# Patient Record
Sex: Male | Born: 1960 | Race: White | Hispanic: No | Marital: Single | Smoking: Never smoker
Health system: Southern US, Community
[De-identification: ages and names within clinical notes are randomized; demographics above are authoritative.]

## PROBLEM LIST (undated history)

## (undated) DIAGNOSIS — R945 Abnormal results of liver function studies: Secondary | ICD-10-CM

## (undated) DIAGNOSIS — E669 Obesity, unspecified: Secondary | ICD-10-CM

## (undated) DIAGNOSIS — R7989 Other specified abnormal findings of blood chemistry: Secondary | ICD-10-CM

## (undated) DIAGNOSIS — T7840XA Allergy, unspecified, initial encounter: Secondary | ICD-10-CM

## (undated) DIAGNOSIS — E785 Hyperlipidemia, unspecified: Secondary | ICD-10-CM

## (undated) DIAGNOSIS — I10 Essential (primary) hypertension: Secondary | ICD-10-CM

## (undated) HISTORY — DX: Abnormal results of liver function studies: R94.5

## (undated) HISTORY — DX: Hyperlipidemia, unspecified: E78.5

## (undated) HISTORY — DX: Obesity, unspecified: E66.9

## (undated) HISTORY — DX: Essential (primary) hypertension: I10

## (undated) HISTORY — DX: Allergy, unspecified, initial encounter: T78.40XA

## (undated) HISTORY — DX: Other specified abnormal findings of blood chemistry: R79.89

---

## 2005-09-14 ENCOUNTER — Encounter (INDEPENDENT_AMBULATORY_CARE_PROVIDER_SITE_OTHER): Payer: Self-pay | Admitting: Family Medicine

## 2006-01-30 ENCOUNTER — Encounter (INDEPENDENT_AMBULATORY_CARE_PROVIDER_SITE_OTHER): Payer: Self-pay | Admitting: Family Medicine

## 2006-02-01 ENCOUNTER — Ambulatory Visit: Payer: Self-pay | Admitting: Family Medicine

## 2006-02-02 ENCOUNTER — Encounter (INDEPENDENT_AMBULATORY_CARE_PROVIDER_SITE_OTHER): Payer: Self-pay | Admitting: Family Medicine

## 2006-03-02 ENCOUNTER — Ambulatory Visit: Payer: Self-pay | Admitting: Family Medicine

## 2006-03-12 ENCOUNTER — Encounter (INDEPENDENT_AMBULATORY_CARE_PROVIDER_SITE_OTHER): Payer: Self-pay | Admitting: Family Medicine

## 2006-03-12 LAB — CONVERTED CEMR LAB: RBC count: 4.97 10*6/uL

## 2006-03-26 ENCOUNTER — Encounter: Payer: Self-pay | Admitting: Family Medicine

## 2006-03-26 DIAGNOSIS — I10 Essential (primary) hypertension: Secondary | ICD-10-CM | POA: Insufficient documentation

## 2006-03-26 DIAGNOSIS — E669 Obesity, unspecified: Secondary | ICD-10-CM | POA: Insufficient documentation

## 2006-03-26 DIAGNOSIS — J309 Allergic rhinitis, unspecified: Secondary | ICD-10-CM | POA: Insufficient documentation

## 2006-03-26 DIAGNOSIS — E785 Hyperlipidemia, unspecified: Secondary | ICD-10-CM | POA: Insufficient documentation

## 2006-03-30 ENCOUNTER — Ambulatory Visit: Payer: Self-pay | Admitting: Family Medicine

## 2006-04-29 ENCOUNTER — Ambulatory Visit: Payer: Self-pay | Admitting: Family Medicine

## 2006-06-28 ENCOUNTER — Ambulatory Visit: Payer: Self-pay | Admitting: Family Medicine

## 2006-06-29 ENCOUNTER — Telehealth (INDEPENDENT_AMBULATORY_CARE_PROVIDER_SITE_OTHER): Payer: Self-pay | Admitting: *Deleted

## 2006-06-29 ENCOUNTER — Encounter (INDEPENDENT_AMBULATORY_CARE_PROVIDER_SITE_OTHER): Payer: Self-pay | Admitting: Family Medicine

## 2006-06-29 LAB — CONVERTED CEMR LAB
Creatinine, Urine: 187 mg/dL
Microalb Creat Ratio: 7.1 mg/g (ref 0.0–30.0)
Microalb, Ur: 1.32 mg/dL (ref 0.00–1.89)

## 2006-07-05 ENCOUNTER — Telehealth (INDEPENDENT_AMBULATORY_CARE_PROVIDER_SITE_OTHER): Payer: Self-pay | Admitting: Family Medicine

## 2006-07-27 ENCOUNTER — Telehealth (INDEPENDENT_AMBULATORY_CARE_PROVIDER_SITE_OTHER): Payer: Self-pay | Admitting: Family Medicine

## 2006-09-03 ENCOUNTER — Ambulatory Visit: Payer: Self-pay | Admitting: Family Medicine

## 2006-09-03 DIAGNOSIS — F17200 Nicotine dependence, unspecified, uncomplicated: Secondary | ICD-10-CM

## 2006-09-03 LAB — CONVERTED CEMR LAB
Cholesterol, target level: 200 mg/dL
LDL Goal: 100 mg/dL

## 2006-12-06 ENCOUNTER — Ambulatory Visit: Payer: Self-pay | Admitting: Family Medicine

## 2006-12-17 ENCOUNTER — Encounter (INDEPENDENT_AMBULATORY_CARE_PROVIDER_SITE_OTHER): Payer: Self-pay | Admitting: Family Medicine

## 2006-12-19 LAB — CONVERTED CEMR LAB
BUN: 18 mg/dL (ref 6–23)
CO2: 24 meq/L (ref 19–32)
Chloride: 104 meq/L (ref 96–112)
Glucose, Bld: 96 mg/dL (ref 70–99)
Potassium: 3.9 meq/L (ref 3.5–5.3)
Sodium: 141 meq/L (ref 135–145)

## 2007-01-17 ENCOUNTER — Ambulatory Visit: Payer: Self-pay | Admitting: Family Medicine

## 2007-01-19 ENCOUNTER — Telehealth (INDEPENDENT_AMBULATORY_CARE_PROVIDER_SITE_OTHER): Payer: Self-pay | Admitting: *Deleted

## 2007-03-08 ENCOUNTER — Encounter (INDEPENDENT_AMBULATORY_CARE_PROVIDER_SITE_OTHER): Payer: Self-pay | Admitting: Family Medicine

## 2007-03-10 LAB — CONVERTED CEMR LAB
AST: 19 units/L (ref 0–37)
Alkaline Phosphatase: 70 units/L (ref 39–117)
Basophils Relative: 1 % (ref 0–1)
Eosinophils Absolute: 0.1 10*3/uL (ref 0.0–0.7)
Eosinophils Relative: 2 % (ref 0–5)
Glucose, Bld: 129 mg/dL — ABNORMAL HIGH (ref 70–99)
HCT: 47.2 % (ref 39.0–52.0)
HDL: 39 mg/dL — ABNORMAL LOW (ref >39)
LDL Cholesterol: 84 mg/dL (ref 0–99)
Lymphs Abs: 2.1 10*3/uL (ref 0.7–3.3)
MCHC: 32.6 g/dL (ref 30.0–36.0)
MCV: 92 fL (ref 78.0–100.0)
Platelets: 210 10*3/uL (ref 150–400)
RDW: 13.4 % (ref 11.5–14.0)
Sodium: 142 meq/L (ref 135–145)
Total Bilirubin: 0.6 mg/dL (ref 0.3–1.2)
Total Protein: 7.5 g/dL (ref 6.0–8.3)
Triglycerides: 160 mg/dL — ABNORMAL HIGH (ref <150)
VLDL: 32 mg/dL (ref 0–40)
WBC: 5.3 10*3/uL (ref 4.0–10.5)

## 2007-03-21 ENCOUNTER — Telehealth (INDEPENDENT_AMBULATORY_CARE_PROVIDER_SITE_OTHER): Payer: Self-pay | Admitting: *Deleted

## 2007-03-24 ENCOUNTER — Ambulatory Visit: Payer: Self-pay | Admitting: Family Medicine

## 2007-06-23 ENCOUNTER — Ambulatory Visit: Payer: Self-pay | Admitting: Family Medicine

## 2007-06-24 ENCOUNTER — Telehealth (INDEPENDENT_AMBULATORY_CARE_PROVIDER_SITE_OTHER): Payer: Self-pay | Admitting: *Deleted

## 2007-06-24 ENCOUNTER — Encounter (INDEPENDENT_AMBULATORY_CARE_PROVIDER_SITE_OTHER): Payer: Self-pay | Admitting: Family Medicine

## 2007-06-24 LAB — CONVERTED CEMR LAB
BUN: 13 mg/dL (ref 6–23)
Basophils Relative: 0 % (ref 0–1)
Creatinine, Ser: 0.99 mg/dL (ref 0.40–1.50)
Eosinophils Absolute: 0.1 10*3/uL (ref 0.0–0.7)
Eosinophils Relative: 2 % (ref 0–5)
Hemoglobin: 14.5 g/dL (ref 13.0–17.0)
MCHC: 32.6 g/dL (ref 30.0–36.0)
MCV: 93.1 fL (ref 78.0–100.0)
Monocytes Absolute: 0.5 10*3/uL (ref 0.1–1.0)
Monocytes Relative: 7 % (ref 3–12)
RBC: 4.78 M/uL (ref 4.22–5.81)
TSH: 1.229 microintl units/mL (ref 0.350–5.50)

## 2007-06-27 ENCOUNTER — Encounter (INDEPENDENT_AMBULATORY_CARE_PROVIDER_SITE_OTHER): Payer: Self-pay | Admitting: Family Medicine

## 2007-07-28 ENCOUNTER — Telehealth (INDEPENDENT_AMBULATORY_CARE_PROVIDER_SITE_OTHER): Payer: Self-pay | Admitting: Family Medicine

## 2007-09-22 ENCOUNTER — Ambulatory Visit: Payer: Self-pay | Admitting: Family Medicine

## 2007-09-22 LAB — CONVERTED CEMR LAB: Hgb A1c MFr Bld: 6.6 %

## 2007-12-22 ENCOUNTER — Ambulatory Visit: Payer: Self-pay | Admitting: Family Medicine

## 2007-12-23 LAB — CONVERTED CEMR LAB
BUN: 19 mg/dL (ref 6–23)
Calcium: 9.3 mg/dL (ref 8.4–10.5)
Glucose, Bld: 218 mg/dL — ABNORMAL HIGH (ref 70–99)
Potassium: 4.2 meq/L (ref 3.5–5.3)

## 2007-12-29 ENCOUNTER — Telehealth (INDEPENDENT_AMBULATORY_CARE_PROVIDER_SITE_OTHER): Payer: Self-pay | Admitting: Family Medicine

## 2008-02-29 ENCOUNTER — Ambulatory Visit: Payer: Self-pay | Admitting: Family Medicine

## 2008-03-15 ENCOUNTER — Ambulatory Visit: Payer: Self-pay | Admitting: Family Medicine

## 2008-03-15 LAB — CONVERTED CEMR LAB: Glucose, Bld: 149 mg/dL

## 2008-03-20 ENCOUNTER — Encounter (INDEPENDENT_AMBULATORY_CARE_PROVIDER_SITE_OTHER): Payer: Self-pay | Admitting: Family Medicine

## 2008-03-22 ENCOUNTER — Telehealth (INDEPENDENT_AMBULATORY_CARE_PROVIDER_SITE_OTHER): Payer: Self-pay | Admitting: Family Medicine

## 2008-03-27 LAB — CONVERTED CEMR LAB
ALT: 28 units/L (ref 0–53)
BUN: 22 mg/dL (ref 6–23)
Basophils Absolute: 0 10*3/uL (ref 0.0–0.1)
CO2: 22 meq/L (ref 19–32)
Cholesterol: 185 mg/dL (ref 0–200)
Creatinine, Ser: 0.98 mg/dL (ref 0.40–1.50)
Eosinophils Relative: 2 % (ref 0–5)
Glucose, Bld: 118 mg/dL — ABNORMAL HIGH (ref 70–99)
HCT: 44.7 % (ref 39.0–52.0)
HDL: 45 mg/dL (ref >39)
Hemoglobin: 14.4 g/dL (ref 13.0–17.0)
Lymphocytes Relative: 38 % (ref 12–46)
Microalb Creat Ratio: 6.5 mg/g (ref 0.0–30.0)
Monocytes Absolute: 0.7 10*3/uL (ref 0.1–1.0)
Monocytes Relative: 10 % (ref 3–12)
RDW: 12.8 % (ref 11.5–15.5)
Total Bilirubin: 0.6 mg/dL (ref 0.3–1.2)
Total CHOL/HDL Ratio: 4.1
Triglycerides: 138 mg/dL (ref <150)
VLDL: 28 mg/dL (ref 0–40)

## 2008-05-03 ENCOUNTER — Ambulatory Visit: Payer: Self-pay | Admitting: Family Medicine

## 2008-06-14 ENCOUNTER — Ambulatory Visit: Payer: Self-pay | Admitting: Family Medicine

## 2008-06-21 ENCOUNTER — Encounter (INDEPENDENT_AMBULATORY_CARE_PROVIDER_SITE_OTHER): Payer: Self-pay | Admitting: Family Medicine

## 2008-06-21 ENCOUNTER — Ambulatory Visit (HOSPITAL_COMMUNITY): Admission: RE | Admit: 2008-06-21 | Discharge: 2008-06-21 | Payer: Self-pay | Admitting: Family Medicine

## 2008-06-21 ENCOUNTER — Ambulatory Visit: Payer: Self-pay | Admitting: Cardiology

## 2008-06-22 LAB — CONVERTED CEMR LAB
Albumin: 4.8 g/dL (ref 3.5–5.2)
BUN: 22 mg/dL (ref 6–23)
CO2: 25 meq/L (ref 19–32)
Calcium: 9.6 mg/dL (ref 8.4–10.5)
Chloride: 101 meq/L (ref 96–112)
Cholesterol: 161 mg/dL (ref 0–200)
Creatinine, Ser: 1.2 mg/dL (ref 0.40–1.50)
HDL: 44 mg/dL (ref >39)
Potassium: 4.4 meq/L (ref 3.5–5.3)
Total CHOL/HDL Ratio: 3.7

## 2008-07-26 ENCOUNTER — Ambulatory Visit: Payer: Self-pay | Admitting: Family Medicine

## 2008-08-23 ENCOUNTER — Ambulatory Visit: Payer: Self-pay | Admitting: Family Medicine

## 2008-08-23 LAB — CONVERTED CEMR LAB: Hgb A1c MFr Bld: 6.8 %

## 2008-11-28 ENCOUNTER — Ambulatory Visit: Payer: Self-pay | Admitting: Family Medicine

## 2008-11-28 DIAGNOSIS — E1165 Type 2 diabetes mellitus with hyperglycemia: Secondary | ICD-10-CM

## 2008-11-28 LAB — CONVERTED CEMR LAB: Glucose, Bld: 191 mg/dL

## 2009-01-10 ENCOUNTER — Ambulatory Visit: Payer: Self-pay | Admitting: Family Medicine

## 2009-01-11 ENCOUNTER — Encounter (INDEPENDENT_AMBULATORY_CARE_PROVIDER_SITE_OTHER): Payer: Self-pay | Admitting: Family Medicine

## 2009-01-11 LAB — CONVERTED CEMR LAB
Albumin: 4.9 g/dL (ref 3.5–5.2)
Alkaline Phosphatase: 51 units/L (ref 39–117)
BUN: 21 mg/dL (ref 6–23)
Calcium: 9.6 mg/dL (ref 8.4–10.5)
Creatinine, Ser: 0.89 mg/dL (ref 0.40–1.50)
Glucose, Bld: 113 mg/dL — ABNORMAL HIGH (ref 70–99)
HDL: 42 mg/dL (ref >39)
LDL Cholesterol: 71 mg/dL (ref 0–99)
Potassium: 4.2 meq/L (ref 3.5–5.3)
Triglycerides: 98 mg/dL (ref <150)

## 2009-02-05 ENCOUNTER — Encounter (INDEPENDENT_AMBULATORY_CARE_PROVIDER_SITE_OTHER): Payer: Self-pay | Admitting: Family Medicine

## 2009-02-13 ENCOUNTER — Encounter (INDEPENDENT_AMBULATORY_CARE_PROVIDER_SITE_OTHER): Payer: Self-pay | Admitting: Family Medicine

## 2009-12-29 ENCOUNTER — Emergency Department (HOSPITAL_BASED_OUTPATIENT_CLINIC_OR_DEPARTMENT_OTHER): Admission: EM | Admit: 2009-12-29 | Discharge: 2009-12-29 | Payer: Self-pay | Admitting: Emergency Medicine

## 2010-08-08 LAB — DIFFERENTIAL
Basophils Absolute: 0 10*3/uL (ref 0.0–0.1)
Basophils Relative: 0 % (ref 0–1)
Eosinophils Relative: 2 % (ref 0–5)
Monocytes Absolute: 0.5 10*3/uL (ref 0.1–1.0)

## 2010-08-08 LAB — CBC
HCT: 41 % (ref 39.0–52.0)
MCHC: 34.1 g/dL (ref 30.0–36.0)
MCV: 92 fL (ref 78.0–100.0)
RDW: 12.5 % (ref 11.5–15.5)

## 2010-08-08 LAB — BASIC METABOLIC PANEL
BUN: 24 mg/dL — ABNORMAL HIGH (ref 6–23)
Chloride: 101 mEq/L (ref 96–112)
Glucose, Bld: 163 mg/dL — ABNORMAL HIGH (ref 70–99)
Potassium: 4.3 mEq/L (ref 3.5–5.1)

## 2010-08-08 LAB — POCT CARDIAC MARKERS
CKMB, poc: 1.1 ng/mL (ref 1.0–8.0)
CKMB, poc: 1.9 ng/mL (ref 1.0–8.0)
Myoglobin, poc: 52.9 ng/mL (ref 12–200)
Troponin i, poc: <0.05 ng/mL (ref 0.00–0.09)

## 2010-12-25 ENCOUNTER — Encounter: Payer: Self-pay | Admitting: Family Medicine

## 2011-04-06 ENCOUNTER — Observation Stay (HOSPITAL_COMMUNITY)
Admission: EM | Admit: 2011-04-06 | Discharge: 2011-04-07 | Disposition: A | Discharge status: Home / Self Care | Payer: PRIVATE HEALTH INSURANCE | Attending: Internal Medicine | Admitting: Internal Medicine

## 2011-04-06 ENCOUNTER — Other Ambulatory Visit: Payer: Self-pay

## 2011-04-06 ENCOUNTER — Encounter (HOSPITAL_COMMUNITY): Payer: Self-pay | Admitting: Emergency Medicine

## 2011-04-06 ENCOUNTER — Emergency Department (HOSPITAL_COMMUNITY): Payer: PRIVATE HEALTH INSURANCE

## 2011-04-06 DIAGNOSIS — I10 Essential (primary) hypertension: Secondary | ICD-10-CM | POA: Insufficient documentation

## 2011-04-06 DIAGNOSIS — R42 Dizziness and giddiness: Secondary | ICD-10-CM | POA: Insufficient documentation

## 2011-04-06 DIAGNOSIS — R002 Palpitations: Secondary | ICD-10-CM | POA: Insufficient documentation

## 2011-04-06 DIAGNOSIS — E119 Type 2 diabetes mellitus without complications: Secondary | ICD-10-CM | POA: Insufficient documentation

## 2011-04-06 DIAGNOSIS — R0789 Other chest pain: Principal | ICD-10-CM | POA: Insufficient documentation

## 2011-04-06 LAB — COMPREHENSIVE METABOLIC PANEL
Alkaline Phosphatase: 71 U/L (ref 39–117)
BUN: 16 mg/dL (ref 6–23)
Chloride: 99 mEq/L (ref 96–112)
Creatinine, Ser: 0.86 mg/dL (ref 0.50–1.35)
GFR calc Af Amer: >90 mL/min (ref >90)
GFR calc non Af Amer: >90 mL/min (ref >90)
Glucose, Bld: 103 mg/dL — ABNORMAL HIGH (ref 70–99)
Potassium: 3.9 mEq/L (ref 3.5–5.1)
Total Bilirubin: 0.5 mg/dL (ref 0.3–1.2)

## 2011-04-06 LAB — POCT I-STAT TROPONIN I: Troponin i, poc: 0 ng/mL (ref 0.00–0.08)

## 2011-04-06 LAB — CBC
HCT: 39.7 % (ref 39.0–52.0)
Hemoglobin: 13.2 g/dL (ref 13.0–17.0)
MCH: 30.6 pg (ref 26.0–34.0)
RBC: 4.31 MIL/uL (ref 4.22–5.81)

## 2011-04-06 LAB — DIFFERENTIAL
Lymphs Abs: 2 10*3/uL (ref 0.7–4.0)
Monocytes Absolute: 0.9 10*3/uL (ref 0.1–1.0)
Monocytes Relative: 9 % (ref 3–12)
Neutro Abs: 6.1 10*3/uL (ref 1.7–7.7)
Neutrophils Relative %: 68 % (ref 43–77)

## 2011-04-06 NOTE — ED Provider Notes (Signed)
History   This chart was scribed for Kyle Lennert, MD by Clarita Crane. The patient was seen in room APA10/APA10 and the patient's care was started at 7:20PM.   CSN: [id number] Arrival date & time: 04/06/2011  6:36 PM   First MD Initiated Contact with Patient 04/06/11 1916      Chief Complaint  Patient presents with  . Chest Pain  . Weakness   HPI DARLY FAILS is a 50 y.o. male who presents to the Emergency Department complaining of sudden onset of moderate non-radiating chest pain with associated weakness, dizziness described as lightheadedness and palpitations which started approximately 3.5 hours ago and gradually improving since. States palpitations have currently resolved but notes dizziness persists and is worse with standing. Patient states he had his blood pressure measured following onset of chest pain which was 140/80. Denies diaphoresis, fever, nausea, vomiting, diarrhea. Patient with h/o hypertension, diabetes, hyperlipidemia. Notes father died of MI at age of 34.   PCP- Dr. Tanya Nones  Past Medical History  Diagnosis Date  . Hypertension   . Diabetes mellitus   . Hyperlipidemia     History reviewed. No pertinent past surgical history.  History reviewed. No pertinent family history.  History  Substance Use Topics  . Smoking status: Not on file  . Smokeless tobacco: Current User  . Alcohol Use: No      Review of Systems  Constitutional: Negative for fatigue.  HENT: Negative for congestion, sinus pressure and ear discharge.   Eyes: Negative for discharge.  Respiratory: Negative for cough.   Cardiovascular: Negative for chest pain.  Gastrointestinal: Negative for abdominal pain and diarrhea.  Genitourinary: Negative for frequency and hematuria.  Musculoskeletal: Negative for back pain.  Skin: Negative for rash.  Neurological: Negative for seizures and headaches.  Hematological: Negative.   Psychiatric/Behavioral: Negative for hallucinations.     Allergies  Review of patient's allergies indicates no known allergies.  Home Medications   Current Outpatient Rx  Name Route Sig Dispense Refill  . ASPIRIN 81 MG PO TABS Oral Take 81 mg by mouth at bedtime.     Marland Kitchen VITAMIN D 1000 UNITS PO TABS Oral Take 1,000 Units by mouth at bedtime.      . OMEGA-3 FATTY ACIDS 1000 MG PO CAPS Oral Take 1 g by mouth 2 (two) times daily.     Marland Kitchen GLIPIZIDE 5 MG PO TABS Oral Take 10 mg by mouth 2 (two) times daily before a meal.     . LISINOPRIL 20 MG PO TABS Oral Take 20 mg by mouth daily.      Marland Kitchen METFORMIN HCL 1000 MG PO TABS Oral Take 1,000 mg by mouth 2 (two) times daily with a meal.      . OXYMETAZOLINE HCL 0.05 % NA SOLN Nasal Place 2 sprays into the nose once. For nasal congestion     . PRAVASTATIN SODIUM 20 MG PO TABS Oral Take 20 mg by mouth daily.      Marland Kitchen VITAMIN B-6 100 MG PO TABS Oral Take 100 mg by mouth daily.      Marland Kitchen SITAGLIPTIN PHOSPHATE 100 MG PO TABS Oral Take 100 mg by mouth every evening.     Marland Kitchen VITAMIN C 500 MG PO TABS Oral Take 500 mg by mouth at bedtime.        BP 120/76  Pulse 74  Temp(Src) 97.5 F (36.4 C) (Oral)  Resp 20  Ht 5\' 10"  (1.778 m)  Wt 225 lb (102.059 kg)  BMI  32.28 kg/m2  SpO2 100%  Physical Exam  Nursing note and vitals reviewed. Constitutional: He is oriented to person, place, and time. He appears well-developed and well-nourished. No distress.  HENT:  Head: Normocephalic and atraumatic.  Eyes: EOM are normal. Pupils are equal, round, and reactive to light.  Neck: Neck supple.  Cardiovascular: Normal rate, regular rhythm and normal heart sounds.  Exam reveals no gallop and no friction rub.   No murmur heard. Pulmonary/Chest: Effort normal and breath sounds normal. No respiratory distress. He has no wheezes.  Abdominal: Soft. Bowel sounds are normal. He exhibits no distension. There is no tenderness.  Musculoskeletal: Normal range of motion. He exhibits no edema.  Neurological: He is alert and oriented to  person, place, and time. No sensory deficit.  Skin: Skin is warm and dry.  Psychiatric: He has a normal mood and affect. His behavior is normal.    ED Course  Procedures  DIAGNOSTIC STUDIES: Oxygen Saturation is 100% on room air, normal by my interpretation.     Date: 04/06/2011  Rate: 60  Rhythm: normal sinus rhythm  QRS Axis: normal  Intervals: normal  ST/T Wave abnormalities: normal  Conduction Disutrbances:none  Narrative Interpretation:   Old EKG Reviewed: none available   COORDINATION OF CARE: 10:17PM- Patient informed of current lab and imaging results. Orthostatic vital signs reviewed and explained to patient. Patient advised of intent to admit. Patient agrees with plan set forth at this time.  10:25PM- Consult complete with hospitalist. Patient case explained and discussed. Hospitalist agrees to admit patient for further evaluation and treatment.   Labs Reviewed  COMPREHENSIVE METABOLIC PANEL - Abnormal; Notable for the following:    Glucose, Bld 103 (*)    AST 43 (*)    ALT 60 (*)    All other components within normal limits  CBC  DIFFERENTIAL  POCT I-STAT TROPONIN I  I-STAT TROPONIN I   Dg Chest 2 View  04/06/2011  *RADIOLOGY REPORT*  Clinical Data: Chest pain  CHEST - 2 VIEW  Comparison: 06/21/2008  Findings: Heart size within normal limits.  Negative for heart failure.  Lungs are clear without infiltrate or effusion.  IMPRESSION: No active cardiopulmonary disease.  Original Report Authenticated By: Camelia Phenes, M.D.     1. Chest pain       MDM     The chart was scribed for me under my direct supervision.  I personally performed the history, physical, and medical decision making and all procedures in the evaluation of this patient.Kyle Lennert, MD 04/06/11 2236

## 2011-04-06 NOTE — ED Notes (Signed)
Pt c/o chest pain, dizziness and weakness since 4pm today.

## 2011-04-07 ENCOUNTER — Encounter (HOSPITAL_COMMUNITY): Payer: Self-pay | Admitting: *Deleted

## 2011-04-07 LAB — LIPID PANEL
Cholesterol: 132 mg/dL (ref 0–200)
VLDL: 22 mg/dL (ref 0–40)

## 2011-04-07 LAB — CARDIAC PANEL(CRET KIN+CKTOT+MB+TROPI)
CK, MB: 5.1 ng/mL — ABNORMAL HIGH (ref 0.3–4.0)
CK, MB: 4.7 ng/mL — ABNORMAL HIGH (ref 0.3–4.0)
Troponin I: <0.3 ng/mL (ref <0.30)
Troponin I: <0.3 ng/mL (ref <0.30)
Troponin I: <0.3 ng/mL (ref <0.30)

## 2011-04-07 LAB — GLUCOSE, CAPILLARY
Glucose-Capillary: 198 mg/dL — ABNORMAL HIGH (ref 70–99)
Glucose-Capillary: 139 mg/dL — ABNORMAL HIGH (ref 70–99)

## 2011-04-07 LAB — TSH: TSH: 0.879 u[IU]/mL (ref 0.350–4.500)

## 2011-04-07 MED ORDER — SODIUM CHLORIDE 0.9 % IJ SOLN
3.0000 mL | Freq: Two times a day (BID) | INTRAMUSCULAR | Status: DC
  Administered 2011-04-07: 3 mL via INTRAVENOUS
  Filled 2011-04-07: qty 3

## 2011-04-07 MED ORDER — VITAMIN D3 25 MCG (1000 UNIT) PO TABS
1000.0000 [IU] | ORAL_TABLET | Freq: Every day | ORAL | Status: DC
  Filled 2011-04-07: qty 1

## 2011-04-07 MED ORDER — GLIPIZIDE 5 MG PO TABS
10.0000 mg | ORAL_TABLET | Freq: Two times a day (BID) | ORAL | Status: DC
  Administered 2011-04-07: 10 mg via ORAL
  Filled 2011-04-07: qty 2

## 2011-04-07 MED ORDER — ASPIRIN 300 MG RE SUPP
300.0000 mg | RECTAL | Status: AC
  Filled 2011-04-07: qty 1

## 2011-04-07 MED ORDER — NITROGLYCERIN 0.4 MG SL SUBL: 0.4000 mg | SUBLINGUAL_TABLET | SUBLINGUAL | Status: DC | PRN

## 2011-04-07 MED ORDER — ONDANSETRON HCL 4 MG/2ML IJ SOLN: 4.0000 mg | Freq: Four times a day (QID) | INTRAMUSCULAR | Status: DC | PRN

## 2011-04-07 MED ORDER — ASPIRIN EC 81 MG PO TBEC: 81.0000 mg | DELAYED_RELEASE_TABLET | Freq: Every day | ORAL | Status: DC

## 2011-04-07 MED ORDER — LISINOPRIL 10 MG PO TABS
20.0000 mg | ORAL_TABLET | Freq: Every day | ORAL | Status: DC
  Administered 2011-04-07: 20 mg via ORAL
  Filled 2011-04-07: qty 2

## 2011-04-07 MED ORDER — SODIUM CHLORIDE 0.9 % IJ SOLN
3.0000 mL | INTRAMUSCULAR | Status: DC | PRN
  Administered 2011-04-07: 3 mL via INTRAVENOUS
  Filled 2011-04-07: qty 3

## 2011-04-07 MED ORDER — CYCLOBENZAPRINE HCL 5 MG PO TABS: 5.0000 mg | ORAL_TABLET | Freq: Every day | ORAL | Status: AC

## 2011-04-07 MED ORDER — ASPIRIN 81 MG PO CHEW
324.0000 mg | CHEWABLE_TABLET | ORAL | Status: AC
  Administered 2011-04-07: 324 mg via ORAL
  Filled 2011-04-07: qty 3

## 2011-04-07 MED ORDER — SIMVASTATIN 10 MG PO TABS: 5.0000 mg | ORAL_TABLET | Freq: Every day | ORAL | Status: DC

## 2011-04-07 MED ORDER — ACETAMINOPHEN 325 MG PO TABS: 650.0000 mg | ORAL_TABLET | ORAL | Status: DC | PRN

## 2011-04-07 NOTE — Discharge Summary (Signed)
  Physician Discharge Summary  Patient ID: Kyle Ray MRN: [id number] DOB/AGE: 50-Jun-1962 50 y.o.  Admit date: 04/06/2011 Discharge date: 04/07/2011  Primary Care Physician:  Franchot Heidelberg, MD   Discharge Diagnoses:    1. Chest pain, likely musculoskeletal 2. HTN 3. DM type 2   Current Discharge Medication List    START taking these medications   Details  cyclobenzaprine (FLEXERIL) 5 MG tablet Take 1 tablet (5 mg total) by mouth at bedtime. Qty: 3 tablet, Refills: 0      CONTINUE these medications which have NOT CHANGED   Details  aspirin 81 MG tablet Take 81 mg by mouth at bedtime.     cholecalciferol (VITAMIN D) 1000 UNITS tablet Take 1,000 Units by mouth at bedtime.      fish oil-omega-3 fatty acids 1000 MG capsule Take 1 g by mouth 2 (two) times daily.     glipiZIDE (GLUCOTROL) 5 MG tablet Take 10 mg by mouth 2 (two) times daily before a meal.     lisinopril (PRINIVIL,ZESTRIL) 20 MG tablet Take 20 mg by mouth daily.      metFORMIN (GLUCOPHAGE) 1000 MG tablet Take 1,000 mg by mouth 2 (two) times daily with a meal.      oxymetazoline (NASAL SPRAY 12 HOUR) 0.05 % nasal spray Place 2 sprays into the nose once. For nasal congestion     pravastatin (PRAVACHOL) 20 MG tablet Take 20 mg by mouth daily.      pyridOXINE (VITAMIN B-6) 100 MG tablet Take 100 mg by mouth daily.      sitaGLIPtin (JANUVIA) 100 MG tablet Take 100 mg by mouth every evening.     vitamin C (ASCORBIC ACID) 500 MG tablet Take 500 mg by mouth at bedtime.           Disposition and Follow-up:  Patient will be discharged home today in stable and improved condition. He should schedule a followup appointment with his primary care physician in 4 weeks following discharge.  Consults:  none    Significant Diagnostic Studies:  Dg Chest 2 View  04/06/2011  *RADIOLOGY REPORT*  Clinical Data: Chest pain  CHEST - 2 VIEW  Comparison: 06/21/2008  Findings: Heart size within normal limits.   Negative for heart failure.  Lungs are clear without infiltrate or effusion.  IMPRESSION: No active cardiopulmonary disease.  Original Report Authenticated By: Camelia Phenes, M.D.    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    Hospital Course:  #1 chest pain: He has ruled out for acute coronary syndrome by the way of 3 sets of negative cardiac enzymes and EKGs that demonstrate no acute ischemic changes. Chest pain is in the axillary region and is exquisitely tender to touch. I believe that this is most likely related to a muscle spasm related to the heavy lifting that is required at his job. I will give him a muscle relaxer to take every night for 3 consecutive nights. I do not believe he needs any further cardiac workup at this time.  Time spent on Discharge: Greater than 30 minutes.  SignedChaya Jan 04/07/2011, 2:13 PM

## 2011-04-07 NOTE — Plan of Care (Signed)
Problem: Consults Goal: Diabetes Guidelines if Diabetic/Glucose > 140 If diabetic or lab glucose is > 140 mg/dl - Initiate Diabetes/Hyperglycemia Guidelines & Document Interventions  Outcome: Progressing Recommendations made.  Will continue to monitor.     

## 2011-04-07 NOTE — H&P (Signed)
PCP:   Franchot Heidelberg, MD   Chief Complaint:  cp  HPI: 50 yo male dm/htn bringing his trash to the road and started having some dizziness, left sided cp that he thought was from a pulled muscle without any radiation and having palp.  He says he went inside sat down and the sympoms resolved.  Denies and n/v/sob with it.  deneis any recent fevers/n/v/d.  Has had several episodes similar to this in the past which he thinks may be related to low blood sugar but he is not sure bc he never checks it.  Symptoms resolved at this time.  Review of Systems:  O/w neg  Past Medical History: Past Medical History  Diagnosis Date  . Hypertension   . Diabetes mellitus   . Hyperlipidemia    History reviewed. No pertinent past surgical history.  Medications: Prior to Admission medications   Medication Sig Start Date End Date Taking? Authorizing Provider  aspirin 81 MG tablet Take 81 mg by mouth at bedtime.    Yes Historical Provider, MD  cholecalciferol (VITAMIN D) 1000 UNITS tablet Take 1,000 Units by mouth at bedtime.     Yes Historical Provider, MD  fish oil-omega-3 fatty acids 1000 MG capsule Take 1 g by mouth 2 (two) times daily.    Yes Historical Provider, MD  glipiZIDE (GLUCOTROL) 5 MG tablet Take 10 mg by mouth 2 (two) times daily before a meal.    Yes Historical Provider, MD  lisinopril (PRINIVIL,ZESTRIL) 20 MG tablet Take 20 mg by mouth daily.     Yes Historical Provider, MD  metFORMIN (GLUCOPHAGE) 1000 MG tablet Take 1,000 mg by mouth 2 (two) times daily with a meal.     Yes Historical Provider, MD  oxymetazoline (NASAL SPRAY 12 HOUR) 0.05 % nasal spray Place 2 sprays into the nose once. For nasal congestion    Yes Historical Provider, MD  pravastatin (PRAVACHOL) 20 MG tablet Take 20 mg by mouth daily.     Yes Historical Provider, MD  pyridOXINE (VITAMIN B-6) 100 MG tablet Take 100 mg by mouth daily.     Yes Historical Provider, MD  sitaGLIPtin (JANUVIA) 100 MG tablet Take 100 mg by  mouth every evening.    Yes Historical Provider, MD  vitamin C (ASCORBIC ACID) 500 MG tablet Take 500 mg by mouth at bedtime.     Yes Historical Provider, MD    Allergies:  No Known Allergies  Social History:  does not have a smoking history on file. He uses smokeless tobacco. He reports that he does not drink alcohol or use illicit drugs.  Family History: History reviewed. No pertinent family history.  Physical Exam: Filed Vitals:   04/06/11 1833 04/06/11 1949 04/06/11 1951 04/06/11 1953  BP: 156/84 140/77 141/80 120/76  Pulse: 71 60 66 74  Temp: 97.5 F (36.4 C)     TempSrc: Oral     Resp: 20     Height: 5\' 10"  (1.778 m)     Weight: 102.059 kg (225 lb)     SpO2: 100%      General appearance: alert, cooperative and no distress Resp: clear to auscultation bilaterally Chest wall: no tenderness Cardio: regular rate and rhythm, S1, S2 normal, no murmur, click, rub or gallop GI: soft, non-tender; bowel sounds normal; no masses,  no organomegaly Extremities: extremities normal, atraumatic, no cyanosis or edema Pulses: 2+ and symmetric Skin: Skin color, texture, turgor normal. No rashes or lesions Neurologic: Grossly normal   Labs on Admission:  Basename 04/06/11 1935  NA 137  K 3.9  CL 99  CO2 27  GLUCOSE 103*  BUN 16  CREATININE 0.86  CALCIUM 9.9  MG --  PHOS --    Basename 04/06/11 1935  AST 43*  ALT 60*  ALKPHOS 71  BILITOT 0.5  PROT 8.1  ALBUMIN 4.8    Basename 04/06/11 1935  WBC 9.0  NEUTROABS 6.1  HGB 13.2  HCT 39.7  MCV 92.1  PLT 168   Radiological Exams on Admission: Dg Chest 2 View  04/06/2011  *RADIOLOGY REPORT*  Clinical Data: Chest pain  CHEST - 2 VIEW  Comparison: 06/21/2008  Findings: Heart size within normal limits.  Negative for heart failure.  Lungs are clear without infiltrate or effusion.  IMPRESSION: No active cardiopulmonary disease.  Original Report Authenticated By: Camelia Phenes, M.D.    Assessment/Plan Present on  Admission:  50 yo male with atypical cp, dizzy, palpitations 1.  Cp  ekg neg.  No arrythmias.  Will monitor on tele for any.  Romi, ck 2d echo and will need complete w/u with outpt stress testing due to risk factors.  obs overnight.   2.  htn stable 3.  Dm stable 4.  hld stable  Orthostatics neg in ED.    Ashleynicole Mcclees A 04/07/2011, 12:58 AM

## 2011-04-07 NOTE — Progress Notes (Signed)
UR Chart Review Completed  

## 2011-04-07 NOTE — ED Notes (Signed)
Pt CP free at this time A&O no c/os pt kept informed

## 2011-04-07 NOTE — Progress Notes (Signed)
Inpatient Diabetes Program Recommendations  AACE/ADA: New Consensus Statement on Inpatient Glycemic Control (2009)  Target Ranges:  Prepandial:   less than 140 mg/dL      Peak postprandial:   less than 180 mg/dL (1-2 hours)      Critically ill patients:  140 - 180 mg/dL   Reason for Visit: Elevated glucose:  198 mg/dL  Inpatient Diabetes Program Recommendations Correction (SSI): Add Novolog Correction HgbA1C: Check A1C to assess glycemic control.

## 2011-10-02 LAB — HEPATIC FUNCTION PANEL
ALT: 63 U/L — AB (ref 10–40)
AST: 45 U/L — AB (ref 14–40)
Alkaline Phosphatase: 57 U/L (ref 25–125)

## 2011-10-02 LAB — LIPID PANEL
HDL: 40 mg/dL (ref 35–70)
LDL Cholesterol: 92 mg/dL

## 2011-12-03 ENCOUNTER — Telehealth (HOSPITAL_COMMUNITY): Payer: Self-pay | Admitting: Dietician

## 2011-12-03 ENCOUNTER — Ambulatory Visit (INDEPENDENT_AMBULATORY_CARE_PROVIDER_SITE_OTHER): Payer: PRIVATE HEALTH INSURANCE | Admitting: Family Medicine

## 2011-12-03 ENCOUNTER — Encounter: Payer: Self-pay | Admitting: Family Medicine

## 2011-12-03 VITALS — BP 120/80 | HR 72 | Resp 16 | Ht 69.0 in | Wt 233.4 lb

## 2011-12-03 DIAGNOSIS — IMO0001 Reserved for inherently not codable concepts without codable children: Secondary | ICD-10-CM

## 2011-12-03 DIAGNOSIS — I10 Essential (primary) hypertension: Secondary | ICD-10-CM

## 2011-12-03 DIAGNOSIS — E669 Obesity, unspecified: Secondary | ICD-10-CM

## 2011-12-03 DIAGNOSIS — E785 Hyperlipidemia, unspecified: Secondary | ICD-10-CM

## 2011-12-03 NOTE — Patient Instructions (Signed)
Stop the pravastatin  Continue your other medications I will set you up with the nutritionist at [facility] Try to watch the sweets, avoid white foods " bread,pasta,potatoes, french fries, rice,cookies"  Multivitamin once a day  F/U 6 weeks - bring your meter

## 2011-12-03 NOTE — Assessment & Plan Note (Addendum)
On 4 oral meds, obtain records, concern his A1C is not well controlled. Continue for now One ACEI and aspirin

## 2011-12-03 NOTE — Assessment & Plan Note (Signed)
Nutrition referral, he works long shifts, encouraged walking

## 2011-12-03 NOTE — Telephone Encounter (Signed)
Received referral via fax from [facility] (Dr. Jeanice Lim) for dx: diabetes, obesity.

## 2011-12-03 NOTE — Assessment & Plan Note (Signed)
Well controlled continue Lisinopril

## 2011-12-03 NOTE — Assessment & Plan Note (Signed)
Continue to hold statin until records and labs reviewed, on fish oil

## 2011-12-03 NOTE — Telephone Encounter (Signed)
Appointment scheduled for Friday 12/11/11 at 10:00 AM.

## 2011-12-03 NOTE — Progress Notes (Signed)
  Subjective:    Patient ID: Kyle Ray, male    DOB: May 20, 1961, 51 y.o.   MRN: [id number]  HPI Pt here to establish care , previous PCP [facility] Medications and history reviewed Ortho- Dr. Amanda Pea , [facility] DM- diagnosed 10 years ago after routine work physical, on 4 different oral meds, takes CBG twice a week, Actos added 2 months ago Fatty liver- told he has elevated LFT, was on pravastatin has been off for about 2 months now, had labs done 4 weeks ago, trying to improve his diet, asking for nutritionist referral today Hand pain- s/p surgery for trigger finger in March of 2013 continues to have stiffness and pain in right hand, also has ? Carpal tunnel  Due for colonoscopy   Review of Systems   GEN- denies fatigue, fever, weight loss,weakness, recent illness HEENT- denies eye drainage, change in vision, nasal discharge, CVS- denies chest pain, palpitations RESP- denies SOB, cough, wheeze ABD- denies N/V, change in stools, abd pain GU- denies dysuria, hematuria, dribbling, incontinence MSK- denies joint pain, muscle aches, injury, + right hand pain  Neuro- denies headache, +dizzy episodes, syncope, seizure activity      Objective:   Physical Exam GEN- NAD, alert and oriented x3, obese HEENT- PERRL, EOMI, non injected sclera, pink conjunctiva, MMM, oropharynx clear Neck- Supple,  CVS- RRR, no murmur RESP-CTAB ABD-NABS,soft,ND,NT EXT- No edema Pulses- Radial, DP- 2+ Psych-normal affect and Mood        Assessment & Plan:

## 2011-12-04 NOTE — Telephone Encounter (Signed)
Mailed appointment confirmation letter and instructions for appointment scheduled 12/11/11 at 10 AM via [place] Mail.

## 2011-12-11 ENCOUNTER — Encounter (HOSPITAL_COMMUNITY): Payer: Self-pay | Admitting: Dietician

## 2011-12-11 NOTE — Progress Notes (Signed)
Outpatient Initial Nutrition Assessment  Date:12/11/2011   Time: 9:27 AM  Referring Physician: Dr. Jeanice Lim Reason for Visit: diabetes  Nutrition Assessment:  Height: 5\' 9"  (175.3 cm)   Weight: 230 lb (104.327 kg)   IBW: 160# %IBW: 144% UBW: 230# %UBW: 100% Body mass index is 33.96 kg/(m^2).  Goal Weight: 207# (10% weight loss) Weight hx: Pt reports his highest weight was 280# in the 1990's. He reports he was successfully able to lose weight and achieved a weight of 190#. His lowest weights were 145# in 1980, prior to joining [employer], and 175# in 1983 after leaving the [employer].   Estimated nutritional needs: 2239-2442 kcals daily, 84-105 grams protein daily, 2.2-2.4 L fluid daily  PMH:  Past Medical History  Diagnosis Date  . Hypertension   . Diabetes mellitus   . Hyperlipidemia     Medications:  Current Outpatient Rx  Name Route Sig Dispense Refill  . ASPIRIN 81 MG PO TABS Oral Take 81 mg by mouth at bedtime.     Marland Kitchen VITAMIN D 1000 UNITS PO TABS Oral Take 1,000 Units by mouth at bedtime.      . OMEGA-3 FATTY ACIDS 1000 MG PO CAPS Oral Take 1 g by mouth 2 (two) times daily.     Marland Kitchen GLIPIZIDE 5 MG PO TABS Oral Take 10 mg by mouth 2 (two) times daily before a meal.     . LISINOPRIL 20 MG PO TABS Oral Take 20 mg by mouth daily.      Marland Kitchen METFORMIN HCL 1000 MG PO TABS Oral Take 1,000 mg by mouth 2 (two) times daily with a meal.      . OXYMETAZOLINE HCL 0.05 % NA SOLN Nasal Place 2 sprays into the nose once. For nasal congestion     . PRAVASTATIN SODIUM 20 MG PO TABS Oral Take 20 mg by mouth daily.      Marland Kitchen VITAMIN B-6 100 MG PO TABS Oral Take 100 mg by mouth daily.      Marland Kitchen SITAGLIPTIN PHOSPHATE 100 MG PO TABS Oral Take 100 mg by mouth every evening.     Marland Kitchen VITAMIN C 500 MG PO TABS Oral Take 500 mg by mouth at bedtime.        Labs: CMP     Component Value Date/Time   NA 137 04/06/2011 1935   K 3.9 04/06/2011 1935   CL 99 04/06/2011 1935   CO2 27 04/06/2011 1935   GLUCOSE 103*  04/06/2011 1935   BUN 16 04/06/2011 1935   CREATININE 0.86 04/06/2011 1935   CALCIUM 9.9 04/06/2011 1935   PROT 8.1 04/06/2011 1935   ALBUMIN 4.8 04/06/2011 1935   AST 43* 04/06/2011 1935   ALT 60* 04/06/2011 1935   ALKPHOS 71 04/06/2011 1935   BILITOT 0.5 04/06/2011 1935   GFRNONAA >90 04/06/2011 1935   GFRAA >90 04/06/2011 1935    Lipid Panel     Component Value Date/Time   CHOL 132 04/07/2011 0217   TRIG 112 04/07/2011 0217   HDL 41 04/07/2011 0217   CHOLHDL 3.2 04/07/2011 0217   VLDL 22 04/07/2011 0217   LDLCALC 69 04/07/2011 0217     Lab Results  Component Value Date   HGBA1C 8.3 11/28/2008   HGBA1C 6.8 08/23/2008   HGBA1C 6.9 06/14/2008   Lab Results  Component Value Date   MICROALBUR 1.82 03/20/2008   LDLCALC 69 04/07/2011   CREATININE 0.86 04/06/2011     Lifestyle/ social habits: Mr. Mutch lives in [place]  with a roommate. 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 He has been divorced twice. He reports his stress level at a 7, citing depression and health concerns as his main sources of stress. He does not currently participate in physical activity outside of work. 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   Nutrition hx/habits: Mr. Carrick reports that he has not changed his eating habits recently. He reports he gave up pizza 5 months ago and he started ordering salads instead of fries. He drinks mostly water or diet soda. He has a pretty consistent meal schedule. He bring his meals from home to work. He reports he will often eat out on his days off. He is interested in knowing how many sugars are in different types of food. He is adamant that his doctors told him not to eat potato or corn products and that he needs to eat foods that contain less than 6 grams of sugar. He checks his blood sugars twice a week and readings averages under 120. He reports he only checks his blood sugars when he is  off, because he does not have time at work to check them. He reports he has tried counting carbs in the past and that does not work for him.   Diet recall: breakfast (4:15 AM): banana, low sugar quaker granola bar; 7:oo AM: 1 sandwich, sugar free pudding, potato chips; 10:30 Am: sandwich, bag of chips, grape tomatoes; Dinner (6:00 PM): frozen fish and steamed vegetables  Nutrition Diagnosis: Nutrition-related knowledge deficit r/t diabetes AEB pt with multiple diet related questions.   Nutrition Intervention: Nutrition rx:1800 kcal NAS, diabetic diet; 3 meals per day (60-75 grams carbohydrates per meal); 1-2 snack daily (15-30 grams carbohydrate per snack); low calorie beverages only; physical activity as tolerated  Education/Counseling Provided: Educated pt on diabetic diet. Emphasized plate method, portion control, and sources of carbohydrate. Discussed nutritional content of commonly eaten foods and suggested healthier alternatives, particularly at restaurants. Discussed snack ideas. Reviewed food label reading. Provided plate method, whole grains, and carb counting and meal planning handouts.  Understanding, Motivation, Ability to Follow Recommendations: Expect fair compliance.  Monitoring and Evaluation: Goals: 1) 1-2# weight loss per week; 2) Hgb A1c <7.0; 3) Physical activity as tolerated  Recommendations: 1) For weight loss: 1739-1942 kcals daily; 2) Try to exercise on days off; 3) Bring pre-portioned snacks (ex. Yogurt, fruit cups) to ensure proper portion sizes  F/U: PRN. Provided RD contact information.   Melody Haver, RD, LDN 12/11/2011  Appt End Time: 11:00 AM

## 2012-01-14 ENCOUNTER — Encounter: Payer: Self-pay | Admitting: Family Medicine

## 2012-01-14 ENCOUNTER — Ambulatory Visit (INDEPENDENT_AMBULATORY_CARE_PROVIDER_SITE_OTHER): Payer: PRIVATE HEALTH INSURANCE | Admitting: Family Medicine

## 2012-01-14 VITALS — BP 110/78 | HR 65 | Resp 16 | Ht 69.0 in | Wt 233.0 lb

## 2012-01-14 DIAGNOSIS — I10 Essential (primary) hypertension: Secondary | ICD-10-CM

## 2012-01-14 DIAGNOSIS — R7989 Other specified abnormal findings of blood chemistry: Secondary | ICD-10-CM

## 2012-01-14 DIAGNOSIS — IMO0001 Reserved for inherently not codable concepts without codable children: Secondary | ICD-10-CM

## 2012-01-14 DIAGNOSIS — E785 Hyperlipidemia, unspecified: Secondary | ICD-10-CM

## 2012-01-14 DIAGNOSIS — E669 Obesity, unspecified: Secondary | ICD-10-CM

## 2012-01-14 DIAGNOSIS — E119 Type 2 diabetes mellitus without complications: Secondary | ICD-10-CM

## 2012-01-14 LAB — COMPREHENSIVE METABOLIC PANEL
AST: 33 U/L (ref 0–37)
Albumin: 4.7 g/dL (ref 3.5–5.2)
Alkaline Phosphatase: 58 U/L (ref 39–117)
BUN: 17 mg/dL (ref 6–23)
Creat: 1.03 mg/dL (ref 0.50–1.35)
Potassium: 4.3 mEq/L (ref 3.5–5.3)
Total Bilirubin: 0.8 mg/dL (ref 0.3–1.2)

## 2012-01-14 LAB — HEMOGLOBIN A1C: Hgb A1c MFr Bld: 6 % — ABNORMAL HIGH (ref <5.7)

## 2012-01-14 NOTE — Assessment & Plan Note (Signed)
Continue to f/u with nutritionist as needed, given new diet/food choices, increase activity

## 2012-01-14 NOTE — Patient Instructions (Signed)
[facility] for diabetic shoes  You can start B12 Get labs done today we will call with results Continue to work on your diet  Handout for colonoscopy given  F/U 3 months

## 2012-01-14 NOTE — Progress Notes (Signed)
  Subjective:    Patient ID: Kyle Ray, male    DOB: 1960-12-02, 51 y.o.   MRN: [id number]  HPI Patient here for interim visit for his diabetes mellitus. He was seen 6 weeks ago for new patient appointment. His labs were reviewed from his previous PCP however they were from may of 2013. His A1c was 7.7% LDL 92 total cholesterol 155. His metabolic panel showed elevated liver function tests with AST 45 ALT 63 this is thought to be secondary to fatty liver disease. He has been off of his pravastatin for the past few months. He's had negative hepatitis studies. He needs a new meter today, last CBG checked was 117 fasting He has met with nutrionist   Review of Systems   GEN- denies fatigue, fever, weight loss,weakness, recent illness CVS- denies chest pain, palpitations RESP- denies SOB, cough, wheeze ABD- denies N/V, change in stools, abd pain MSK- + joint pain, muscle aches, injury       Objective:   Physical Exam GEN- NAD, alert and oriented x3, obese CVS- RRR, no murmur RESP-CTAB EXT- No edema Pulses- Radial, DP- 2+        Assessment & Plan:

## 2012-01-14 NOTE — Assessment & Plan Note (Signed)
A1C uncontrolled at 7.7, currently on oral meds, repeat A1C today, will decide if addition of insulin is needed If he seriously commits to diet and nutritionist recommendations he will improve his diabetes control

## 2012-01-14 NOTE — Assessment & Plan Note (Signed)
Well controlled 

## 2012-01-14 NOTE — Assessment & Plan Note (Signed)
Controlled off medications As his LFT are elevated will have him use fish oil

## 2012-04-15 ENCOUNTER — Ambulatory Visit: Payer: PRIVATE HEALTH INSURANCE | Admitting: Family Medicine

## 2012-09-15 ENCOUNTER — Other Ambulatory Visit (INDEPENDENT_AMBULATORY_CARE_PROVIDER_SITE_OTHER): Payer: PRIVATE HEALTH INSURANCE

## 2012-09-15 ENCOUNTER — Encounter: Payer: Self-pay | Admitting: Family Medicine

## 2012-09-15 DIAGNOSIS — E119 Type 2 diabetes mellitus without complications: Secondary | ICD-10-CM

## 2012-09-15 DIAGNOSIS — Z0000 Encounter for general adult medical examination without abnormal findings: Secondary | ICD-10-CM

## 2012-09-16 ENCOUNTER — Ambulatory Visit (INDEPENDENT_AMBULATORY_CARE_PROVIDER_SITE_OTHER): Payer: PRIVATE HEALTH INSURANCE | Admitting: Physician Assistant

## 2012-09-16 ENCOUNTER — Other Ambulatory Visit: Payer: Self-pay

## 2012-09-16 ENCOUNTER — Encounter: Payer: Self-pay | Admitting: Physician Assistant

## 2012-09-16 VITALS — BP 120/70 | HR 88 | Temp 97.2°F | Resp 18 | Ht 66.0 in | Wt 250.0 lb

## 2012-09-16 DIAGNOSIS — R7989 Other specified abnormal findings of blood chemistry: Secondary | ICD-10-CM

## 2012-09-16 DIAGNOSIS — I10 Essential (primary) hypertension: Secondary | ICD-10-CM

## 2012-09-16 DIAGNOSIS — E785 Hyperlipidemia, unspecified: Secondary | ICD-10-CM

## 2012-09-16 DIAGNOSIS — E669 Obesity, unspecified: Secondary | ICD-10-CM

## 2012-09-16 MED ORDER — PIOGLITAZONE HCL 45 MG PO TABS: 45.0000 mg | ORAL_TABLET | Freq: Every day | ORAL | Status: DC

## 2012-09-17 LAB — LIPID PANEL
LDL Cholesterol: 116 mg/dL — ABNORMAL HIGH (ref 0–99)
Total CHOL/HDL Ratio: 4.1 Ratio
VLDL: 20 mg/dL (ref 0–40)

## 2012-09-17 LAB — COMPLETE METABOLIC PANEL WITH GFR
AST: 29 U/L (ref 0–37)
Albumin: 4.2 g/dL (ref 3.5–5.2)
Alkaline Phosphatase: 53 U/L (ref 39–117)
Potassium: 4.7 mEq/L (ref 3.5–5.3)
Sodium: 139 mEq/L (ref 135–145)
Total Bilirubin: 0.7 mg/dL (ref 0.3–1.2)
Total Protein: 7 g/dL (ref 6.0–8.3)

## 2012-09-17 NOTE — Progress Notes (Signed)
Patient ID: QUINTAVIOUS RINCK MRN: [id number], DOB: 07-Jan-1961, 52 y.o. Date of Encounter: 09/17/2012, 10:01 AM   Chief Complaint: Diabetes, Hypertension, Hyperlipidemia Follow Up  HPI: 52 y.o. year old male with history below presents for follow up. His last ROV here was 09/2011. Since then, he says he had to go to different places secondary to insurance issues. He saw Dr. Jeanice Lim twice and went to [facility] once.  Diabetes:Taking medications as directed without adverse effects. No polyuria or   polydipsia. Blood sugars at home: He is not checking routinely Diet consists of: He is not compliant with diet.  Exercise: Does not exercise. 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 No lower extremity swelling.  Hyperlipidemia: currently on no cholesterol med.   Past Medical History  Diagnosis Date  . Hypertension   . Diabetes mellitus   . Hyperlipidemia   . Allergy     allergic rhinitis  . Obesity   . Elevated LFTs      Home Meds: Current Outpatient Prescriptions on File Prior to Visit  Medication Sig Dispense Refill  . aspirin 81 MG tablet Take 81 mg by mouth at bedtime.       . cholecalciferol (VITAMIN D) 1000 UNITS tablet Take 1,000 Units by mouth at bedtime.        . fish oil-omega-3 fatty acids 1000 MG capsule Take 1 g by mouth 2 (two) times daily. 1 in am, 2 in pm      . glipiZIDE (GLUCOTROL) 5 MG tablet Take 10 mg by mouth 2 (two) times daily before a meal.       . lisinopril (PRINIVIL,ZESTRIL) 20 MG tablet Take 20 mg by mouth daily.        . metFORMIN (GLUCOPHAGE) 1000 MG tablet Take 1,000 mg by mouth 2 (two) times daily with a meal.        . Multiple Vitamins-Minerals (MULTIVITAMIN PO) Take 1 tablet by mouth daily.      Marland Kitchen pyridOXINE (VITAMIN B-6) 100 MG tablet Take 100 mg by mouth daily.        . sitaGLIPtin (JANUVIA) 100 MG tablet Take 100 mg by mouth every evening.       .  vitamin C (ASCORBIC ACID) 500 MG tablet Take 500 mg by mouth at bedtime.        Marland Kitchen oxymetazoline (NASAL SPRAY 12 HOUR) 0.05 % nasal spray Place 2 sprays into the nose once. For nasal congestion        No current facility-administered medications on file prior to visit.    Allergies: No Known Allergies  History   Social History  . Marital Status: Married    Spouse Name: N/A    Number of Children: N/A  . Years of Education: N/A   Occupational History  . Not on file.   Social History Main Topics  . Smoking status: Never Smoker   . Smokeless tobacco: Former Neurosurgeon    Types: Chew  . Alcohol Use: No  . Drug Use: No     Comment: Used "cocaine" as young adult  . Sexually Active: Not on file   Other Topics Concern  . Not on file   Social History Narrative  . No narrative on file     Review of Systems: Constitutional: negative for chills, fever, night sweats, weight changes, or fatigue  HEENT: negative for vision changes, hearing loss, congestion, rhinorrhea, or epistaxis Cardiovascular:  negative for chest pain, palpitations, diaphoresis, DOE, orthopnea, or edema Respiratory: negative for hemoptysis, wheezing, shortness of breath, dyspnea, or cough Abdominal: negative for abdominal pain, nausea, vomiting, diarrhea, or constipation Dermatological: negative for rash, erythema, or wounds Neurologic: negative for headache, dizziness, or syncope All other systems reviewed and are otherwise negative with the exception to those above and in the HPI.   Physical Exam: Blood pressure 120/70, pulse 88, temperature 97.2 F (36.2 C), temperature source Oral, resp. rate 18, height 5\' 6"  (1.676 m), weight 250 lb (113.399 kg)., Body mass index is 40.37 kg/(m^2). General:  Obese wm. in no acute distress.  Neck: Supple. No thyromegaly. Full ROM. No lymphadenopathy.No carotid bruits. Lungs: Clear bilaterally to auscultation without wheezes, rales, or rhonchi. Breathing is unlabored. Heart: RRR  with normal S1 S2. No murmurs, rubs, or gallops. Abdomen: Soft, non-tender, non-distended with normoactive bowel sounds. No hepatosplenomegaly. No rebound/guarding. No obvious abdominal masses. Msk:  Strength and tone normal for age. Extremities/Skin: Warm and dry. No clubbing or cyanosis. No edema. No rashes or suspicious lesions.  Neuro: Alert and oriented X 3. Moves all extremities spontaneously. Gait is normal. CNII-XII grossly in tact. Psych:  Responds to questions appropriately with a normal affect. Diabetic Foot Exam: Normal inspection. No skin breakdown. No callous. Normal sensation to light touch and monofilament. Nails are normal. 2+ bilateral dorsalis pedis pulses. 2+ bilateral posterior tibialis pulses.    Assessment/Plan:  1. DIABETES MELLITUS, TYPE II, UNCONTROLLED His A1c now is 7.4. Explained to him that the Actos is the only mediction taht is not at max dose. Explained that he is on every class of medication. Explained that increaseing Actos from 30 to 45 is not going to decrease sugar hta much and will not get A1C to goal. He really needs to add Insulin. He says he will make diet, exercise cahnges. Will increase Actos and cont other current meds.  - pioglitazone (ACTOS) 45 MG tablet; Take 1 tablet (45 mg total) by mouth daily.  Dispense: 30 tablet; Refill: 5  2. Elevated LFTs - COMPLETE METABOLIC PANEL WITH GFR  3. HYPERLIPIDEMIA - Lipid panel - COMPLETE METABOLIC PANEL WITH GFR Will check labs then treat accordingly.  * At lab here 10/02/11 LFTs wre slightly elevated. He was told to hold Pravastatin for i month and recheck. Per pt has been off statin ever since then.  * 10/29/11 he had Hepatitis panel which was negative.  Elevated LFTs probably sec to fatty liver.  Probably can start stain and monitor but will await lab results then address.  4. HYPERTENSION At goal. On ACE Inh. Cont current med . Check lab - COMPLETE METABOLIC PANEL WITH GFR  5. OBESITY NOS He must  make diet exercise changes as above.  F/U OV 3 months -  SignedShon Hale [place], [place], [facility]  09/17/2012 10:01 AM

## 2012-09-19 ENCOUNTER — Telehealth: Payer: Self-pay | Admitting: Family Medicine

## 2012-09-19 DIAGNOSIS — Z79899 Other long term (current) drug therapy: Secondary | ICD-10-CM

## 2012-09-19 DIAGNOSIS — E785 Hyperlipidemia, unspecified: Secondary | ICD-10-CM

## 2012-09-19 MED ORDER — PRAVASTATIN SODIUM 20 MG PO TABS: 20.0000 mg | ORAL_TABLET | Freq: Every day | ORAL | Status: DC

## 2012-09-19 NOTE — Telephone Encounter (Signed)
Message copied by Donne Anon on Mon Sep 19, 2012  4:45 PM ------      Message from: Allayne Butcher      Created: Sat Sep 17, 2012  3:24 PM       Liver numbers are normal. Would try pravastatin agin and will monitor liver closely.       Order RX: Pravastain 20mg  one po QHS # 30/0ne.      Order FLP/LFT -tell pt to check these in 4 weeks-will check them sooner than 6 weeks to make sure LFTs do not go up. ------

## 2012-09-19 NOTE — Telephone Encounter (Signed)
Pt called and made aware of lab results.  To restart Pravastatin as order by provider.  Told to repeat lab work in 4 weeks.

## 2012-10-18 ENCOUNTER — Encounter: Payer: Self-pay | Admitting: Physician Assistant

## 2012-10-26 ENCOUNTER — Other Ambulatory Visit: Payer: Self-pay | Admitting: Family Medicine

## 2012-10-27 ENCOUNTER — Telehealth: Payer: Self-pay | Admitting: Physician Assistant

## 2012-10-27 MED ORDER — GLIPIZIDE 5 MG PO TABS: 10.0000 mg | ORAL_TABLET | Freq: Two times a day (BID) | ORAL | Status: DC

## 2012-10-27 MED ORDER — SITAGLIPTIN PHOSPHATE 100 MG PO TABS: 100.0000 mg | ORAL_TABLET | Freq: Every evening | ORAL | Status: DC

## 2012-10-27 MED ORDER — METFORMIN HCL 1000 MG PO TABS: 1000.0000 mg | ORAL_TABLET | Freq: Two times a day (BID) | ORAL | Status: DC

## 2012-10-27 NOTE — Telephone Encounter (Signed)
Medication refilled per protocol. 

## 2012-10-27 NOTE — Telephone Encounter (Signed)
Med rf °

## 2012-11-21 ENCOUNTER — Telehealth: Payer: Self-pay | Admitting: Family Medicine

## 2012-11-21 MED ORDER — SITAGLIPTIN PHOSPHATE 100 MG PO TABS: 100.0000 mg | ORAL_TABLET | Freq: Every evening | ORAL | Status: DC

## 2012-11-21 NOTE — Telephone Encounter (Signed)
Rx Refilled  

## 2012-12-15 ENCOUNTER — Ambulatory Visit (INDEPENDENT_AMBULATORY_CARE_PROVIDER_SITE_OTHER): Payer: PRIVATE HEALTH INSURANCE | Admitting: Physician Assistant

## 2012-12-15 ENCOUNTER — Encounter: Payer: Self-pay | Admitting: Physician Assistant

## 2012-12-15 VITALS — BP 126/78 | HR 64 | Temp 98.0°F | Resp 18 | Ht 67.0 in | Wt 250.0 lb

## 2012-12-15 DIAGNOSIS — E669 Obesity, unspecified: Secondary | ICD-10-CM

## 2012-12-15 DIAGNOSIS — R7989 Other specified abnormal findings of blood chemistry: Secondary | ICD-10-CM

## 2012-12-15 DIAGNOSIS — E785 Hyperlipidemia, unspecified: Secondary | ICD-10-CM

## 2012-12-15 DIAGNOSIS — I10 Essential (primary) hypertension: Secondary | ICD-10-CM

## 2012-12-15 NOTE — Progress Notes (Signed)
Patient ID: Kyle Ray MRN: [id number], DOB: 1961-03-02, 52 y.o. Date of Encounter: 12/15/12,      Chief Complaint: Diabetes, Hypertension, Hyperlipidemia Follow Up   HPI: 52 y.o. year old male with history below presents for follow up.  Blood sugars at home: He is not checking routinely Diet consists of: He is not compliant with diet.   Exercise: Does not exercise. 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 He refused this.  Increased Actos to 45. All other meds were already at max doses and on every class of med.  He was to improve diet, exercise.  Does no exercise b/c works long hours and exhausted. Only diet changes: increased vegetables and decreased junk food. And decreased diet sodas, increased water.   Hypertension: Taking medications as directed with no adverse effects. No lower extremity swelling.   Hyperlipidemia: At LOV was on no chol med. Lab done then-4/14. Did start Prav as directed. IS taking this. No myalgias or adv effects.     Past Medical History   Diagnosis  Date   .  Hypertension     .  Diabetes mellitus     .  Hyperlipidemia     .  Allergy         allergic rhinitis   .  Obesity     .  Elevated LFTs          Home Meds: Current Outpatient Prescriptions on File Prior to Visit   Medication  Sig  Dispense  Refill   .  aspirin 81 MG tablet  Take 81 mg by mouth at bedtime.          .  cholecalciferol (VITAMIN D) 1000 UNITS tablet  Take 1,000 Units by mouth at bedtime.           .  fish oil-omega-3 fatty acids 1000 MG capsule  Take 1 g by mouth 2 (two) times daily. 1 in am, 2 in pm         .  glipiZIDE (GLUCOTROL) 5 MG tablet  Take 10 mg by mouth 2 (two) times daily before a meal.          .  lisinopril (PRINIVIL,ZESTRIL) 20 MG tablet  Take 20 mg by mouth daily.           .  metFORMIN (GLUCOPHAGE) 1000 MG tablet  Take 1,000 mg  by mouth 2 (two) times daily with a meal.           .  Multiple Vitamins-Minerals (MULTIVITAMIN PO)  Take 1 tablet by mouth daily.         Marland Kitchen  pyridOXINE (VITAMIN B-6) 100 MG tablet  Take 100 mg by mouth daily.           .  sitaGLIPtin (JANUVIA) 100 MG tablet  Take 100 mg by mouth every evening.          .  vitamin C (ASCORBIC ACID) 500 MG tablet  Take 500 mg by mouth at bedtime.           Marland Kitchen  oxymetazoline (NASAL SPRAY 12 HOUR) 0.05 % nasal spray  Place 2 sprays into the nose once. For nasal congestion          Actos 45mg  QD    No current facility-administered medications on file prior to visit.  Allergies: No Known Allergies    History       Social History   .  Marital Status:  Married       Spouse Name:  N/A       Number of Children:  N/A   .  Years of Education:  N/A       Occupational History   .  Not on file.       Social History Main Topics   .  Smoking status:  Never Smoker    .  Smokeless tobacco:  Former Neurosurgeon       Types:  Chew   .  Alcohol Use:  No   .  Drug Use:  No         Comment: Used "cocaine" as young adult   .  Sexually Active:  Not on file       Other Topics  Concern   .  Not on file       Social History Narrative   .  No narrative on file        Review of Systems: Constitutional: negative for chills, fever, night sweats, weight changes, or fatigue   HEENT: negative for vision changes, hearing loss, congestion, rhinorrhea, or epistaxis Cardiovascular: negative for chest pain, palpitations, diaphoresis, DOE, orthopnea, or edema Respiratory: negative for hemoptysis, wheezing, shortness of breath, dyspnea, or cough Abdominal: negative for abdominal pain, nausea, vomiting, diarrhea, or constipation Dermatological: negative for rash, erythema, or wounds Neurologic: negative for headache, dizziness, or syncope All other systems reviewed and are otherwise negative with the exception to those above and in the HPI.      Physical Exam: Blood  pressure 120/70, pulse 88, temperature 97.2 F (36.2 C), temperature source Oral, resp. rate 18, height 5\' 6"  (1.676 m), weight 250 lb (113.399 kg)., Body mass index is 40.37 kg/(m^2). General:  Obese wm. in no acute distress.   Neck: Supple. No thyromegaly. Full ROM. No lymphadenopathy.No carotid bruits. Lungs: Clear bilaterally to auscultation without wheezes, rales, or rhonchi. Breathing is unlabored. Heart: RRR with normal S1 S2. No murmurs, rubs, or gallops. Abdomen: Soft, non-tender, non-distended with normoactive bowel sounds. No hepatosplenomegaly. No rebound/guarding. No obvious abdominal masses. Msk:  Strength and tone normal for age. Extremities/Skin: Warm and dry. No clubbing or cyanosis. No edema. No rashes or suspicious lesions.   Neuro: Alert and oriented X 3. Moves all extremities spontaneously. Gait is normal. CNII-XII grossly in tact. Psych:  Responds to questions appropriately with a normal affect. Diabetic Foot Exam: Normal inspection. No skin breakdown. No callous. Normal sensation to light touch and monofilament. Nails are normal. 2+ bilateral dorsalis pedis pulses. 2+ bilateral posterior tibialis pulses.        Assessment/Plan:   1. DIABETES MELLITUS, TYPE II, UNCONTROLLED Check A1C.  Cont to improve diet.  Cont all current meds.   2. H/O Elevated LFTs--were nml 4/14 so started statin. Monitor LFTs closely.  - COMPLETE METABOLIC PANEL WITH GFR   3. HYPERLIPIDEMIA Startd Prav 20 4/14. Recheck now.  - Lipid panel/LFT  4. HTN: AT goal. Cont current meds.   ROV 3 months  Frazier [place], [place]

## 2012-12-16 ENCOUNTER — Ambulatory Visit: Payer: PRIVATE HEALTH INSURANCE | Admitting: Physician Assistant

## 2012-12-16 LAB — COMPLETE METABOLIC PANEL WITH GFR
ALT: 41 U/L (ref 0–53)
AST: 31 U/L (ref 0–37)
Calcium: 9.7 mg/dL (ref 8.4–10.5)
Chloride: 105 mEq/L (ref 96–112)
Creat: 0.95 mg/dL (ref 0.50–1.35)
Sodium: 139 mEq/L (ref 135–145)
Total Bilirubin: 0.6 mg/dL (ref 0.3–1.2)

## 2012-12-16 LAB — LIPID PANEL
HDL: 45 mg/dL (ref >39)
LDL Cholesterol: 92 mg/dL (ref 0–99)
Triglycerides: 99 mg/dL (ref <150)
VLDL: 20 mg/dL (ref 0–40)

## 2013-01-19 ENCOUNTER — Telehealth: Payer: Self-pay | Admitting: Physician Assistant

## 2013-01-19 MED ORDER — GLIPIZIDE 10 MG PO TABS: 10.0000 mg | ORAL_TABLET | Freq: Two times a day (BID) | ORAL | Status: DC

## 2013-01-19 MED ORDER — METFORMIN HCL 1000 MG PO TABS: 1000.0000 mg | ORAL_TABLET | Freq: Two times a day (BID) | ORAL | Status: DC

## 2013-01-19 NOTE — Telephone Encounter (Signed)
Medication refilled per protocol. 

## 2013-01-19 NOTE — Telephone Encounter (Signed)
Glipizide 10 mg 1 BID #180 Metformin 1000 mg 1 BID #180

## 2013-01-30 ENCOUNTER — Other Ambulatory Visit: Payer: Self-pay | Admitting: Family Medicine

## 2013-01-30 DIAGNOSIS — E119 Type 2 diabetes mellitus without complications: Secondary | ICD-10-CM

## 2013-01-30 MED ORDER — SITAGLIPTIN PHOSPHATE 100 MG PO TABS: 100.0000 mg | ORAL_TABLET | Freq: Every evening | ORAL | Status: DC

## 2013-03-03 ENCOUNTER — Telehealth: Payer: Self-pay | Admitting: Family Medicine

## 2013-03-03 ENCOUNTER — Other Ambulatory Visit: Payer: Self-pay | Admitting: Physician Assistant

## 2013-03-03 NOTE — Telephone Encounter (Signed)
Medication refilled per protocol. 

## 2013-03-20 ENCOUNTER — Ambulatory Visit (INDEPENDENT_AMBULATORY_CARE_PROVIDER_SITE_OTHER): Payer: PRIVATE HEALTH INSURANCE | Admitting: Family Medicine

## 2013-03-20 ENCOUNTER — Encounter: Payer: Self-pay | Admitting: Family Medicine

## 2013-03-20 VITALS — BP 122/76 | HR 68 | Temp 98.0°F | Resp 18 | Wt 249.0 lb

## 2013-03-20 DIAGNOSIS — E785 Hyperlipidemia, unspecified: Secondary | ICD-10-CM

## 2013-03-20 DIAGNOSIS — E119 Type 2 diabetes mellitus without complications: Secondary | ICD-10-CM

## 2013-03-20 DIAGNOSIS — K219 Gastro-esophageal reflux disease without esophagitis: Secondary | ICD-10-CM

## 2013-03-20 DIAGNOSIS — I10 Essential (primary) hypertension: Secondary | ICD-10-CM

## 2013-03-20 LAB — COMPLETE METABOLIC PANEL WITH GFR
Alkaline Phosphatase: 64 U/L (ref 39–117)
BUN: 12 mg/dL (ref 6–23)
GFR, Est Non African American: >89 mL/min
Glucose, Bld: 140 mg/dL — ABNORMAL HIGH (ref 70–99)
Total Bilirubin: 0.7 mg/dL (ref 0.3–1.2)

## 2013-03-20 LAB — LIPID PANEL
Cholesterol: 163 mg/dL (ref 0–200)
HDL: 42 mg/dL (ref >39)
Total CHOL/HDL Ratio: 3.9 Ratio

## 2013-03-20 LAB — MICROALBUMIN, URINE: Microalb, Ur: 0.99 mg/dL (ref 0.00–1.89)

## 2013-03-20 MED ORDER — OMEPRAZOLE 40 MG PO CPDR: 40.0000 mg | DELAYED_RELEASE_CAPSULE | Freq: Every day | ORAL | Status: DC

## 2013-03-20 NOTE — Progress Notes (Signed)
Subjective:    Patient ID: Kyle Ray, male    DOB: September 21, 1960, 52 y.o.   MRN: [id number]  HPI Patient is here for followup of his diabetes mellitus type 2, hypertension, and hyperlipidemia. With regards to his diabetes, he is currently on glipizide, metformin, Actos, and Januvia. He reports fasting blood sugars around 120. He denies hypoglycemic episodes. He denies polyuria, polydipsia, blurred vision, or neuropathy in the feet. He gets an annual eye exam. He is currently taking aspirin 81 mg by mouth daily. He is also taking lisinopril hypertension. He denies any chest pain, shortness of breath, dyspnea on exertion. He is also taking pravastatin for hyperlipidemia. He denies any myalgias or right quadrant pain. He is due for fasting lab work. He does report increased indigestion recently. It is worse with he bends over or after meals. Past Medical History  Diagnosis Date  . Hypertension   . Diabetes mellitus   . Hyperlipidemia   . Allergy     allergic rhinitis  . Obesity   . Elevated LFTs    No past surgical history on file. Current Outpatient Prescriptions on File Prior to Visit  Medication Sig Dispense Refill  . aspirin 81 MG tablet Take 81 mg by mouth at bedtime.       . cholecalciferol (VITAMIN D) 1000 UNITS tablet Take 1,000 Units by mouth at bedtime.        . fish oil-omega-3 fatty acids 1000 MG capsule Take 1 g by mouth 2 (two) times daily. 1 in am, 2 in pm      . glipiZIDE (GLUCOTROL) 10 MG tablet Take 1 tablet (10 mg total) by mouth 2 (two) times daily before a meal.  180 tablet  1  . lisinopril (PRINIVIL,ZESTRIL) 20 MG tablet Take 20 mg by mouth daily.        . metFORMIN (GLUCOPHAGE) 1000 MG tablet Take 1 tablet (1,000 mg total) by mouth 2 (two) times daily with a meal.  180 tablet  1  . Multiple Vitamins-Minerals (MULTIVITAMIN PO) Take 1 tablet by mouth daily.      . pioglitazone (ACTOS) 45 MG tablet Take 1 tablet (45 mg total) by mouth daily.  30 tablet  5  . pravastatin  (PRAVACHOL) 20 MG tablet TAKE ONE TABLET BY MOUTH DAILY.  30 tablet  4  . pyridOXINE (VITAMIN B-6) 100 MG tablet Take 100 mg by mouth daily.        . sitaGLIPtin (JANUVIA) 100 MG tablet Take 1 tablet (100 mg total) by mouth every evening.  90 tablet  3  . vitamin C (ASCORBIC ACID) 500 MG tablet Take 500 mg by mouth at bedtime.         No current facility-administered medications on file prior to visit.   No Known Allergies History   Social History  . Marital Status: Married    Spouse Name: N/A    Number of Children: N/A  . Years of Education: N/A   Occupational History  . Not on file.   Social History Main Topics  . Smoking status: Never Smoker   . Smokeless tobacco: Former Neurosurgeon    Types: Chew  . Alcohol Use: No  . Drug Use: No     Comment: Used "cocaine" as young adult  . Sexual Activity: Not on file   Other Topics Concern  . Not on file   Social History Narrative  . No narrative on file   Family History  Problem Relation Age of Onset  .  Hyperlipidemia Father   . Hypertension Father   . Heart disease Father   . Alcohol abuse Father      Review of Systems  All other systems reviewed and are negative.       Objective:   Physical Exam  Vitals reviewed. Constitutional: He is oriented to person, place, and time.  Neck: Neck supple. No JVD present. No thyromegaly present.  Cardiovascular: Normal rate, regular rhythm, normal heart sounds and intact distal pulses.  Exam reveals no gallop and no friction rub.   No murmur heard. Pulmonary/Chest: Effort normal and breath sounds normal. No respiratory distress. He has no wheezes. He has no rales. He exhibits no tenderness.  Abdominal: Soft. Bowel sounds are normal. He exhibits no distension and no mass. There is no tenderness. There is no rebound and no guarding.  Musculoskeletal: He exhibits no edema.  Lymphadenopathy:    He has no cervical adenopathy.  Neurological: He is alert and oriented to person, place, and  time. He has normal reflexes. He displays normal reflexes. No cranial nerve deficit. He exhibits normal muscle tone. Coordination normal.  Skin: Skin is warm. No rash noted. No erythema. No pallor.          Assessment & Plan:  1. GERD (gastroesophageal reflux disease) Try Prilosec 40 mg by mouth daily recheck in 2 weeks if no better - omeprazole (PRILOSEC) 40 MG capsule; Take 1 capsule (40 mg total) by mouth daily.  Dispense: 30 capsule; Refill: 3  2. Type II or unspecified type diabetes mellitus without mention of complication, not stated as uncontrolled Hemoglobin A1c. Goal hemoglobin A1c is less than 6.5. Patient is taking an aspirin. His diabetic exam is normal. He gets regular eye exams. I will check a urine microalbumin.  I also recommended a flu shot which the patient deferred for now. - COMPLETE METABOLIC PANEL WITH GFR - Lipid panel - Hemoglobin A1c - Microalbumin, urine  3. HTN (hypertension) Blood pressure is well controlled. I will increase his ACE inhibitor if microalbumin is elevated.  4. HLD (hyperlipidemia) Goal LDL is less than 100. Check fasting lipid panel

## 2013-04-24 ENCOUNTER — Telehealth: Payer: Self-pay | Admitting: Physician Assistant

## 2013-04-24 MED ORDER — METFORMIN HCL 1000 MG PO TABS: 1000.0000 mg | ORAL_TABLET | Freq: Two times a day (BID) | ORAL | Status: DC

## 2013-04-24 MED ORDER — GLIPIZIDE 10 MG PO TABS: 10.0000 mg | ORAL_TABLET | Freq: Two times a day (BID) | ORAL | Status: DC

## 2013-04-24 NOTE — Telephone Encounter (Signed)
Pt is calling today because he is needing refills on his glipizide and Metformin Pharmacy is [facility] back number is [phone] can leave message on voicemail

## 2013-04-24 NOTE — Telephone Encounter (Signed)
Medication refilled per protocol. 

## 2013-04-28 ENCOUNTER — Telehealth: Payer: Self-pay | Admitting: Physician Assistant

## 2013-04-28 ENCOUNTER — Encounter: Payer: Self-pay | Admitting: Family Medicine

## 2013-04-28 DIAGNOSIS — E785 Hyperlipidemia, unspecified: Secondary | ICD-10-CM

## 2013-04-28 DIAGNOSIS — I10 Essential (primary) hypertension: Secondary | ICD-10-CM

## 2013-04-28 MED ORDER — PRAVASTATIN SODIUM 20 MG PO TABS: 20.0000 mg | ORAL_TABLET | Freq: Every day | ORAL | Status: DC

## 2013-04-28 MED ORDER — LISINOPRIL 20 MG PO TABS: 20.0000 mg | ORAL_TABLET | Freq: Every day | ORAL | Status: DC

## 2013-04-28 MED ORDER — PIOGLITAZONE HCL 45 MG PO TABS: 45.0000 mg | ORAL_TABLET | Freq: Every day | ORAL | Status: DC

## 2013-04-28 NOTE — Telephone Encounter (Signed)
Medication refill for one time only.  Patient needs to be seen.  Letter sent for patient to call and schedule 

## 2013-04-28 NOTE — Telephone Encounter (Signed)
Have to do 90 days instead of 30 days Medications that needed to be refilled actose  Lisinopril  cholestoral medication  Pharmacy [facility] in [place] (near the old [address]) Call back number is [phone]

## 2013-06-22 ENCOUNTER — Encounter: Payer: Self-pay | Admitting: Family Medicine

## 2013-06-22 ENCOUNTER — Ambulatory Visit (INDEPENDENT_AMBULATORY_CARE_PROVIDER_SITE_OTHER): Payer: PRIVATE HEALTH INSURANCE | Admitting: Family Medicine

## 2013-06-22 VITALS — BP 120/60 | HR 68 | Temp 97.2°F | Resp 16 | Ht 67.0 in | Wt 226.0 lb

## 2013-06-22 DIAGNOSIS — E119 Type 2 diabetes mellitus without complications: Secondary | ICD-10-CM

## 2013-06-22 LAB — HEMOGLOBIN A1C
Hgb A1c MFr Bld: 6.2 % — ABNORMAL HIGH (ref <5.7)
Mean Plasma Glucose: 131 mg/dL — ABNORMAL HIGH (ref <117)

## 2013-06-22 LAB — COMPLETE METABOLIC PANEL WITH GFR
ALBUMIN: 4.6 g/dL (ref 3.5–5.2)
ALT: 27 U/L (ref 0–53)
AST: 24 U/L (ref 0–37)
Alkaline Phosphatase: 59 U/L (ref 39–117)
BILIRUBIN TOTAL: 0.6 mg/dL (ref 0.2–1.2)
BUN: 14 mg/dL (ref 6–23)
CALCIUM: 9.7 mg/dL (ref 8.4–10.5)
CO2: 29 meq/L (ref 19–32)
Chloride: 102 mEq/L (ref 96–112)
Creat: 0.91 mg/dL (ref 0.50–1.35)
GFR, Est African American: >89 mL/min
Glucose, Bld: 77 mg/dL (ref 70–99)
POTASSIUM: 4.5 meq/L (ref 3.5–5.3)
Sodium: 139 mEq/L (ref 135–145)
TOTAL PROTEIN: 7.4 g/dL (ref 6.0–8.3)

## 2013-06-22 LAB — LIPID PANEL
Cholesterol: 139 mg/dL (ref 0–200)
HDL: 42 mg/dL (ref >39)
LDL Cholesterol: 79 mg/dL (ref 0–99)
Total CHOL/HDL Ratio: 3.3 Ratio
Triglycerides: 90 mg/dL (ref <150)
VLDL: 18 mg/dL (ref 0–40)

## 2013-06-22 NOTE — Progress Notes (Signed)
Subjective:    Patient ID: Kyle Ray, male    DOB: Jun 29, 1960, 53 y.o.   MRN: [id number]  HPI Patient is here today for followup of his type 2 diabetes mellitus. Was last here in October the patient weighed 249 pounds. His hemoglobin A1c had crept up to 7.0. At that time we had an extensive discussion about diet exercise and weight loss as a means to help control his blood sugars. Since that time the patient has lost 23 pounds. He'll stop snacking altogether. He is trying to exercise more by walking to work. He is watching what he is eating. I am extremely proud of patient's weight loss is achieved a smaller. His blood pressure today is excellent 120/60. He denies any neuropathy in the feet. He denies any chest pain shortness of breath or dyspnea on exertion. He is here today to recheck his fasting lab work. Past Medical History  Diagnosis Date  . Hypertension   . Diabetes mellitus   . Hyperlipidemia   . Allergy     allergic rhinitis  . Obesity   . Elevated LFTs    Current Outpatient Prescriptions on File Prior to Visit  Medication Sig Dispense Refill  . aspirin 81 MG tablet Take 81 mg by mouth at bedtime.       . cholecalciferol (VITAMIN D) 1000 UNITS tablet Take 1,000 Units by mouth at bedtime.        . fish oil-omega-3 fatty acids 1000 MG capsule Take 1 g by mouth 2 (two) times daily. 1 in am, 2 in pm      . glipiZIDE (GLUCOTROL) 10 MG tablet Take 1 tablet (10 mg total) by mouth 2 (two) times daily before a meal.  180 tablet  0  . lisinopril (PRINIVIL,ZESTRIL) 20 MG tablet Take 1 tablet (20 mg total) by mouth daily.  90 tablet  0  . metFORMIN (GLUCOPHAGE) 1000 MG tablet Take 1 tablet (1,000 mg total) by mouth 2 (two) times daily with a meal.  180 tablet  0  . Multiple Vitamins-Minerals (MULTIVITAMIN PO) Take 1 tablet by mouth daily.      Marland Kitchen omeprazole (PRILOSEC) 40 MG capsule Take 1 capsule (40 mg total) by mouth daily.  30 capsule  3  . pioglitazone (ACTOS) 45 MG tablet Take 1  tablet (45 mg total) by mouth daily.  90 tablet  0  . pravastatin (PRAVACHOL) 20 MG tablet Take 1 tablet (20 mg total) by mouth at bedtime.  90 tablet  0  . pyridOXINE (VITAMIN B-6) 100 MG tablet Take 100 mg by mouth daily.        . sitaGLIPtin (JANUVIA) 100 MG tablet Take 1 tablet (100 mg total) by mouth every evening.  90 tablet  3  . vitamin C (ASCORBIC ACID) 500 MG tablet Take 500 mg by mouth at bedtime.         No current facility-administered medications on file prior to visit.   No Known Allergies History   Social History  . Marital Status: Married    Spouse Name: N/A    Number of Children: N/A  . Years of Education: N/A   Occupational History  . Not on file.   Social History Main Topics  . Smoking status: Never Smoker   . Smokeless tobacco: Former Neurosurgeon    Types: Chew  . Alcohol Use: No  . Drug Use: No     Comment: Used "cocaine" as young adult  . Sexual Activity: Not on file  Other Topics Concern  . Not on file   Social History Narrative  . No narrative on file      Review of Systems  All other systems reviewed and are negative.       Objective:   Physical Exam  Vitals reviewed. Constitutional: He is oriented to person, place, and time. He appears well-developed and well-nourished.  Eyes: Conjunctivae are normal. No scleral icterus.  Neck: Neck supple. No thyromegaly present.  Cardiovascular: Normal rate, regular rhythm and normal heart sounds.  Exam reveals no gallop and no friction rub.   No murmur heard. Pulmonary/Chest: Effort normal and breath sounds normal. No respiratory distress. He has no wheezes. He has no rales. He exhibits no tenderness.  Abdominal: Soft. Bowel sounds are normal. He exhibits no distension. There is no tenderness. There is no rebound and no guarding.  Musculoskeletal: He exhibits no edema.  Neurological: He is alert and oriented to person, place, and time. He has normal reflexes. No cranial nerve deficit. He exhibits normal  muscle tone. Coordination normal.  Skin: No rash noted.          Assessment & Plan:  1. Type II or unspecified type diabetes mellitus without mention of complication, not stated as uncontrolled Recheck hemoglobin A1c today. Patient's goal hemoglobin A1c less than 6.5. If his A1c is much less than 6.5 I will try to start discontinuing medications. Glipizide or Actos would be my first selection considering that they cause weight gain. Await results of the fasting lab work.  His blood pressures well controlled. The patient is taking an aspirin 81 mg by mouth daily. He also gets regular eye exams. - COMPLETE METABOLIC PANEL WITH GFR - Lipid panel - Hemoglobin A1c

## 2013-06-23 ENCOUNTER — Other Ambulatory Visit: Payer: Self-pay | Admitting: Family Medicine

## 2013-06-23 DIAGNOSIS — E1165 Type 2 diabetes mellitus with hyperglycemia: Secondary | ICD-10-CM

## 2013-06-23 DIAGNOSIS — IMO0001 Reserved for inherently not codable concepts without codable children: Secondary | ICD-10-CM

## 2013-06-23 DIAGNOSIS — Z79899 Other long term (current) drug therapy: Secondary | ICD-10-CM

## 2013-06-23 DIAGNOSIS — E785 Hyperlipidemia, unspecified: Secondary | ICD-10-CM

## 2013-06-23 DIAGNOSIS — I10 Essential (primary) hypertension: Secondary | ICD-10-CM

## 2013-06-23 DIAGNOSIS — E119 Type 2 diabetes mellitus without complications: Secondary | ICD-10-CM

## 2013-07-25 ENCOUNTER — Encounter: Payer: Self-pay | Admitting: Family Medicine

## 2013-07-25 ENCOUNTER — Other Ambulatory Visit: Payer: Self-pay | Admitting: Family Medicine

## 2013-07-25 NOTE — Telephone Encounter (Signed)
Medication refilled per protocol. 

## 2013-08-19 ENCOUNTER — Other Ambulatory Visit: Payer: Self-pay | Admitting: Family Medicine

## 2013-09-21 ENCOUNTER — Encounter: Payer: Self-pay | Admitting: Family Medicine

## 2013-09-21 ENCOUNTER — Ambulatory Visit (INDEPENDENT_AMBULATORY_CARE_PROVIDER_SITE_OTHER): Payer: PRIVATE HEALTH INSURANCE | Admitting: Family Medicine

## 2013-09-21 VITALS — BP 100/60 | HR 72 | Temp 97.1°F | Resp 16 | Ht 67.0 in | Wt 209.0 lb

## 2013-09-21 DIAGNOSIS — IMO0001 Reserved for inherently not codable concepts without codable children: Secondary | ICD-10-CM

## 2013-09-21 DIAGNOSIS — Z23 Encounter for immunization: Secondary | ICD-10-CM | POA: Diagnosis not present

## 2013-09-21 DIAGNOSIS — E1165 Type 2 diabetes mellitus with hyperglycemia: Principal | ICD-10-CM

## 2013-09-21 DIAGNOSIS — I10 Essential (primary) hypertension: Secondary | ICD-10-CM | POA: Diagnosis not present

## 2013-09-21 DIAGNOSIS — E785 Hyperlipidemia, unspecified: Secondary | ICD-10-CM | POA: Diagnosis not present

## 2013-09-21 LAB — COMPLETE METABOLIC PANEL WITH GFR
ALBUMIN: 4.8 g/dL (ref 3.5–5.2)
ALT: 22 U/L (ref 0–53)
AST: 22 U/L (ref 0–37)
Alkaline Phosphatase: 60 U/L (ref 39–117)
BUN: 13 mg/dL (ref 6–23)
CO2: 27 mEq/L (ref 19–32)
Calcium: 9.6 mg/dL (ref 8.4–10.5)
Chloride: 103 mEq/L (ref 96–112)
Creat: 0.92 mg/dL (ref 0.50–1.35)
GFR, Est African American: >89 mL/min
GLUCOSE: 81 mg/dL (ref 70–99)
POTASSIUM: 4.6 meq/L (ref 3.5–5.3)
Sodium: 140 mEq/L (ref 135–145)
Total Bilirubin: 0.7 mg/dL (ref 0.2–1.2)
Total Protein: 7.2 g/dL (ref 6.0–8.3)

## 2013-09-21 LAB — LIPID PANEL
Cholesterol: 127 mg/dL (ref 0–200)
HDL: 46 mg/dL (ref >39)
LDL Cholesterol: 69 mg/dL (ref 0–99)
Total CHOL/HDL Ratio: 2.8 Ratio
Triglycerides: 61 mg/dL (ref <150)
VLDL: 12 mg/dL (ref 0–40)

## 2013-09-21 LAB — HEMOGLOBIN A1C
Hgb A1c MFr Bld: 5.6 % (ref <5.7)
MEAN PLASMA GLUCOSE: 114 mg/dL (ref <117)

## 2013-09-21 LAB — MICROALBUMIN, URINE: MICROALB UR: 0.5 mg/dL (ref 0.00–1.89)

## 2013-09-21 NOTE — Progress Notes (Signed)
Subjective:    Patient ID: Kyle Ray, male    DOB: [birth year], 53 y.o.   MRN: [id number]  HPI  Patient is here today for followup of his diabetes. He continues to lose weight. He is down to 209 pounds. This is a 45 pound weight loss total. He is not checking his sugars but he feels like he may be hypoglycemic at times. He denies any chest pain shortness of breath or dyspnea on exertion. He denies any hypoglycemia. He denies any polyuria, polydipsia, or blurred vision. He is due for his eye exam schedule this on his mind based on his days off. He denies any myalgias or right upper quadrant pain. His diabetic foot exam is performed today and is up-to-date and normal. He is taking an aspirin 81 mg by mouth daily. Past Medical History  Diagnosis Date  . Hypertension   . Diabetes mellitus   . Hyperlipidemia   . Allergy     allergic rhinitis  . Obesity   . Elevated LFTs    No past surgical history on file. Current Outpatient Prescriptions on File Prior to Visit  Medication Sig Dispense Refill  . aspirin 81 MG tablet Take 81 mg by mouth at bedtime.       . cholecalciferol (VITAMIN D) 1000 UNITS tablet Take 1,000 Units by mouth at bedtime.        . fish oil-omega-3 fatty acids 1000 MG capsule Take 1 g by mouth 2 (two) times daily. 1 in am, 2 in pm      . glipiZIDE (GLUCOTROL) 10 MG tablet Take 1 tablet (10 mg total) by mouth 2 (two) times daily before a meal.  180 tablet  0  . lisinopril (PRINIVIL,ZESTRIL) 20 MG tablet TAKE 1 TABLET (20 MG TOTAL) BY MOUTH DAILY.  90 tablet  1  . metFORMIN (GLUCOPHAGE) 1000 MG tablet Take 1 tablet (1,000 mg total) by mouth 2 (two) times daily with a meal.  180 tablet  0  . Multiple Vitamins-Minerals (MULTIVITAMIN PO) Take 1 tablet by mouth daily.      Marland Kitchen. omeprazole (PRILOSEC) 40 MG capsule Take 1 capsule (40 mg total) by mouth daily.  30 capsule  3  . pravastatin (PRAVACHOL) 20 MG tablet TAKE 1 TABLET (20 MG TOTAL) BY MOUTH AT BEDTIME.  90 tablet  1  .  pyridOXINE (VITAMIN B-6) 100 MG tablet Take 100 mg by mouth daily.        . vitamin C (ASCORBIC ACID) 500 MG tablet Take 500 mg by mouth at bedtime.         No current facility-administered medications on file prior to visit.   No Known Allergies History   Social History  . Marital Status: Married    Spouse Name: N/A    Number of Children: N/A  . Years of Education: N/A   Occupational History  . Not on file.   Social History Main Topics  . Smoking status: Never Smoker   . Smokeless tobacco: Former NeurosurgeonUser    Types: Chew  . Alcohol Use: No  . Drug Use: No     Comment: Used "cocaine" as young adult  . Sexual Activity: Not on file   Other Topics Concern  . Not on file   Social History Narrative  . No narrative on file     Review of Systems  All other systems reviewed and are negative.      Objective:   Physical Exam  Vitals reviewed. Constitutional: He appears  well-developed and well-nourished. No distress.  HENT:  Mouth/Throat: Oropharynx is clear and moist.  Eyes: Conjunctivae are normal.  Neck: Neck supple. No JVD present. No thyromegaly present.  Cardiovascular: Normal rate, regular rhythm, normal heart sounds and intact distal pulses.  Exam reveals no gallop and no friction rub.   No murmur heard. Pulmonary/Chest: Effort normal and breath sounds normal. No respiratory distress. He has no wheezes. He has no rales. He exhibits no tenderness.  Abdominal: Soft. Bowel sounds are normal. He exhibits no distension. There is no tenderness. There is no rebound and no guarding.  Musculoskeletal: He exhibits no edema and no tenderness.  Lymphadenopathy:    He has no cervical adenopathy.  Skin: He is not diaphoretic.          Assessment & Plan:  1. Type II or unspecified type diabetes mellitus without mention of complication, uncontrolled Check hemoglobin A1c today. Hemoglobin A1c is less than 6.5 I would discontinue the patient's Actos. Graduated him on his weight  loss and encouraged continued efforts he is making controlling his diet. I also encouraged him to schedule his appointment with his ophthalmologist - COMPLETE METABOLIC PANEL WITH GFR - Lipid panel - Hemoglobin A1c - Microalbumin, urine  2. HTN (hypertension) Blood pressures well controlled continue lisinopril  3. HLD (hyperlipidemia) Check fasting lipid panel. Goal LDL is less than 100.

## 2013-09-21 NOTE — Addendum Note (Signed)
Addended by: Legrand RamsWILLIS, SANDY B on: 09/21/2013 09:58 AM   Modules accepted: Orders

## 2013-10-17 ENCOUNTER — Other Ambulatory Visit: Payer: Self-pay | Admitting: Physician Assistant

## 2013-10-18 NOTE — Telephone Encounter (Signed)
Refill appropriate and filled per protocol. 

## 2013-11-15 ENCOUNTER — Ambulatory Visit (INDEPENDENT_AMBULATORY_CARE_PROVIDER_SITE_OTHER): Payer: PRIVATE HEALTH INSURANCE | Admitting: Family Medicine

## 2013-11-15 ENCOUNTER — Encounter: Payer: Self-pay | Admitting: Family Medicine

## 2013-11-15 VITALS — BP 122/76 | HR 84 | Temp 98.0°F | Resp 16 | Ht 67.0 in | Wt 205.0 lb

## 2013-11-15 DIAGNOSIS — M25511 Pain in right shoulder: Secondary | ICD-10-CM

## 2013-11-15 DIAGNOSIS — M25519 Pain in unspecified shoulder: Secondary | ICD-10-CM

## 2013-11-15 MED ORDER — NAPROXEN 500 MG PO TABS: 500.0000 mg | ORAL_TABLET | Freq: Two times a day (BID) | ORAL | Status: DC

## 2013-11-15 NOTE — Progress Notes (Signed)
Patient ID: Kyle DoryMichael H Schooley, male   DOB: 1961-05-22, 53 y.o.   MRN: [id number]   Subjective:    Patient ID: Kyle DoryMichael H Kaigler, male    DOB: 1961-05-22, 53 y.o.   MRN: [id number]  Patient presents for Knot on shoulder blade  patient here with a knot on his right shoulder blade. He did not notice it until today. He's not had any pain or tenderness around it. 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 Denies any neck pain.    Review Of Systems:  GEN- denies fatigue, fever, weight loss,weakness, recent illness HEENT- denies eye drainage, change in vision, nasal discharge, CVS- denies chest pain, palpitations RESP- denies SOB, cough, wheeze MSK- denies joint pain, muscle aches, injury Neuro- denies headache, dizziness, syncope, seizure activity       Objective:    BP 122/76  Pulse 84  Temp(Src) 98 F (36.7 C) (Oral)  Resp 16  Ht 5\' 7"  (1.702 m)  Wt 205 lb (92.987 kg)  BMI 32.10 kg/m2 GEN- NAD, alert and oriented x3 Neck- supple, FROM MSK- Right shoulder blade- swelling noted over spine of scapula, TTP, no fluctuance, slight winging with ROM, rotator cuff in tact, biceps/triceps in tact Pulses- Radial 2+        Assessment & Plan:      Problem List Items Addressed This Visit   None      Note: This dictation was prepared with Dragon dictation along with smaller phrase technology. Any transcriptional errors that result from this process are unintentional.

## 2013-11-15 NOTE — Patient Instructions (Signed)
Start the anti-inflammatory  No heavy lifting GO get xray of shoulder if not better on Friday  ICE the shoulder

## 2013-11-15 NOTE — Assessment & Plan Note (Signed)
The scapular abnormality noted is concerning for either a strain on the muscle tear causing some inflammation and swelling versus possible winking in process. Even with isolation I do not see the complete winged scapula but she would get with a Serratus anterior injury Will start NSAIDS, Use ICE, plan for xray if swelling does not improve

## 2013-11-16 ENCOUNTER — Telehealth: Payer: Self-pay | Admitting: *Deleted

## 2013-11-16 ENCOUNTER — Other Ambulatory Visit: Payer: Self-pay | Admitting: *Deleted

## 2013-11-16 DIAGNOSIS — M25511 Pain in right shoulder: Secondary | ICD-10-CM

## 2013-11-16 NOTE — Telephone Encounter (Signed)
Received call from patient.   Reports that his shoulder started to cause him pain last night after MD evaluation.   Requested to have orders for x-ray placed.   Orders placed and patient will have scans completed on 11/17/2013.

## 2013-11-17 ENCOUNTER — Ambulatory Visit (HOSPITAL_COMMUNITY)
Admission: RE | Admit: 2013-11-17 | Discharge: 2013-11-17 | Disposition: A | Discharge status: Home / Self Care | Payer: PRIVATE HEALTH INSURANCE | Source: Ambulatory Visit | Attending: Family Medicine | Admitting: Family Medicine

## 2013-11-17 DIAGNOSIS — M25511 Pain in right shoulder: Secondary | ICD-10-CM

## 2013-11-17 DIAGNOSIS — M25519 Pain in unspecified shoulder: Secondary | ICD-10-CM | POA: Insufficient documentation

## 2013-12-29 ENCOUNTER — Encounter: Payer: Self-pay | Admitting: Family Medicine

## 2013-12-29 ENCOUNTER — Ambulatory Visit (INDEPENDENT_AMBULATORY_CARE_PROVIDER_SITE_OTHER): Payer: Managed Care, Other (non HMO) | Admitting: Family Medicine

## 2013-12-29 VITALS — BP 100/70 | HR 60 | Temp 97.6°F | Resp 18 | Wt 199.0 lb

## 2013-12-29 DIAGNOSIS — E119 Type 2 diabetes mellitus without complications: Secondary | ICD-10-CM

## 2013-12-29 LAB — HEMOGLOBIN A1C
HEMOGLOBIN A1C: 5.5 % (ref <5.7)
Mean Plasma Glucose: 111 mg/dL (ref <117)

## 2013-12-29 MED ORDER — LISINOPRIL 10 MG PO TABS: 10.0000 mg | ORAL_TABLET | Freq: Every day | ORAL | Status: DC

## 2013-12-29 NOTE — Progress Notes (Signed)
Subjective:    Patient ID: Kyle Ray, male    DOB: May 05, 1961, 53 y.o.   MRN: [id number]  HPI Patient continues to impress me.  He is also an additional 10 pounds since his last office visit even after we discontinued Actos because his hemoglobin A1c was low at 5.6. Additional weight loss he is experiencing hypoglycemic episodes in the morning. He frequently has to stop and eat food at work because he is starting to feel lightheaded dizzy and weak. His blood pressures also borderline low today at 100/70. Patient has been very committed to therapeutic lifestyle changes to help address his diabetes and it shows. He is exercising. He is watching his diet.  He denies any medical problems. Past Medical History  Diagnosis Date  . Hypertension   . Diabetes mellitus   . Hyperlipidemia   . Allergy     allergic rhinitis  . Obesity   . Elevated LFTs    No past surgical history on file. Current Outpatient Prescriptions on File Prior to Visit  Medication Sig Dispense Refill  . aspirin 81 MG tablet Take 81 mg by mouth at bedtime.       . cholecalciferol (VITAMIN D) 1000 UNITS tablet Take 1,000 Units by mouth at bedtime.        Marland Kitchen Cod Liver Oil 1000 MG CAPS Take 1 capsule by mouth daily.      . fish oil-omega-3 fatty acids 1000 MG capsule Take 1 g by mouth 2 (two) times daily. 1 in am, 2 in pm      . glipiZIDE (GLUCOTROL) 10 MG tablet TAKE ONE TABLET BY MOUTH TWICE DAILY.  180 tablet  3  . lisinopril (PRINIVIL,ZESTRIL) 20 MG tablet TAKE 1 TABLET (20 MG TOTAL) BY MOUTH DAILY.  90 tablet  1  . metFORMIN (GLUCOPHAGE) 1000 MG tablet TAKE (1) TABLET BY MOUTH TWICE DAILY.  180 tablet  3  . Multiple Vitamins-Minerals (MULTIVITAMIN PO) Take 1 tablet by mouth daily.      . naproxen (NAPROSYN) 500 MG tablet Take 1 tablet (500 mg total) by mouth 2 (two) times daily with a meal.  60 tablet  2  . pravastatin (PRAVACHOL) 20 MG tablet TAKE 1 TABLET (20 MG TOTAL) BY MOUTH AT BEDTIME.  90 tablet  1  . pyridOXINE  (VITAMIN B-6) 100 MG tablet Take 100 mg by mouth daily.        . sitaGLIPtin (JANUVIA) 100 MG tablet Take 100 mg by mouth daily.      . vitamin C (ASCORBIC ACID) 500 MG tablet Take 500 mg by mouth at bedtime.         No current facility-administered medications on file prior to visit.   No Known Allergies History   Social History  . Marital Status: Married    Spouse Name: N/A    Number of Children: N/A  . Years of Education: N/A   Occupational History  . Not on file.   Social History Main Topics  . Smoking status: Never Smoker   . Smokeless tobacco: Former Neurosurgeon    Types: Chew  . Alcohol Use: No  . Drug Use: No     Comment: Used "cocaine" as young adult  . Sexual Activity: Not on file   Other Topics Concern  . Not on file   Social History Narrative  . No narrative on file      Review of Systems  All other systems reviewed and are negative.  Objective:   Physical Exam  Vitals reviewed. Constitutional: He appears well-developed and well-nourished. No distress.  HENT:  Head: Normocephalic and atraumatic.  Right Ear: External ear normal.  Left Ear: External ear normal.  Nose: Nose normal.  Mouth/Throat: Oropharynx is clear and moist. No oropharyngeal exudate.  Cardiovascular: Normal rate, regular rhythm, normal heart sounds and intact distal pulses.  Exam reveals no gallop and no friction rub.   No murmur heard. Pulmonary/Chest: Effort normal and breath sounds normal. No respiratory distress. He has no wheezes. He has no rales. He exhibits no tenderness.  Abdominal: Soft. Bowel sounds are normal. He exhibits no distension. There is no tenderness. There is no rebound and no guarding.  Musculoskeletal: He exhibits no edema.  Skin: He is not diaphoretic.          Assessment & Plan:  1. Type II or unspecified type diabetes mellitus without mention of complication, not stated as uncontrolled Decrease lisinopril to 10 mg by mouth daily due to hypotension.  Discontinue glipizide due to hypoglycemic episodes. I congratulated the patient on his continued weight loss. I am very proud of the changes he has made in his life to lose weight and address his diet.   - COMPLETE METABOLIC PANEL WITH GFR - Lipid panel - Hemoglobin A1c - Microalbumin, urine

## 2013-12-30 LAB — COMPLETE METABOLIC PANEL WITH GFR
ALK PHOS: 67 U/L (ref 39–117)
ALT: 23 U/L (ref 0–53)
AST: 24 U/L (ref 0–37)
Albumin: 4.9 g/dL (ref 3.5–5.2)
BUN: 12 mg/dL (ref 6–23)
CO2: 26 mEq/L (ref 19–32)
Calcium: 9.7 mg/dL (ref 8.4–10.5)
Chloride: 100 mEq/L (ref 96–112)
Creat: 0.93 mg/dL (ref 0.50–1.35)
GFR, Est African American: >89 mL/min
GLUCOSE: 79 mg/dL (ref 70–99)
Potassium: 4.4 mEq/L (ref 3.5–5.3)
SODIUM: 138 meq/L (ref 135–145)
TOTAL PROTEIN: 7.3 g/dL (ref 6.0–8.3)
Total Bilirubin: 1 mg/dL (ref 0.2–1.2)

## 2013-12-30 LAB — MICROALBUMIN, URINE: Microalb, Ur: 0.81 mg/dL (ref 0.00–1.89)

## 2013-12-30 LAB — LIPID PANEL
Cholesterol: 138 mg/dL (ref 0–200)
HDL: 47 mg/dL (ref >39)
LDL Cholesterol: 79 mg/dL (ref 0–99)
Total CHOL/HDL Ratio: 2.9 Ratio
Triglycerides: 62 mg/dL (ref <150)
VLDL: 12 mg/dL (ref 0–40)

## 2014-02-18 ENCOUNTER — Other Ambulatory Visit: Payer: Self-pay | Admitting: Family Medicine

## 2014-02-24 ENCOUNTER — Other Ambulatory Visit: Payer: Self-pay | Admitting: Family Medicine

## 2014-03-22 ENCOUNTER — Ambulatory Visit (INDEPENDENT_AMBULATORY_CARE_PROVIDER_SITE_OTHER): Payer: Managed Care, Other (non HMO) | Admitting: *Deleted

## 2014-03-22 DIAGNOSIS — Z23 Encounter for immunization: Secondary | ICD-10-CM

## 2014-03-22 NOTE — Progress Notes (Signed)
Patient ID: Kyle DoryMichael H Haltiwanger, male   DOB: Dec 29, 1960, 53 y.o.   MRN: [id number] Patient seen in office for Influenza Vaccination.   Tolerated IM administration well.

## 2014-07-04 LAB — HM DIABETES EYE EXAM

## 2014-07-05 ENCOUNTER — Ambulatory Visit: Payer: Managed Care, Other (non HMO) | Admitting: Family Medicine

## 2014-07-10 ENCOUNTER — Encounter: Payer: Self-pay | Admitting: Family Medicine

## 2014-07-10 ENCOUNTER — Ambulatory Visit (INDEPENDENT_AMBULATORY_CARE_PROVIDER_SITE_OTHER): Payer: Managed Care, Other (non HMO) | Admitting: Family Medicine

## 2014-07-10 VITALS — BP 90/60 | HR 62 | Temp 98.0°F | Resp 16 | Ht 67.0 in | Wt 209.0 lb

## 2014-07-10 DIAGNOSIS — E119 Type 2 diabetes mellitus without complications: Secondary | ICD-10-CM

## 2014-07-10 DIAGNOSIS — E785 Hyperlipidemia, unspecified: Secondary | ICD-10-CM

## 2014-07-10 DIAGNOSIS — J019 Acute sinusitis, unspecified: Secondary | ICD-10-CM

## 2014-07-10 DIAGNOSIS — I10 Essential (primary) hypertension: Secondary | ICD-10-CM

## 2014-07-10 LAB — LIPID PANEL
CHOLESTEROL: 182 mg/dL (ref 0–200)
HDL: 45 mg/dL (ref >39)
LDL CALC: 113 mg/dL — AB (ref 0–99)
Total CHOL/HDL Ratio: 4 Ratio
Triglycerides: 118 mg/dL (ref <150)
VLDL: 24 mg/dL (ref 0–40)

## 2014-07-10 LAB — COMPLETE METABOLIC PANEL WITH GFR
ALBUMIN: 4.4 g/dL (ref 3.5–5.2)
ALK PHOS: 62 U/L (ref 39–117)
ALT: 27 U/L (ref 0–53)
AST: 20 U/L (ref 0–37)
BUN: 20 mg/dL (ref 6–23)
CO2: 24 mEq/L (ref 19–32)
Calcium: 9.4 mg/dL (ref 8.4–10.5)
Chloride: 100 mEq/L (ref 96–112)
Creat: 1.02 mg/dL (ref 0.50–1.35)
GFR, Est Non African American: 84 mL/min
Glucose, Bld: 161 mg/dL — ABNORMAL HIGH (ref 70–99)
POTASSIUM: 4.3 meq/L (ref 3.5–5.3)
SODIUM: 135 meq/L (ref 135–145)
Total Bilirubin: 0.6 mg/dL (ref 0.2–1.2)
Total Protein: 7 g/dL (ref 6.0–8.3)

## 2014-07-10 LAB — HEMOGLOBIN A1C
Hgb A1c MFr Bld: 7.4 % — ABNORMAL HIGH (ref <5.7)
Mean Plasma Glucose: 166 mg/dL — ABNORMAL HIGH (ref <117)

## 2014-07-10 LAB — MICROALBUMIN, URINE: Microalb, Ur: 0.5 mg/dL (ref <2.0)

## 2014-07-10 NOTE — Progress Notes (Signed)
Subjective:    Patient ID: Kyle Ray, male    DOB: 07-Feb-1961, 54 y.o.   MRN: [id number]  HPI Patient is here today for follow-up of his diabetes mellitus type 2, his hyperlipidemia, and his hypertension. Since his last office visit he has gained approximately 10 pounds. He is still watching his diet. We discontinued glipizide after his last office visit. He states his fasting blood sugars in the morning are typically around 100. The highest he is seen his blood sugar has been 140. He denies any polyuria, polydipsia, or blurred vision. He denies any hypoglycemic episodes. He does complain of dizziness at times. His blood pressure is low today at 90/60. He is currently on lisinopril mainly for renal protection due to his diabetes. This is something that may need to be discontinued as the patient does have episodes of dizziness. He also complains of 2 days of sinus pressure in his frontal and maxillary sinuses, headache, dizziness, and facial pain. He denies any myalgias or right upper quadrant pain. He denies any chest pain shortness of breath or dyspnea on exertion. Diabetic eye exam was performed yesterday and is normal. Past Medical History  Diagnosis Date  . Hypertension   . Diabetes mellitus   . Hyperlipidemia   . Allergy     allergic rhinitis  . Obesity   . Elevated LFTs    No past surgical history on file. Current Outpatient Prescriptions on File Prior to Visit  Medication Sig Dispense Refill  . aspirin 81 MG tablet Take 81 mg by mouth at bedtime.     . cholecalciferol (VITAMIN D) 1000 UNITS tablet Take 1,000 Units by mouth at bedtime.      Marland Kitchen. Cod Liver Oil 1000 MG CAPS Take 1 capsule by mouth daily.    . fish oil-omega-3 fatty acids 1000 MG capsule Take 1 g by mouth 2 (two) times daily. 1 in am, 2 in pm    . JANUVIA 100 MG tablet TAKE 1 TABLET (100 MG TOTAL) BY MOUTH EVERY EVENING. 90 tablet 2  . lisinopril (PRINIVIL,ZESTRIL) 10 MG tablet Take 1 tablet (10 mg total) by mouth  daily. 90 tablet 3  . metFORMIN (GLUCOPHAGE) 1000 MG tablet TAKE (1) TABLET BY MOUTH TWICE DAILY. 180 tablet 3  . Multiple Vitamins-Minerals (MULTIVITAMIN PO) Take 1 tablet by mouth daily.    . pravastatin (PRAVACHOL) 20 MG tablet TAKE 1 TABLET (20 MG TOTAL) BY MOUTH AT BEDTIME. 90 tablet 1  . pyridOXINE (VITAMIN B-6) 100 MG tablet Take 100 mg by mouth daily.      . sitaGLIPtin (JANUVIA) 100 MG tablet Take 100 mg by mouth daily.    . vitamin C (ASCORBIC ACID) 500 MG tablet Take 500 mg by mouth at bedtime.       No current facility-administered medications on file prior to visit.   No Known Allergies History   Social History  . Marital Status: Married    Spouse Name: N/A  . Number of Children: N/A  . Years of Education: N/A   Occupational History  . Not on file.   Social History Main Topics  . Smoking status: Never Smoker   . Smokeless tobacco: Former NeurosurgeonUser    Types: Chew  . Alcohol Use: No  . Drug Use: No     Comment: Used "cocaine" as young adult  . Sexual Activity: Not on file   Other Topics Concern  . Not on file   Social History Narrative   Family History  Problem Relation Age of Onset  . Hyperlipidemia Father   . Hypertension Father   . Heart disease Father   . Alcohol abuse Father       Review of Systems  All other systems reviewed and are negative.      Objective:   Physical Exam  Constitutional: He appears well-developed and well-nourished. No distress.  HENT:  Right Ear: External ear normal.  Left Ear: External ear normal.  Nose: Nose normal.  Mouth/Throat: Oropharynx is clear and moist.  Eyes: Conjunctivae are normal. No scleral icterus.  Neck: Neck supple. No JVD present. No thyromegaly present.  Cardiovascular: Normal rate, regular rhythm, normal heart sounds and intact distal pulses.   No murmur heard. Pulmonary/Chest: Effort normal and breath sounds normal. No respiratory distress. He has no wheezes. He has no rales.  Abdominal: Soft. Bowel  sounds are normal. He exhibits no distension and no mass. There is no tenderness. There is no rebound and no guarding.  Musculoskeletal: He exhibits no edema.  Lymphadenopathy:    He has no cervical adenopathy.  Skin: He is not diaphoretic.  Vitals reviewed.         Assessment & Plan:  Diabetes mellitus type II, controlled - Plan: COMPLETE METABOLIC PANEL WITH GFR, Lipid panel, Hemoglobin A1c, Microalbumin, urine  Essential hypertension  HLD (hyperlipidemia)  Acute rhinosinusitis  We will check a hemoglobin A1c regarding the patient's blood sugar. Goal hemoglobin A1c is less than 6.5. If it is less than 6, I will next discontinue Januvia.  Patient's blood pressures actually low today. I will check a urine microalbumin and if normal I will discontinue the patient's lisinopril. I recommended Sudafed for his sinusitis. Symptoms are no better by Friday I would call out amoxicillin. I will check a fasting lipid panel. Goal LDL cholesterol is less than 100. The remainder of his diabetic foot exam is normal. His diabetic eye exam is up-to-date and normal. He is compliant with his aspirin.

## 2014-07-12 ENCOUNTER — Telehealth: Payer: Self-pay | Admitting: Physician Assistant

## 2014-07-12 NOTE — Telephone Encounter (Signed)
Patient is calling about lab results and when he should come back in  [phone]

## 2014-07-13 MED ORDER — EMPAGLIFLOZIN 25 MG PO TABS: 25.0000 mg | ORAL_TABLET | Freq: Every day | ORAL | Status: DC

## 2014-07-13 NOTE — Telephone Encounter (Signed)
Pt aware of lab results and new script sent in to his pharmacy

## 2014-07-25 ENCOUNTER — Encounter: Payer: Self-pay | Admitting: Family Medicine

## 2014-07-27 ENCOUNTER — Telehealth: Payer: Self-pay | Admitting: Physician Assistant

## 2014-07-27 MED ORDER — GLUCOSE BLOOD VI STRP: ORAL_STRIP | Status: AC

## 2014-07-27 NOTE — Telephone Encounter (Signed)
Pt needing rx for test strips - he uses one touch ultra and check bs bid and [facility]. Strips sent to pharm.

## 2014-08-20 ENCOUNTER — Ambulatory Visit: Payer: Managed Care, Other (non HMO) | Admitting: Family Medicine

## 2014-08-21 ENCOUNTER — Ambulatory Visit: Payer: Managed Care, Other (non HMO) | Admitting: Family Medicine

## 2014-09-03 ENCOUNTER — Encounter: Payer: Self-pay | Admitting: Family Medicine

## 2014-09-03 ENCOUNTER — Ambulatory Visit (INDEPENDENT_AMBULATORY_CARE_PROVIDER_SITE_OTHER): Payer: Managed Care, Other (non HMO) | Admitting: Family Medicine

## 2014-09-03 VITALS — BP 100/68 | HR 68 | Temp 97.8°F | Resp 18 | Ht 67.0 in | Wt 203.0 lb

## 2014-09-03 DIAGNOSIS — M6530 Trigger finger, unspecified finger: Secondary | ICD-10-CM

## 2014-09-03 NOTE — Progress Notes (Signed)
Subjective:    Patient ID: Kyle Ray, male    DOB: [birth year], 54 y.o.   MRN: [id number]  HPI Patient has a history of a trigger finger in his right fourth MCP joint. This eventually required surgical correction. He had failed 2 previous cortisone injections at that joint under the care of a hand surgeon. He is now starting to develop similar symptoms in his right third MCP joint. He complains of pain in the volar surface of the joint. He complains of locking of the third MCP joint. He will flex the joint and have a difficult time extending the joint. He complains of pain and weakness in that joint. He denies any specific injury. This is been gradual onset. He is tried arthritis type medication without improvement. He does have some locking on the joint today with passive range of motion. Past Medical History  Diagnosis Date  . Hypertension   . Diabetes mellitus   . Hyperlipidemia   . Allergy     allergic rhinitis  . Obesity   . Elevated LFTs    No past surgical history on file. Current Outpatient Prescriptions on File Prior to Visit  Medication Sig Dispense Refill  . aspirin 81 MG tablet Take 81 mg by mouth at bedtime.     . cholecalciferol (VITAMIN D) 1000 UNITS tablet Take 1,000 Units by mouth at bedtime.      Marland Kitchen. Cod Liver Oil 1000 MG CAPS Take 1 capsule by mouth daily.    . empagliflozin (JARDIANCE) 25 MG TABS tablet Take 25 mg by mouth daily. 30 tablet 3  . fish oil-omega-3 fatty acids 1000 MG capsule Take 1 g by mouth 2 (two) times daily. 1 in am, 2 in pm    . glucose blood test strip Check BS bid and prn  - Pt uses one touch ultra meter 100 each 12  . JANUVIA 100 MG tablet TAKE 1 TABLET (100 MG TOTAL) BY MOUTH EVERY EVENING. 90 tablet 2  . lisinopril (PRINIVIL,ZESTRIL) 10 MG tablet Take 1 tablet (10 mg total) by mouth daily. 90 tablet 3  . metFORMIN (GLUCOPHAGE) 1000 MG tablet TAKE (1) TABLET BY MOUTH TWICE DAILY. 180 tablet 3  . Multiple Vitamins-Minerals (MULTIVITAMIN PO)  Take 1 tablet by mouth daily.    . pravastatin (PRAVACHOL) 20 MG tablet TAKE 1 TABLET (20 MG TOTAL) BY MOUTH AT BEDTIME. 90 tablet 1  . pyridOXINE (VITAMIN B-6) 100 MG tablet Take 100 mg by mouth daily.      . sitaGLIPtin (JANUVIA) 100 MG tablet Take 100 mg by mouth daily.    . vitamin C (ASCORBIC ACID) 500 MG tablet Take 500 mg by mouth at bedtime.       No current facility-administered medications on file prior to visit.   No Known Allergies History   Social History  . Marital Status: Married    Spouse Name: N/A  . Number of Children: N/A  . Years of Education: N/A   Occupational History  . Not on file.   Social History Main Topics  . Smoking status: Never Smoker   . Smokeless tobacco: Former NeurosurgeonUser    Types: Chew  . Alcohol Use: No  . Drug Use: No     Comment: Used "cocaine" as young adult  . Sexual Activity: Not on file   Other Topics Concern  . Not on file   Social History Narrative      Review of Systems  All other systems reviewed and are negative.  Objective:   Physical Exam  Cardiovascular: Normal rate and regular rhythm.   Pulmonary/Chest: Effort normal and breath sounds normal.  Musculoskeletal:       Right hand: He exhibits decreased range of motion and tenderness. He exhibits no swelling. Decreased strength noted.       Hands: Vitals reviewed.         Assessment & Plan:  Trigger finger, acquired  Patient seems to be developing a trigger finger in his right third MCP joint. Using sterile technique, a mixture of 1/2 mL of 0.1% lidocaine and 1/2 mL of 40 mg per mL Kenalog was injected around the tendon just proximal to the flexor crease. Patient tolerated the procedure well without complication. I recommended resting the hand in trying to avoid flexing the MCP joint is much as possible over the next week. I recommended ice 10 minutes twice a day.  Recheck in one week if no better or sooner if worse. If no better I would recommend hand x-rays. I  anticipate that the patient's blood sugar will temporarily increase due to the cortisone injection. However his sugars a been extremely well controlled and I believe we can whether this transient increase without changing his medication

## 2014-11-02 ENCOUNTER — Other Ambulatory Visit: Payer: Self-pay | Admitting: Family Medicine

## 2014-11-02 NOTE — Telephone Encounter (Signed)
Refill appropriate and filled per protocol. 

## 2014-11-05 ENCOUNTER — Other Ambulatory Visit: Payer: Self-pay | Admitting: Family Medicine

## 2014-12-03 ENCOUNTER — Other Ambulatory Visit: Payer: Self-pay | Admitting: Family Medicine

## 2014-12-03 ENCOUNTER — Encounter: Payer: Self-pay | Admitting: Family Medicine

## 2014-12-03 NOTE — Telephone Encounter (Signed)
Medication refill for one time only.  Patient needs to be seen.  Letter sent for patient to call and schedule 

## 2014-12-04 ENCOUNTER — Other Ambulatory Visit: Payer: Self-pay | Admitting: Family Medicine

## 2014-12-04 NOTE — Telephone Encounter (Signed)
Refill appropriate and filled per protocol. 

## 2014-12-25 ENCOUNTER — Ambulatory Visit (INDEPENDENT_AMBULATORY_CARE_PROVIDER_SITE_OTHER): Payer: Managed Care, Other (non HMO) | Admitting: Family Medicine

## 2014-12-25 ENCOUNTER — Encounter: Payer: Self-pay | Admitting: Family Medicine

## 2014-12-25 VITALS — BP 98/60 | HR 68 | Temp 98.0°F | Resp 18 | Ht 67.0 in | Wt 201.0 lb

## 2014-12-25 DIAGNOSIS — E119 Type 2 diabetes mellitus without complications: Secondary | ICD-10-CM

## 2014-12-25 DIAGNOSIS — E785 Hyperlipidemia, unspecified: Secondary | ICD-10-CM

## 2014-12-25 DIAGNOSIS — I10 Essential (primary) hypertension: Secondary | ICD-10-CM

## 2014-12-25 LAB — COMPLETE METABOLIC PANEL WITH GFR
ALT: 24 U/L (ref 9–46)
AST: 20 U/L (ref 10–35)
Albumin: 4.6 g/dL (ref 3.6–5.1)
Alkaline Phosphatase: 63 U/L (ref 40–115)
BUN: 17 mg/dL (ref 7–25)
CALCIUM: 9.6 mg/dL (ref 8.6–10.3)
CO2: 25 mmol/L (ref 20–31)
Chloride: 103 mmol/L (ref 98–110)
Creat: 0.96 mg/dL (ref 0.70–1.33)
GFR, Est African American: >89 mL/min (ref >=60)
GFR, Est Non African American: 89 mL/min (ref >=60)
Glucose, Bld: 115 mg/dL — ABNORMAL HIGH (ref 70–99)
Potassium: 4.5 mmol/L (ref 3.5–5.3)
SODIUM: 138 mmol/L (ref 135–146)
Total Bilirubin: 0.9 mg/dL (ref 0.2–1.2)
Total Protein: 7.1 g/dL (ref 6.1–8.1)

## 2014-12-25 LAB — LIPID PANEL
CHOLESTEROL: 163 mg/dL (ref 125–200)
HDL: 48 mg/dL (ref >=40)
LDL CALC: 101 mg/dL (ref <130)
TRIGLYCERIDES: 68 mg/dL (ref <150)
Total CHOL/HDL Ratio: 3.4 Ratio (ref <=5.0)
VLDL: 14 mg/dL (ref <30)

## 2014-12-25 LAB — HEMOGLOBIN A1C
Hgb A1c MFr Bld: 6.6 % — ABNORMAL HIGH (ref <5.7)
Mean Plasma Glucose: 143 mg/dL — ABNORMAL HIGH (ref <117)

## 2014-12-25 NOTE — Progress Notes (Signed)
Subjective:    Patient ID: Kyle Ray, male    DOB: Jan 04, 1961, 54 y.o.   MRN: [id number]  HPI 2/16 Patient is here today for follow-up of his diabetes mellitus type 2, his hyperlipidemia, and his hypertension. Since his last office visit he has gained approximately 10 pounds. He is still watching his diet. We discontinued glipizide after his last office visit. He states his fasting blood sugars in the morning are typically around 100. The highest he is seen his blood sugar has been 140. He denies any polyuria, polydipsia, or blurred vision. He denies any hypoglycemic episodes. He does complain of dizziness at times. His blood pressure is low today at 90/60. He is currently on lisinopril mainly for renal protection due to his diabetes. This is something that may need to be discontinued as the patient does have episodes of dizziness. He also complains of 2 days of sinus pressure in his frontal and maxillary sinuses, headache, dizziness, and facial pain. He denies any myalgias or right upper quadrant pain. He denies any chest pain shortness of breath or dyspnea on exertion. Diabetic eye exam was performed yesterday and is normal.  At that time, my plan was: We will check a hemoglobin A1c regarding the patient's blood sugar. Goal hemoglobin A1c is less than 6.5. If it is less than 6, I will next discontinue Januvia.  Patient's blood pressures actually low today. I will check a urine microalbumin and if normal I will discontinue the patient's lisinopril. I recommended Sudafed for his sinusitis. Symptoms are no better by Friday I would call out amoxicillin. I will check a fasting lipid panel. Goal LDL cholesterol is less than 100. The remainder of his diabetic foot exam is normal. His diabetic eye exam is up-to-date and normal. He is compliant with his aspirin.  12/25/14 Patient is here today for follow-up. He continues to lose weight. As his weight has declined, says his blood pressure. His blood pressure  today is 98/60. He does report occasional dizziness and orthostatic dizziness. It is particularly worse when he has been working hard and  Has lost a lot of body fluid due to sweating. He was originally on lisinopril 10 mg a day for renal protection due to his diabetes. He denies any chest pain shortness of breath or dyspnea on exertion. He states that his blood  Sugars have been very well controlled. He denies any hypoglycemic episodes. He denies any polyuria, polydipsia, or blurred vision. He denies any myalgias or right upper quadrant pain. Diabetic foot exam today is normal. Diabetic eye exam is up-to-date. Past Medical History  Diagnosis Date  . Hypertension   . Diabetes mellitus   . Hyperlipidemia   . Allergy     allergic rhinitis  . Obesity   . Elevated LFTs    No past surgical history on file. Current Outpatient Prescriptions on File Prior to Visit  Medication Sig Dispense Refill  . aspirin 81 MG tablet Take 81 mg by mouth at bedtime.     . cholecalciferol (VITAMIN D) 1000 UNITS tablet Take 1,000 Units by mouth at bedtime.      Marland Kitchen Cod Liver Oil 1000 MG CAPS Take 1 capsule by mouth daily.    . fish oil-omega-3 fatty acids 1000 MG capsule Take 1 g by mouth 2 (two) times daily. 1 in am, 2 in pm    . glucose blood test strip Check BS bid and prn  - Pt uses one touch ultra meter 100 each 12  .  JANUVIA 100 MG tablet TAKE 1 TABLET (100 MG TOTAL) BY MOUTH EVERY EVENING... INSURANCE ONLY ALLOWS A 30 DAYS SUPPLY 30 tablet 0  . JARDIANCE 25 MG TABS tablet TAKE 1 TABLET BY MOUTH EVERY DAY 30 tablet 3  . lisinopril (PRINIVIL,ZESTRIL) 10 MG tablet Take 1 tablet (10 mg total) by mouth daily. 90 tablet 3  . metFORMIN (GLUCOPHAGE) 1000 MG tablet TAKE 1 TABLET BY MOUTH TWICE DAILY 60 tablet 0  . Multiple Vitamins-Minerals (MULTIVITAMIN PO) Take 1 tablet by mouth daily.    . pravastatin (PRAVACHOL) 20 MG tablet TAKE 1 TABLET (20 MG TOTAL) BY MOUTH AT BEDTIME. 90 tablet 1  . pyridOXINE (VITAMIN B-6) 100  MG tablet Take 100 mg by mouth daily.      . vitamin C (ASCORBIC ACID) 500 MG tablet Take 500 mg by mouth at bedtime.       No current facility-administered medications on file prior to visit.   No Known Allergies History   Social History  . Marital Status: Married    Spouse Name: N/A  . Number of Children: N/A  . Years of Education: N/A   Occupational History  . Not on file.   Social History Main Topics  . Smoking status: Never Smoker   . Smokeless tobacco: Former Neurosurgeon    Types: Chew  . Alcohol Use: No  . Drug Use: No     Comment: Used "cocaine" as young adult  . Sexual Activity: Not on file   Other Topics Concern  . Not on file   Social History Narrative   Family History  Problem Relation Age of Onset  . Hyperlipidemia Father   . Hypertension Father   . Heart disease Father   . Alcohol abuse Father       Review of Systems  All other systems reviewed and are negative.      Objective:   Physical Exam  Constitutional: He appears well-developed and well-nourished. No distress.  HENT:  Right Ear: External ear normal.  Left Ear: External ear normal.  Nose: Nose normal.  Mouth/Throat: Oropharynx is clear and moist.  Eyes: Conjunctivae are normal. No scleral icterus.  Neck: Neck supple. No JVD present. No thyromegaly present.  Cardiovascular: Normal rate, regular rhythm, normal heart sounds and intact distal pulses.   No murmur heard. Pulmonary/Chest: Effort normal and breath sounds normal. No respiratory distress. He has no wheezes. He has no rales.  Abdominal: Soft. Bowel sounds are normal. He exhibits no distension and no mass. There is no tenderness. There is no rebound and no guarding.  Musculoskeletal: He exhibits no edema.  Lymphadenopathy:    He has no cervical adenopathy.  Skin: He is not diaphoretic.  Vitals reviewed.         Assessment & Plan:  Diabetes mellitus type II, controlled - Plan: COMPLETE METABOLIC PANEL WITH GFR, Lipid panel,  Hemoglobin A1c, Microalbumin, urine  Essential hypertension  HLD (hyperlipidemia)  Patient's blood pressure is too low. I will check a microalbumin. If there is no microalbumin in the urine, I will have the patient discontinue lisinopril. If the patient continues to have microalbumin in the urine, I will decrease the Cipro from 10 mg a day to 5 mg a day to try to avoid hypotension. I will also check a fasting lipid panel. Goal LDL cholesterol is less than 100. I will also check a hemoglobin A1c. Goal hemoglobin A1c is less than 6.5. If his A1c is less than 6.5, I will stop the patient's  Januvia due to cost.

## 2014-12-26 LAB — MICROALBUMIN, URINE: MICROALB UR: 0.4 mg/dL (ref <2.0)

## 2015-01-02 ENCOUNTER — Other Ambulatory Visit: Payer: Self-pay | Admitting: Physician Assistant

## 2015-01-02 ENCOUNTER — Telehealth: Payer: Self-pay | Admitting: Physician Assistant

## 2015-01-02 NOTE — Telephone Encounter (Signed)
Pt wanted to know if he could have refills on his metformin - pt was just seen for ov and labs - refilled medication and pt aware

## 2015-01-02 NOTE — Telephone Encounter (Signed)
Patient calling to ask some questions about his refill on metformin  [phone]

## 2015-01-07 ENCOUNTER — Encounter: Payer: Self-pay | Admitting: Physician Assistant

## 2015-02-05 ENCOUNTER — Other Ambulatory Visit: Payer: Self-pay | Admitting: Family Medicine

## 2015-02-05 DIAGNOSIS — J208 Acute bronchitis due to other specified organisms: Secondary | ICD-10-CM

## 2015-02-05 MED ORDER — AZITHROMYCIN 250 MG PO TABS: ORAL_TABLET | ORAL | Status: DC

## 2015-02-09 ENCOUNTER — Other Ambulatory Visit: Payer: Self-pay | Admitting: Family Medicine

## 2015-02-22 ENCOUNTER — Other Ambulatory Visit: Payer: Self-pay | Admitting: Family Medicine

## 2015-03-07 ENCOUNTER — Other Ambulatory Visit: Payer: Self-pay | Admitting: Family Medicine

## 2015-03-07 NOTE — Telephone Encounter (Signed)
Refill appropriate and filled per protocol. 

## 2015-04-11 ENCOUNTER — Telehealth: Payer: Self-pay | Admitting: Physician Assistant

## 2015-04-11 NOTE — Telephone Encounter (Signed)
Pharmacy: Pick up-- Patient called in states that he can't afford the medication below. He states that Dr. Tanya NonesPickard told him that we had coupons to help with the price. He would like to come and pick up.  Medication: JARDIANCE 25 MG TABS tablet   Physician:Dr. Pickard   Patient's phone number: [phone]

## 2015-04-11 NOTE — Telephone Encounter (Signed)
Patient co-pay card placed up front for patient to pick up.

## 2015-06-11 ENCOUNTER — Telehealth: Payer: Self-pay | Admitting: Physician Assistant

## 2015-06-11 NOTE — Telephone Encounter (Addendum)
Patient is calling to speak to you about his jardiance being so expensive please call him at [phone] The meds that might be covered are invokana or sarxiga

## 2015-06-12 NOTE — Telephone Encounter (Signed)
Pt was given samples to last him until his appt in Feb

## 2015-06-28 ENCOUNTER — Other Ambulatory Visit: Payer: Managed Care, Other (non HMO)

## 2015-06-28 DIAGNOSIS — IMO0001 Reserved for inherently not codable concepts without codable children: Secondary | ICD-10-CM

## 2015-06-28 DIAGNOSIS — E1165 Type 2 diabetes mellitus with hyperglycemia: Secondary | ICD-10-CM

## 2015-06-28 DIAGNOSIS — Z79899 Other long term (current) drug therapy: Secondary | ICD-10-CM

## 2015-06-28 DIAGNOSIS — I10 Essential (primary) hypertension: Secondary | ICD-10-CM

## 2015-06-28 DIAGNOSIS — E785 Hyperlipidemia, unspecified: Secondary | ICD-10-CM

## 2015-06-28 LAB — CBC WITH DIFFERENTIAL/PLATELET
BASOS PCT: 0 % (ref 0–1)
Basophils Absolute: 0 10*3/uL (ref 0.0–0.1)
EOS ABS: 0.2 10*3/uL (ref 0.0–0.7)
Eosinophils Relative: 3 % (ref 0–5)
HCT: 47.3 % (ref 39.0–52.0)
Hemoglobin: 15.7 g/dL (ref 13.0–17.0)
LYMPHS ABS: 2.1 10*3/uL (ref 0.7–4.0)
Lymphocytes Relative: 35 % (ref 12–46)
MCH: 30.6 pg (ref 26.0–34.0)
MCHC: 33.2 g/dL (ref 30.0–36.0)
MCV: 92.2 fL (ref 78.0–100.0)
MONO ABS: 0.5 10*3/uL (ref 0.1–1.0)
MPV: 9.7 fL (ref 8.6–12.4)
Monocytes Relative: 9 % (ref 3–12)
NEUTROS ABS: 3.2 10*3/uL (ref 1.7–7.7)
Neutrophils Relative %: 53 % (ref 43–77)
Platelets: 161 10*3/uL (ref 150–400)
RBC: 5.13 MIL/uL (ref 4.22–5.81)
RDW: 13.7 % (ref 11.5–15.5)
WBC: 6 10*3/uL (ref 4.0–10.5)

## 2015-06-28 LAB — COMPLETE METABOLIC PANEL WITH GFR
ALT: 26 U/L (ref 9–46)
AST: 25 U/L (ref 10–35)
Albumin: 4.6 g/dL (ref 3.6–5.1)
Alkaline Phosphatase: 75 U/L (ref 40–115)
BILIRUBIN TOTAL: 1.1 mg/dL (ref 0.2–1.2)
BUN: 14 mg/dL (ref 7–25)
CALCIUM: 9.7 mg/dL (ref 8.6–10.3)
CO2: 27 mmol/L (ref 20–31)
Chloride: 100 mmol/L (ref 98–110)
Creat: 0.94 mg/dL (ref 0.70–1.33)
GFR, Est African American: >89 mL/min (ref >=60)
GLUCOSE: 123 mg/dL — AB (ref 70–99)
POTASSIUM: 4.2 mmol/L (ref 3.5–5.3)
SODIUM: 141 mmol/L (ref 135–146)
Total Protein: 7.2 g/dL (ref 6.1–8.1)

## 2015-06-28 LAB — LIPID PANEL
CHOL/HDL RATIO: 3.4 ratio (ref <=5.0)
CHOLESTEROL: 159 mg/dL (ref 125–200)
HDL: 47 mg/dL (ref >=40)
LDL Cholesterol: 97 mg/dL (ref <130)
TRIGLYCERIDES: 73 mg/dL (ref <150)
VLDL: 15 mg/dL (ref <30)

## 2015-06-28 LAB — HEMOGLOBIN A1C
HEMOGLOBIN A1C: 6.8 % — AB (ref <5.7)
Mean Plasma Glucose: 148 mg/dL — ABNORMAL HIGH (ref <117)

## 2015-07-12 ENCOUNTER — Ambulatory Visit (INDEPENDENT_AMBULATORY_CARE_PROVIDER_SITE_OTHER): Payer: Managed Care, Other (non HMO) | Admitting: Family Medicine

## 2015-07-12 ENCOUNTER — Encounter: Payer: Self-pay | Admitting: Family Medicine

## 2015-07-12 VITALS — BP 140/78 | HR 80 | Temp 98.4°F | Resp 16 | Ht 67.0 in | Wt 204.0 lb

## 2015-07-12 DIAGNOSIS — E119 Type 2 diabetes mellitus without complications: Secondary | ICD-10-CM

## 2015-07-12 DIAGNOSIS — I10 Essential (primary) hypertension: Secondary | ICD-10-CM

## 2015-07-12 DIAGNOSIS — E785 Hyperlipidemia, unspecified: Secondary | ICD-10-CM

## 2015-07-12 DIAGNOSIS — Z0000 Encounter for general adult medical examination without abnormal findings: Secondary | ICD-10-CM

## 2015-07-12 MED ORDER — MELOXICAM 15 MG PO TABS: 15.0000 mg | ORAL_TABLET | Freq: Every day | ORAL | Status: DC

## 2015-07-12 NOTE — Progress Notes (Signed)
Subjective:    Patient ID: Kyle Ray, male    DOB: [birth year], 55 y.o.   MRN: [id number]  HPI 2/16 Patient is here today for follow-up of his diabetes mellitus type 2, his hyperlipidemia, and his hypertension. Since his last office visit he has gained approximately 10 pounds. He is still watching his diet. We discontinued glipizide after his last office visit. He states his fasting blood sugars in the morning are typically around 100. The highest he is seen his blood sugar has been 140. He denies any polyuria, polydipsia, or blurred vision. He denies any hypoglycemic episodes. He does complain of dizziness at times. His blood pressure is low today at 90/60. He is currently on lisinopril mainly for renal protection due to his diabetes. This is something that may need to be discontinued as the patient does have episodes of dizziness. He also complains of 2 days of sinus pressure in his frontal and maxillary sinuses, headache, dizziness, and facial pain. He denies any myalgias or right upper quadrant pain. He denies any chest pain shortness of breath or dyspnea on exertion. Diabetic eye exam was performed yesterday and is normal.  At that time, my plan was: We will check a hemoglobin A1c regarding the patient's blood sugar. Goal hemoglobin A1c is less than 6.5. If it is less than 6, I will next discontinue Januvia.  Patient's blood pressures actually low today. I will check a urine microalbumin and if normal I will discontinue the patient's lisinopril. I recommended Sudafed for his sinusitis. Symptoms are no better by Friday I would call out amoxicillin. I will check a fasting lipid panel. Goal LDL cholesterol is less than 100. The remainder of his diabetic foot exam is normal. His diabetic eye exam is up-to-date and normal. He is compliant with his aspirin.  12/25/14 Patient is here today for follow-up. He continues to lose weight. As his weight has declined, says his blood pressure. His blood pressure  today is 98/60. He does report occasional dizziness and orthostatic dizziness. It is particularly worse when he has been working hard and  Has lost a lot of body fluid due to sweating. He was originally on lisinopril 10 mg a day for renal protection due to his diabetes. He denies any chest pain shortness of breath or dyspnea on exertion. He states that his blood  Sugars have been very well controlled. He denies any hypoglycemic episodes. He denies any polyuria, polydipsia, or blurred vision. He denies any myalgias or right upper quadrant pain. Diabetic foot exam today is normal. Diabetic eye exam is up-to-date.  At that time, my plan was: Patient's blood pressure is too low. I will check a microalbumin. If there is no microalbumin in the urine, I will have the patient discontinue lisinopril. If the patient continues to have microalbumin in the urine, I will decrease the Cipro from 10 mg a day to 5 mg a day to try to avoid hypotension. I will also check a fasting lipid panel. Goal LDL cholesterol is less than 100. I will also check a hemoglobin A1c. Goal hemoglobin A1c is less than 6.5. If his A1c is less than 6.5, I will stop the patient's Januvia due to cost.  07/12/15 He is here today for complete physical exam. His diabetic eye exam is scheduled for next month. Diabetic foot exam is performed today is completely normal. Since I last saw  the patient, his insurance declined Jardiance and he was switched to invokana.  He is maintaining  his weight loss. His hemoglobin A1c is acceptable as shown below. He denies any chest pain shortness of breath or dyspnea on exertion. He denies any myalgias or right upper quadrant pain. He denies any blurry vision polyuria or polydipsia. He denies any hypoglycemia. He is due for his prostate exam today. Lab on 06/28/2015  Component Date Value Ref Range Status  . Sodium 06/28/2015 141  135 - 146 mmol/L Final  . Potassium 06/28/2015 4.2  3.5 - 5.3 mmol/L Final  . Chloride  06/28/2015 100  98 - 110 mmol/L Final  . CO2 06/28/2015 27  20 - 31 mmol/L Final  . Glucose, Bld 06/28/2015 123* 70 - 99 mg/dL Final  . BUN 06/28/2015 14  7 - 25 mg/dL Final  . Creat 06/28/2015 0.94  0.70 - 1.33 mg/dL Final  . Total Bilirubin 06/28/2015 1.1  0.2 - 1.2 mg/dL Final  . Alkaline Phosphatase 06/28/2015 75  40 - 115 U/L Final  . AST 06/28/2015 25  10 - 35 U/L Final  . ALT 06/28/2015 26  9 - 46 U/L Final  . Total Protein 06/28/2015 7.2  6.1 - 8.1 g/dL Final  . Albumin 06/28/2015 4.6  3.6 - 5.1 g/dL Final  . Calcium 06/28/2015 9.7  8.6 - 10.3 mg/dL Final  . GFR, Est African American 06/28/2015 >89  >=60 mL/min Final  . GFR, Est Non African American 06/28/2015 >89  >=60 mL/min Final   Comment:   The estimated GFR is a calculation valid for adults (>=55 years old) that uses the CKD-EPI algorithm to adjust for age and sex. It is   not to be used for children, pregnant women, hospitalized patients,    patients on dialysis, or with rapidly changing kidney function. According to the NKDEP, eGFR >89 is normal, 60-89 shows mild impairment, 30-59 shows moderate impairment, 15-29 shows severe impairment and <15 is ESRD.     Marland Kitchen Cholesterol 06/28/2015 159  125 - 200 mg/dL Final  . Triglycerides 06/28/2015 73  <150 mg/dL Final  . HDL 06/28/2015 47  >=40 mg/dL Final  . Total CHOL/HDL Ratio 06/28/2015 3.4  <=5.0 Ratio Final  . VLDL 06/28/2015 15  <30 mg/dL Final  . LDL Cholesterol 06/28/2015 97  <130 mg/dL Final   Comment:   Total Cholesterol/HDL Ratio:CHD Risk                        Coronary Heart Disease Risk Table                                        Men       Women          1/2 Average Risk              3.4        3.3              Average Risk              5.0        4.4           2X Average Risk              9.6        7.1           3X Average Risk  23.4       11.0 Use the calculated Patient Ratio above and the CHD Risk table  to determine the patient's CHD Risk.     . WBC 06/28/2015 6.0  4.0 - 10.5 K/uL Final  . RBC 06/28/2015 5.13  4.22 - 5.81 MIL/uL Final  . Hemoglobin 06/28/2015 15.7  13.0 - 17.0 g/dL Final  . HCT 06/28/2015 47.3  39.0 - 52.0 % Final  . MCV 06/28/2015 92.2  78.0 - 100.0 fL Final  . MCH 06/28/2015 30.6  26.0 - 34.0 pg Final  . MCHC 06/28/2015 33.2  30.0 - 36.0 g/dL Final  . RDW 06/28/2015 13.7  11.5 - 15.5 % Final  . Platelets 06/28/2015 161  150 - 400 K/uL Final  . MPV 06/28/2015 9.7  8.6 - 12.4 fL Final  . Neutrophils Relative % 06/28/2015 53  43 - 77 % Final  . Neutro Abs 06/28/2015 3.2  1.7 - 7.7 K/uL Final  . Lymphocytes Relative 06/28/2015 35  12 - 46 % Final  . Lymphs Abs 06/28/2015 2.1  0.7 - 4.0 K/uL Final  . Monocytes Relative 06/28/2015 9  3 - 12 % Final  . Monocytes Absolute 06/28/2015 0.5  0.1 - 1.0 K/uL Final  . Eosinophils Relative 06/28/2015 3  0 - 5 % Final  . Eosinophils Absolute 06/28/2015 0.2  0.0 - 0.7 K/uL Final  . Basophils Relative 06/28/2015 0  0 - 1 % Final  . Basophils Absolute 06/28/2015 0.0  0.0 - 0.1 K/uL Final  . Smear Review 06/28/2015 Criteria for review not met   Final  . Hgb A1c MFr Bld 06/28/2015 6.8* <5.7 % Final   Comment:                                                                        According to the ADA Clinical Practice Recommendations for 2011, when HbA1c is used as a screening test:     >=6.5%   Diagnostic of Diabetes Mellitus            (if abnormal result is confirmed)   5.7-6.4%   Increased risk of developing Diabetes Mellitus   References:Diagnosis and Classification of Diabetes Mellitus,Diabetes ELFY,1017,51(WCHEN 1):S62-S69 and Standards of Medical Care in         Diabetes - 2011,Diabetes [id number] (Suppl 1):S11-S61.     . Mean Plasma Glucose 06/28/2015 148* <117 mg/dL Final    Past Medical History  Diagnosis Date  . Hypertension   . Diabetes mellitus   . Hyperlipidemia   . Allergy     allergic rhinitis  . Obesity   . Elevated LFTs    No past  surgical history on file. Current Outpatient Prescriptions on File Prior to Visit  Medication Sig Dispense Refill  . aspirin 81 MG tablet Take 81 mg by mouth at bedtime.     . cholecalciferol (VITAMIN D) 1000 UNITS tablet Take 1,000 Units by mouth at bedtime.      Marland Kitchen Cod Liver Oil 1000 MG CAPS Take 1 capsule by mouth daily.    . fish oil-omega-3 fatty acids 1000 MG capsule Take 1 g by mouth 2 (two) times daily. 1 in am, 2 in pm    . glucose blood test  strip Check BS bid and prn  - Pt uses one touch ultra meter 100 each 12  . JANUVIA 100 MG tablet TAKE 1 TABLET (100 MG TOTAL) BY MOUTH EVERY EVENING... INSURANCE ONLY ALLOWS A 30 DAYS SUPPLY 30 tablet 5  . lisinopril (PRINIVIL,ZESTRIL) 10 MG tablet TAKE ONE TABLET BY MOUTH DAILY 90 tablet 0  . metFORMIN (GLUCOPHAGE) 1000 MG tablet TAKE 1 TABLET BY MOUTH TWICE DAILY 60 tablet 5  . Multiple Vitamins-Minerals (MULTIVITAMIN PO) Take 1 tablet by mouth daily.    . pravastatin (PRAVACHOL) 20 MG tablet TAKE 1 TABLET (20 MG TOTAL) BY MOUTH AT BEDTIME. 90 tablet 1  . pyridOXINE (VITAMIN B-6) 100 MG tablet Take 100 mg by mouth daily.      . vitamin C (ASCORBIC ACID) 500 MG tablet Take 500 mg by mouth at bedtime.       No current facility-administered medications on file prior to visit.   No Known Allergies Social History   Social History  . Marital Status: Married    Spouse Name: N/A  . Number of Children: N/A  . Years of Education: N/A   Occupational History  . Not on file.   Social History Main Topics  . Smoking status: Never Smoker   . Smokeless tobacco: Former Systems developer    Types: Chew  . Alcohol Use: No  . Drug Use: No     Comment: Used "cocaine" as young adult  . Sexual Activity: Not on file   Other Topics Concern  . Not on file   Social History Narrative   Family History  Problem Relation Age of Onset  . Hyperlipidemia Father   . Hypertension Father   . Heart disease Father   . Alcohol abuse Father       Review of Systems   All other systems reviewed and are negative.      Objective:   Physical Exam  Constitutional: He is oriented to person, place, and time. He appears well-developed and well-nourished. No distress.  HENT:  Head: Normocephalic and atraumatic.  Right Ear: External ear normal.  Left Ear: External ear normal.  Nose: Nose normal.  Mouth/Throat: Oropharynx is clear and moist.  Eyes: Conjunctivae and EOM are normal. Pupils are equal, round, and reactive to light. Right eye exhibits no discharge. Left eye exhibits no discharge. No scleral icterus.  Neck: Normal range of motion. Neck supple. No JVD present. No tracheal deviation present. No thyromegaly present.  Cardiovascular: Normal rate, regular rhythm, normal heart sounds and intact distal pulses.  Exam reveals no gallop and no friction rub.   No murmur heard. Pulmonary/Chest: Effort normal and breath sounds normal. No stridor. No respiratory distress. He has no wheezes. He has no rales.  Abdominal: Soft. Bowel sounds are normal. He exhibits no distension and no mass. There is no tenderness. There is no rebound and no guarding.  Genitourinary: Rectum normal and prostate normal. Guaiac negative stool. No penile tenderness.  Musculoskeletal: Normal range of motion. He exhibits no edema or tenderness.  Lymphadenopathy:    He has no cervical adenopathy.  Neurological: He is alert and oriented to person, place, and time. He has normal reflexes. He displays normal reflexes. No cranial nerve deficit. He exhibits normal muscle tone. Coordination normal.  Skin: Skin is warm and dry. No rash noted. He is not diaphoretic. No erythema. No pallor.  Psychiatric: He has a normal mood and affect. His behavior is normal. Judgment and thought content normal.  Vitals reviewed.  Assessment & Plan:  Controlled type 2 diabetes mellitus without complication, without long-term current use of insulin (HCC)  Essential hypertension  HLD  (hyperlipidemia)  Routine general medical examination at a health care facility  Physical exam today is normal. I recommended diet exercise and weight loss. Blood sugar and cholesterol are adequately controlled. Blood pressure today is borderline. Diabetic foot exam is up-to-date. Diabetic eye exam will be performed next month. Prostate exam today is completely normal there is no evidence of prostate cancer. Regular anticipatory guidance is provided.

## 2015-07-23 ENCOUNTER — Other Ambulatory Visit: Payer: Self-pay | Admitting: Family Medicine

## 2015-07-23 NOTE — Telephone Encounter (Signed)
Medication refilled per protocol. 

## 2015-08-07 ENCOUNTER — Other Ambulatory Visit: Payer: Self-pay | Admitting: Family Medicine

## 2015-10-04 ENCOUNTER — Telehealth: Payer: Self-pay | Admitting: Physician Assistant

## 2015-10-04 NOTE — Telephone Encounter (Signed)
Patient is calling to see if there are any samples of invokana that he can get  [phone]

## 2015-10-07 NOTE — Telephone Encounter (Signed)
Samples left up front and pt aware via vm 

## 2015-10-15 ENCOUNTER — Telehealth: Payer: Self-pay | Admitting: Physician Assistant

## 2015-10-15 DIAGNOSIS — M6530 Trigger finger, unspecified finger: Secondary | ICD-10-CM

## 2015-10-15 NOTE — Telephone Encounter (Signed)
Pt [facility] to have injections for his trigger finger.  Please advise [phone]

## 2015-10-23 NOTE — Telephone Encounter (Signed)
Please advise if okay for referral 

## 2015-10-24 NOTE — Telephone Encounter (Signed)
Referral placed.

## 2015-10-24 NOTE — Telephone Encounter (Signed)
Approved. Place order for Referral to [facility]--make sure they schedule with one of their doctors that specializes in hands.

## 2015-10-28 ENCOUNTER — Other Ambulatory Visit: Payer: Self-pay | Admitting: Physician Assistant

## 2015-10-28 MED ORDER — CANAGLIFLOZIN 300 MG PO TABS: 300.0000 mg | ORAL_TABLET | Freq: Every day | ORAL | Status: DC

## 2015-10-28 NOTE — Telephone Encounter (Signed)
Invokana samples given.

## 2015-10-28 NOTE — Telephone Encounter (Signed)
Patient would like samples of invokana if we have them  [phone]

## 2015-12-05 ENCOUNTER — Telehealth: Payer: Self-pay | Admitting: Physician Assistant

## 2015-12-05 NOTE — Telephone Encounter (Signed)
Patient would like to come by in about 2 hours if possible and get samples of invokana if possible  [phone] (H)

## 2015-12-05 NOTE — Telephone Encounter (Signed)
Samples given to pt in office.

## 2016-01-06 ENCOUNTER — Other Ambulatory Visit: Payer: Managed Care, Other (non HMO)

## 2016-01-06 DIAGNOSIS — I10 Essential (primary) hypertension: Secondary | ICD-10-CM

## 2016-01-06 DIAGNOSIS — E785 Hyperlipidemia, unspecified: Secondary | ICD-10-CM

## 2016-01-06 DIAGNOSIS — Z79899 Other long term (current) drug therapy: Secondary | ICD-10-CM

## 2016-01-06 DIAGNOSIS — IMO0001 Reserved for inherently not codable concepts without codable children: Secondary | ICD-10-CM

## 2016-01-06 DIAGNOSIS — E1165 Type 2 diabetes mellitus with hyperglycemia: Principal | ICD-10-CM

## 2016-01-06 LAB — COMPLETE METABOLIC PANEL WITH GFR
ALT: 29 U/L (ref 9–46)
AST: 23 U/L (ref 10–35)
Albumin: 4.6 g/dL (ref 3.6–5.1)
Alkaline Phosphatase: 77 U/L (ref 40–115)
BILIRUBIN TOTAL: 1.2 mg/dL (ref 0.2–1.2)
BUN: 14 mg/dL (ref 7–25)
CALCIUM: 9.5 mg/dL (ref 8.6–10.3)
CO2: 25 mmol/L (ref 20–31)
Chloride: 100 mmol/L (ref 98–110)
Creat: 0.97 mg/dL (ref 0.70–1.33)
GFR, EST NON AFRICAN AMERICAN: 88 mL/min (ref >=60)
GFR, Est African American: >89 mL/min (ref >=60)
Glucose, Bld: 117 mg/dL — ABNORMAL HIGH (ref 70–99)
POTASSIUM: 4 mmol/L (ref 3.5–5.3)
Sodium: 138 mmol/L (ref 135–146)
Total Protein: 7.3 g/dL (ref 6.1–8.1)

## 2016-01-06 LAB — CBC WITH DIFFERENTIAL/PLATELET
BASOS ABS: 0 {cells}/uL (ref 0–200)
BASOS PCT: 0 %
Eosinophils Absolute: 142 cells/uL (ref 15–500)
Eosinophils Relative: 2 %
HCT: 46.3 % (ref 38.5–50.0)
HEMOGLOBIN: 15.5 g/dL (ref 13.0–17.0)
Lymphs Abs: 2201 cells/uL (ref 850–3900)
MCH: 31.3 pg (ref 27.0–33.0)
MCHC: 33.5 g/dL (ref 32.0–36.0)
MCV: 93.3 fL (ref 80.0–100.0)
MONO ABS: 781 {cells}/uL (ref 200–950)
MPV: 9.6 fL (ref 7.5–12.5)
Monocytes Relative: 11 %
NEUTROS PCT: 56 %
Neutro Abs: 3976 cells/uL (ref 1500–7800)
Platelets: 157 10*3/uL (ref 140–400)
RBC: 4.96 MIL/uL (ref 4.20–5.80)
RDW: 13.5 % (ref 11.0–15.0)
WBC: 7.1 10*3/uL (ref 3.8–10.8)

## 2016-01-06 LAB — LIPID PANEL
Cholesterol: 161 mg/dL (ref 125–200)
HDL: 49 mg/dL (ref >=40)
LDL CALC: 99 mg/dL (ref <130)
TRIGLYCERIDES: 66 mg/dL (ref <150)
Total CHOL/HDL Ratio: 3.3 Ratio (ref <=5.0)
VLDL: 13 mg/dL (ref <30)

## 2016-01-06 LAB — TSH: TSH: 1.51 m[IU]/L (ref 0.40–4.50)

## 2016-01-07 LAB — HEMOGLOBIN A1C
Hgb A1c MFr Bld: 6.8 % — ABNORMAL HIGH (ref <5.7)
Mean Plasma Glucose: 148 mg/dL

## 2016-01-10 ENCOUNTER — Ambulatory Visit (INDEPENDENT_AMBULATORY_CARE_PROVIDER_SITE_OTHER): Payer: Managed Care, Other (non HMO) | Admitting: Family Medicine

## 2016-01-10 ENCOUNTER — Encounter: Payer: Self-pay | Admitting: Family Medicine

## 2016-01-10 VITALS — BP 138/80 | HR 74 | Temp 98.2°F | Resp 16 | Ht 67.0 in | Wt 204.0 lb

## 2016-01-10 DIAGNOSIS — E785 Hyperlipidemia, unspecified: Secondary | ICD-10-CM | POA: Diagnosis not present

## 2016-01-10 DIAGNOSIS — E119 Type 2 diabetes mellitus without complications: Secondary | ICD-10-CM

## 2016-01-10 DIAGNOSIS — I10 Essential (primary) hypertension: Secondary | ICD-10-CM

## 2016-01-10 NOTE — Progress Notes (Signed)
Subjective:    Patient ID: Kyle Ray, male    DOB: 1960/11/06, 55 y.o.   MRN: [id number]  HPI2/16 Patient is here today for follow-up of his diabetes mellitus type 2, his hyperlipidemia, and his hypertension. Since his last office visit he has gained approximately 10 pounds. He is still watching his diet. We discontinued glipizide after his last office visit. He states his fasting blood sugars in the morning are typically around 100. The highest he is seen his blood sugar has been 140. He denies any polyuria, polydipsia, or blurred vision. He denies any hypoglycemic episodes. He does complain of dizziness at times. His blood pressure is low today at 90/60. He is currently on lisinopril mainly for renal protection due to his diabetes. This is something that may need to be discontinued as the patient does have episodes of dizziness. He also complains of 2 days of sinus pressure in his frontal and maxillary sinuses, headache, dizziness, and facial pain. He denies any myalgias or right upper quadrant pain. He denies any chest pain shortness of breath or dyspnea on exertion. Diabetic eye exam was performed yesterday and is normal.  At that time, my plan was: We will check a hemoglobin A1c regarding the patient's blood sugar. Goal hemoglobin A1c is less than 6.5. If it is less than 6, I will next discontinue Januvia.  Patient's blood pressures actually low today. I will check a urine microalbumin and if normal I will discontinue the patient's lisinopril. I recommended Sudafed for his sinusitis. Symptoms are no better by Friday I would call out amoxicillin. I will check a fasting lipid panel. Goal LDL cholesterol is less than 100. The remainder of his diabetic foot exam is normal. His diabetic eye exam is up-to-date and normal. He is compliant with his aspirin.  12/25/14 Patient is here today for follow-up. He continues to lose weight. As his weight has declined, says his blood pressure. His blood pressure  today is 98/60. He does report occasional dizziness and orthostatic dizziness. It is particularly worse when he has been working hard and  Has lost a lot of body fluid due to sweating. He was originally on lisinopril 10 mg a day for renal protection due to his diabetes. He denies any chest pain shortness of breath or dyspnea on exertion. He states that his blood  Sugars have been very well controlled. He denies any hypoglycemic episodes. He denies any polyuria, polydipsia, or blurred vision. He denies any myalgias or right upper quadrant pain. Diabetic foot exam today is normal. Diabetic eye exam is up-to-date.  At that time, my plan was: Patient's blood pressure is too low. I will check a microalbumin. If there is no microalbumin in the urine, I will have the patient discontinue lisinopril. If the patient continues to have microalbumin in the urine, I will decrease the Cipro from 10 mg a day to 5 mg a day to try to avoid hypotension. I will also check a fasting lipid panel. Goal LDL cholesterol is less than 100. I will also check a hemoglobin A1c. Goal hemoglobin A1c is less than 6.5. If his A1c is less than 6.5, I will stop the patient's Januvia due to cost.  07/12/15 Patient is here today for follow-up on his diabetes mellitus. Hemoglobin A1c is stable at 6.8. He denies any polyuria, polydipsia, or blurred vision. He denies any chest pain shortness of breath or dyspnea on exertion. He denies any myalgias or right upper quadrant pain.  Diabetic eye  exam was performed earlier this year and was normal. Diabetic foot exam is performed today and is significant only for onychomycosis. He does complain of some pains in the anterior portion of his right lower leg over his shins. 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 Symptoms sound similar to shin splints. He also reports some numbness in the top of his feet. He denies any claudication. Lab on 01/06/2016  Component Date  Value Ref Range Status  . Sodium 01/06/2016 138  135 - 146 mmol/L Final  . Potassium 01/06/2016 4.0  3.5 - 5.3 mmol/L Final  . Chloride 01/06/2016 100  98 - 110 mmol/L Final  . CO2 01/06/2016 25  20 - 31 mmol/L Final  . Glucose, Bld 01/06/2016 117* 70 - 99 mg/dL Final  . BUN 01/06/2016 14  7 - 25 mg/dL Final  . Creat 01/06/2016 0.97  0.70 - 1.33 mg/dL Final   Comment:   For patients > or = 55 years of age: The upper reference limit for Creatinine is approximately 13% higher for people identified as African-American.     . Total Bilirubin 01/06/2016 1.2  0.2 - 1.2 mg/dL Final  . Alkaline Phosphatase 01/06/2016 77  40 - 115 U/L Final  . AST 01/06/2016 23  10 - 35 U/L Final  . ALT 01/06/2016 29  9 - 46 U/L Final  . Total Protein 01/06/2016 7.3  6.1 - 8.1 g/dL Final  . Albumin 01/06/2016 4.6  3.6 - 5.1 g/dL Final  . Calcium 01/06/2016 9.5  8.6 - 10.3 mg/dL Final  . GFR, Est African American 01/06/2016 >89  >=60 mL/min Final  . GFR, Est Non African American 01/06/2016 88  >=60 mL/min Final  . TSH 01/06/2016 1.51  0.40 - 4.50 mIU/L Final  . Cholesterol 01/06/2016 161  125 - 200 mg/dL Final  . Triglycerides 01/06/2016 66  <150 mg/dL Final  . HDL 01/06/2016 49  >=40 mg/dL Final  . Total CHOL/HDL Ratio 01/06/2016 3.3  <=5.0 Ratio Final  . VLDL 01/06/2016 13  <30 mg/dL Final  . LDL Cholesterol 01/06/2016 99  <130 mg/dL Final   Comment:   Total Cholesterol/HDL Ratio:CHD Risk                        Coronary Heart Disease Risk Table                                        Men       Women          1/2 Average Risk              3.4        3.3              Average Risk              5.0        4.4           2X Average Risk              9.6        7.1           3X Average Risk             23.4       11.0 Use the calculated Patient Ratio above  and the CHD Risk table  to determine the patient's CHD Risk.   . WBC 01/06/2016 7.1  3.8 - 10.8 K/uL Final  . RBC 01/06/2016 4.96  4.20 - 5.80 MIL/uL  Final  . Hemoglobin 01/06/2016 15.5  13.0 - 17.0 g/dL Final  . HCT 01/06/2016 46.3  38.5 - 50.0 % Final  . MCV 01/06/2016 93.3  80.0 - 100.0 fL Final  . MCH 01/06/2016 31.3  27.0 - 33.0 pg Final  . MCHC 01/06/2016 33.5  32.0 - 36.0 g/dL Final  . RDW 01/06/2016 13.5  11.0 - 15.0 % Final  . Platelets 01/06/2016 157  140 - 400 K/uL Final  . MPV 01/06/2016 9.6  7.5 - 12.5 fL Final  . Neutro Abs 01/06/2016 3976  1,500 - 7,800 cells/uL Final  . Lymphs Abs 01/06/2016 2201  850 - 3,900 cells/uL Final  . Monocytes Absolute 01/06/2016 781  200 - 950 cells/uL Final  . Eosinophils Absolute 01/06/2016 142  15 - 500 cells/uL Final  . Basophils Absolute 01/06/2016 0  0 - 200 cells/uL Final  . Neutrophils Relative % 01/06/2016 56  % Final  . Monocytes Relative 01/06/2016 11  % Final  . Eosinophils Relative 01/06/2016 2  % Final  . Basophils Relative 01/06/2016 0  % Final  . Smear Review 01/06/2016 Criteria for review not met   Final  . Hgb A1c MFr Bld 01/07/2016 6.8* <5.7 % Final   Comment:   For someone without known diabetes, a hemoglobin A1c value of 6.5% or greater indicates that they may have diabetes and this should be confirmed with a follow-up test.   For someone with known diabetes, a value <7% indicates that their diabetes is well controlled and a value greater than or equal to 7% indicates suboptimal control. A1c targets should be individualized based on duration of diabetes, age, comorbid conditions, and other considerations.   Currently, no consensus exists for use of hemoglobin A1c for diagnosis of diabetes for children.     . Mean Plasma Glucose 01/07/2016 148  mg/dL Final    Past Medical History:  Diagnosis Date  . Allergy    allergic rhinitis  . Diabetes mellitus   . Elevated LFTs   . Hyperlipidemia   . Hypertension   . Obesity    No past surgical history on file. Current Outpatient Prescriptions on File Prior to Visit  Medication Sig Dispense Refill  . aspirin 81  MG tablet Take 81 mg by mouth at bedtime.     . canagliflozin (INVOKANA) 300 MG TABS tablet Take 1 tablet (300 mg total) by mouth daily before breakfast. 30 tablet 0  . cholecalciferol (VITAMIN D) 1000 UNITS tablet Take 1,000 Units by mouth at bedtime.      Marland Kitchen Cod Liver Oil 1000 MG CAPS Take 1 capsule by mouth daily.    . fish oil-omega-3 fatty acids 1000 MG capsule Take 1 g by mouth 2 (two) times daily. 1 in am, 2 in pm    . glucose blood test strip Check BS bid and prn  - Pt uses one touch ultra meter 100 each 12  . JANUVIA 100 MG tablet TAKE 1 TABLET BY MOUTH EVERY EVENING 30 tablet 5  . lisinopril (PRINIVIL,ZESTRIL) 10 MG tablet TAKE ONE TABLET BY MOUTH DAILY 90 tablet 0  . meloxicam (MOBIC) 15 MG tablet Take 1 tablet (15 mg total) by mouth daily. 30 tablet 0  . metFORMIN (GLUCOPHAGE) 1000 MG tablet TAKE ONE TABLET BY MOUTH TWICE DAILY  180 tablet 1  . Multiple Vitamins-Minerals (MULTIVITAMIN PO) Take 1 tablet by mouth daily.    . pravastatin (PRAVACHOL) 20 MG tablet TAKE 1 TABLET (20 MG TOTAL) BY MOUTH AT BEDTIME. 90 tablet 1  . pyridOXINE (VITAMIN B-6) 100 MG tablet Take 100 mg by mouth daily.      . vitamin C (ASCORBIC ACID) 500 MG tablet Take 500 mg by mouth at bedtime.       No current facility-administered medications on file prior to visit.    No Known Allergies Social History   Social History  . Marital status: Married    Spouse name: N/A  . Number of children: N/A  . Years of education: N/A   Occupational History  . Not on file.   Social History Main Topics  . Smoking status: Never Smoker  . Smokeless tobacco: Former Systems developer    Types: Chew  . Alcohol use No  . Drug use: No     Comment: Used "cocaine" as young adult  . Sexual activity: Not on file   Other Topics Concern  . Not on file   Social History Narrative  . No narrative on file   Family History  Problem Relation Age of Onset  . Hyperlipidemia Father   . Hypertension Father   . Heart disease Father   .  Alcohol abuse Father       Review of Systems  All other systems reviewed and are negative.      Objective:   Physical Exam  Constitutional: He is oriented to person, place, and time. He appears well-developed and well-nourished. No distress.  HENT:  Head: Normocephalic and atraumatic.  Right Ear: External ear normal.  Left Ear: External ear normal.  Nose: Nose normal.  Mouth/Throat: Oropharynx is clear and moist.  Eyes: Conjunctivae and EOM are normal. Pupils are equal, round, and reactive to light. Right eye exhibits no discharge. Left eye exhibits no discharge. No scleral icterus.  Neck: Normal range of motion. Neck supple. No JVD present. No tracheal deviation present. No thyromegaly present.  Cardiovascular: Normal rate, regular rhythm, normal heart sounds and intact distal pulses.  Exam reveals no gallop and no friction rub.   No murmur heard. Pulmonary/Chest: Effort normal and breath sounds normal. No stridor. No respiratory distress. He has no wheezes. He has no rales.  Abdominal: Soft. Bowel sounds are normal. He exhibits no distension and no mass. There is no tenderness. There is no rebound and no guarding.  Genitourinary: Rectum normal and prostate normal. Rectal exam shows guaiac negative stool. No penile tenderness.  Musculoskeletal: Normal range of motion. He exhibits no edema or tenderness.  Lymphadenopathy:    He has no cervical adenopathy.  Neurological: He is alert and oriented to person, place, and time. He has normal reflexes. No cranial nerve deficit. He exhibits normal muscle tone. Coordination normal.  Skin: Skin is warm and dry. No rash noted. He is not diaphoretic. No erythema. No pallor.  Psychiatric: He has a normal mood and affect. His behavior is normal. Judgment and thought content normal.  Vitals reviewed.         Assessment & Plan:  Controlled type 2 diabetes mellitus without complication, without long-term current use of insulin  (HCC)  Essential hypertension  HLD (hyperlipidemia) The patient's blood pressures controlled. His cholesterol is controlled. LDL is below his goal of 100. Hemoglobin A1c is acceptable at 6.8. Diabetic eye exam and foot exam are up-to-date. I recommended the patient try orthotics for his  shoes to see if this will help some with his leg pain after being on his feet working for 8 hours. Otherwise recheck in 6 months

## 2016-01-21 ENCOUNTER — Telehealth: Payer: Self-pay | Admitting: Family Medicine

## 2016-01-21 NOTE — Telephone Encounter (Signed)
Mr. Kyle Ray called asking if we have anymore $5 coupons for Januvia. His expired per his pharmacist. Please give him a call regarding this.  Pt's ph# [phone] Thank you.

## 2016-01-22 ENCOUNTER — Other Ambulatory Visit: Payer: Self-pay | Admitting: Family Medicine

## 2016-01-22 NOTE — Telephone Encounter (Signed)
Patient seen in office and given new card.

## 2016-02-11 ENCOUNTER — Telehealth: Payer: Self-pay | Admitting: *Deleted

## 2016-02-11 NOTE — Telephone Encounter (Signed)
Patient called and states he needs samples of the Invokana. Patient states if we have samples he will pick them up on Friday. Please advise. Thank you .

## 2016-02-14 NOTE — Telephone Encounter (Signed)
Samples given to pt 

## 2016-02-17 ENCOUNTER — Other Ambulatory Visit: Payer: Self-pay | Admitting: Family Medicine

## 2016-03-03 ENCOUNTER — Telehealth: Payer: Self-pay | Admitting: Physician Assistant

## 2016-03-03 NOTE — Telephone Encounter (Signed)
Patient is calling to get invocana samples  [phone]

## 2016-03-03 NOTE — Telephone Encounter (Signed)
Samples left up front and pt aware 

## 2016-03-12 ENCOUNTER — Ambulatory Visit (INDEPENDENT_AMBULATORY_CARE_PROVIDER_SITE_OTHER): Payer: Managed Care, Other (non HMO) | Admitting: Family Medicine

## 2016-03-12 ENCOUNTER — Encounter: Payer: Self-pay | Admitting: Family Medicine

## 2016-03-12 ENCOUNTER — Encounter: Payer: Self-pay | Admitting: Physician Assistant

## 2016-03-12 VITALS — BP 142/64 | HR 92 | Temp 98.8°F | Resp 18 | Ht 67.0 in | Wt 208.0 lb

## 2016-03-12 DIAGNOSIS — J020 Streptococcal pharyngitis: Secondary | ICD-10-CM

## 2016-03-12 LAB — STREP GROUP A AG, W/REFLEX TO CULT: STREGTOCOCCUS GROUP A AG SCREEN: DETECTED — AB

## 2016-03-12 MED ORDER — AMOXICILLIN 875 MG PO TABS: 875.0000 mg | ORAL_TABLET | Freq: Two times a day (BID) | ORAL | 0 refills | Status: DC

## 2016-03-12 NOTE — Progress Notes (Signed)
Subjective:    Patient ID: Kyle Ray, male    DOB: 1961/01/31, 55 y.o.   MRN: [id number]  Medication Refill   Patient has had a sever sore throat for 3 days.  On exam, his left tonsil is swollen erythematous and covered with white exudate. He has tender lymphadenopathy on the left anterior cervical chain. He reports subjective fevers. Past Medical History:  Diagnosis Date  . Allergy    allergic rhinitis  . Diabetes mellitus   . Elevated LFTs   . Hyperlipidemia   . Hypertension   . Obesity    No past surgical history on file. Current Outpatient Prescriptions on File Prior to Visit  Medication Sig Dispense Refill  . aspirin 81 MG tablet Take 81 mg by mouth at bedtime.     . canagliflozin (INVOKANA) 300 MG TABS tablet Take 1 tablet (300 mg total) by mouth daily before breakfast. 30 tablet 0  . cholecalciferol (VITAMIN D) 1000 UNITS tablet Take 1,000 Units by mouth at bedtime.      Marland Kitchen. Cod Liver Oil 1000 MG CAPS Take 1 capsule by mouth daily.    . fish oil-omega-3 fatty acids 1000 MG capsule Take 1 g by mouth 2 (two) times daily. 1 in am, 2 in pm    . glucose blood test strip Check BS bid and prn  - Pt uses one touch ultra meter 100 each 12  . JANUVIA 100 MG tablet TAKE 1 TABLET BY MOUTH EVERY EVENING 30 tablet 5  . lisinopril (PRINIVIL,ZESTRIL) 10 MG tablet TAKE ONE TABLET BY MOUTH DAILY 90 tablet 0  . meloxicam (MOBIC) 15 MG tablet Take 1 tablet (15 mg total) by mouth daily. 30 tablet 0  . metFORMIN (GLUCOPHAGE) 1000 MG tablet TAKE ONE TABLET BY MOUTH TWICE DAILY 180 tablet 1  . Multiple Vitamins-Minerals (MULTIVITAMIN PO) Take 1 tablet by mouth daily.    . pravastatin (PRAVACHOL) 20 MG tablet TAKE 1 TABLET (20 MG TOTAL) BY MOUTH AT BEDTIME. 90 tablet 1  . pyridOXINE (VITAMIN B-6) 100 MG tablet Take 100 mg by mouth daily.      . vitamin C (ASCORBIC ACID) 500 MG tablet Take 500 mg by mouth at bedtime.       No current facility-administered medications on file prior to visit.     No Known Allergies Social History   Social History  . Marital status: Married    Spouse name: N/A  . Number of children: N/A  . Years of education: N/A   Occupational History  . Not on file.   Social History Main Topics  . Smoking status: Never Smoker  . Smokeless tobacco: Former NeurosurgeonUser    Types: Chew  . Alcohol use No  . Drug use: No     Comment: Used "cocaine" as young adult  . Sexual activity: Not on file   Other Topics Concern  . Not on file   Social History Narrative  . No narrative on file   Family History  Problem Relation Age of Onset  . Hyperlipidemia Father   . Hypertension Father   . Heart disease Father   . Alcohol abuse Father       Review of Systems  All other systems reviewed and are negative.      Objective:   Physical Exam  Constitutional: He is oriented to person, place, and time. He appears well-developed and well-nourished. No distress.  HENT:  Head: Normocephalic and atraumatic.  Right Ear: External ear normal.  Left  Ear: External ear normal.  Nose: Nose normal.  Mouth/Throat: Oropharyngeal exudate, posterior oropharyngeal edema and posterior oropharyngeal erythema present.  Eyes: Conjunctivae and EOM are normal. Pupils are equal, round, and reactive to light. Right eye exhibits no discharge. Left eye exhibits no discharge. No scleral icterus.  Neck: Normal range of motion. Neck supple. No JVD present. No tracheal deviation present. No thyromegaly present.  Cardiovascular: Normal rate, regular rhythm, normal heart sounds and intact distal pulses.  Exam reveals no gallop and no friction rub.   No murmur heard. Pulmonary/Chest: Effort normal and breath sounds normal. No stridor. No respiratory distress. He has no wheezes. He has no rales.  Abdominal: Soft. Bowel sounds are normal. He exhibits no distension and no mass. There is no tenderness. There is no rebound and no guarding.  Genitourinary: Rectum normal and prostate normal. Rectal  exam shows guaiac negative stool. No penile tenderness.  Musculoskeletal: Normal range of motion. He exhibits no edema or tenderness.  Lymphadenopathy:    He has no cervical adenopathy.  Neurological: He is alert and oriented to person, place, and time. He has normal reflexes. No cranial nerve deficit. He exhibits normal muscle tone. Coordination normal.  Skin: Skin is warm and dry. No rash noted. He is not diaphoretic. No erythema. No pallor.  Psychiatric: He has a normal mood and affect. His behavior is normal. Judgment and thought content normal.  Vitals reviewed.         Assessment & Plan:  Streptococcal sore throat  Amoxicillin 875 mg by mouth twice a day for 10 days. Out of work for 48 hours.

## 2016-04-27 ENCOUNTER — Telehealth: Payer: Self-pay | Admitting: Physician Assistant

## 2016-04-27 NOTE — Telephone Encounter (Signed)
Patient requesting samples of invokana 300 mg  CB# [phone]

## 2016-04-29 NOTE — Telephone Encounter (Signed)
Pt aware to pick - up samples and samples left up front

## 2016-05-21 ENCOUNTER — Telehealth: Payer: Self-pay | Admitting: Physician Assistant

## 2016-05-21 NOTE — Telephone Encounter (Signed)
Patient calling for samples of invocana  [phone]

## 2016-05-26 NOTE — Telephone Encounter (Signed)
Samples left up front and pt aware to p/u 

## 2016-06-16 ENCOUNTER — Telehealth: Payer: Self-pay | Admitting: Family Medicine

## 2016-06-16 NOTE — Telephone Encounter (Signed)
Pt called to get samples for invokana. [phone].

## 2016-06-19 NOTE — Telephone Encounter (Signed)
Samples given to pt 

## 2016-07-08 ENCOUNTER — Other Ambulatory Visit: Payer: Managed Care, Other (non HMO)

## 2016-07-08 ENCOUNTER — Other Ambulatory Visit: Payer: Self-pay | Admitting: Family Medicine

## 2016-07-08 DIAGNOSIS — I10 Essential (primary) hypertension: Secondary | ICD-10-CM

## 2016-07-08 DIAGNOSIS — Z79899 Other long term (current) drug therapy: Secondary | ICD-10-CM

## 2016-07-08 DIAGNOSIS — E1165 Type 2 diabetes mellitus with hyperglycemia: Secondary | ICD-10-CM

## 2016-07-08 DIAGNOSIS — E785 Hyperlipidemia, unspecified: Secondary | ICD-10-CM

## 2016-07-08 LAB — COMPLETE METABOLIC PANEL WITH GFR
ALT: 25 U/L (ref 9–46)
AST: 21 U/L (ref 10–35)
Albumin: 4.5 g/dL (ref 3.6–5.1)
Alkaline Phosphatase: 76 U/L (ref 40–115)
BUN: 15 mg/dL (ref 7–25)
CALCIUM: 9.3 mg/dL (ref 8.6–10.3)
CHLORIDE: 103 mmol/L (ref 98–110)
CO2: 27 mmol/L (ref 20–31)
Creat: 0.9 mg/dL (ref 0.70–1.33)
Glucose, Bld: 113 mg/dL — ABNORMAL HIGH (ref 70–99)
POTASSIUM: 4 mmol/L (ref 3.5–5.3)
Sodium: 141 mmol/L (ref 135–146)
Total Bilirubin: 0.9 mg/dL (ref 0.2–1.2)
Total Protein: 6.9 g/dL (ref 6.1–8.1)

## 2016-07-08 LAB — LIPID PANEL
CHOL/HDL RATIO: 3 ratio (ref <5.0)
CHOLESTEROL: 155 mg/dL (ref <200)
HDL: 51 mg/dL (ref >40)
LDL Cholesterol: 90 mg/dL (ref <100)
TRIGLYCERIDES: 68 mg/dL (ref <150)
VLDL: 14 mg/dL (ref <30)

## 2016-07-09 LAB — MICROALBUMIN / CREATININE URINE RATIO
CREATININE, URINE: 85 mg/dL (ref 20–370)
MICROALB UR: 1.7 mg/dL
MICROALB/CREAT RATIO: 20 ug/mg{creat} (ref <30)

## 2016-07-09 LAB — HEMOGLOBIN A1C
Hgb A1c MFr Bld: 6.3 % — ABNORMAL HIGH (ref <5.7)
Mean Plasma Glucose: 134 mg/dL

## 2016-07-14 ENCOUNTER — Ambulatory Visit (INDEPENDENT_AMBULATORY_CARE_PROVIDER_SITE_OTHER): Payer: Managed Care, Other (non HMO) | Admitting: Family Medicine

## 2016-07-14 VITALS — BP 164/80 | HR 76 | Temp 97.8°F | Resp 16 | Ht 67.0 in | Wt 205.0 lb

## 2016-07-14 DIAGNOSIS — I10 Essential (primary) hypertension: Secondary | ICD-10-CM

## 2016-07-14 DIAGNOSIS — E119 Type 2 diabetes mellitus without complications: Secondary | ICD-10-CM

## 2016-07-14 MED ORDER — LISINOPRIL 10 MG PO TABS: 10.0000 mg | ORAL_TABLET | Freq: Every day | ORAL | 3 refills | Status: DC

## 2016-07-14 NOTE — Progress Notes (Signed)
Subjective:    Patient ID: Kyle Ray, male    DOB: 06/27/1960, 56 y.o.   MRN: [id number]  Medication Refill    Patient is here today for follow-up on his diabetes mellitus. Hemoglobin A1c is excellent at 6.3. He denies any polyuria, polydipsia, or blurred vision. He denies any chest pain shortness of breath or dyspnea on exertion. He denies any myalgias or right upper quadrant pain.   Diabetic foot exam is performed today and is significant only for onychomycosis. He does complain of some pains in the anterior portion of his right lower leg over his shins. 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 Symptoms sound similar to shin splints. He also reports some numbness in the top of his feet. He denies any claudication. Appointment on 07/08/2016  Component Date Value Ref Range Status  . Sodium 07/08/2016 141  135 - 146 mmol/L Final  . Potassium 07/08/2016 4.0  3.5 - 5.3 mmol/L Final  . Chloride 07/08/2016 103  98 - 110 mmol/L Final  . CO2 07/08/2016 27  20 - 31 mmol/L Final  . Glucose, Bld 07/08/2016 113* 70 - 99 mg/dL Final  . BUN 40/98/1191 15  7 - 25 mg/dL Final  . Creat 47/82/9562 0.90  0.70 - 1.33 mg/dL Final   Comment:   For patients > or = 56 years of age: The upper reference limit for Creatinine is approximately 13% higher for people identified as African-American.     . Total Bilirubin 07/08/2016 0.9  0.2 - 1.2 mg/dL Final  . Alkaline Phosphatase 07/08/2016 76  40 - 115 U/L Final  . AST 07/08/2016 21  10 - 35 U/L Final  . ALT 07/08/2016 25  9 - 46 U/L Final  . Total Protein 07/08/2016 6.9  6.1 - 8.1 g/dL Final  . Albumin 13/12/6576 4.5  3.6 - 5.1 g/dL Final  . Calcium 46/96/2952 9.3  8.6 - 10.3 mg/dL Final  . GFR, Est African American 07/08/2016 >89  >=60 mL/min Final  . GFR, Est Non African American 07/08/2016 >89  >=60 mL/min Final  . Cholesterol 07/08/2016 155  <200 mg/dL Final  . Triglycerides 07/08/2016 68  <150 mg/dL Final   . HDL 84/13/2440 51  >40 mg/dL Final  . Total CHOL/HDL Ratio 07/08/2016 3.0  <1.0 Ratio Final  . VLDL 07/08/2016 14  <30 mg/dL Final  . LDL Cholesterol 07/08/2016 90  <100 mg/dL Final  . Hgb U7O MFr Bld 07/08/2016 6.3* <5.7 % Final   Comment:   For someone without known diabetes, a hemoglobin A1c value between 5.7% and 6.4% is consistent with prediabetes and should be confirmed with a follow-up test.   For someone with known diabetes, a value <7% indicates that their diabetes is well controlled. A1c targets should be individualized based on duration of diabetes, age, co-morbid conditions and other considerations.   This assay result is consistent with an increased risk of diabetes.   Currently, no consensus exists regarding use of hemoglobin A1c for diagnosis of diabetes in children.     . Mean Plasma Glucose 07/08/2016 134  mg/dL Final  . Creatinine, Urine 07/08/2016 85  20 - 370 mg/dL Final  . Microalb, Ur 53/66/4403 1.7  Not estab mg/dL Final  . Microalb Creat Ratio 07/08/2016 20  <30 mcg/mg creat Final   Comment: The ADA has defined abnormalities in albumin excretion as follows:           Category  Result                            (mcg/mg creatinine)                 Normal:    <30       Microalbuminuria:    30 - 299   Clinical albuminuria:    > or = 300   The ADA recommends that at least two of three specimens collected within a 3 - 6 month period be abnormal before considering a patient to be within a diagnostic category.       Past Medical History:  Diagnosis Date  . Allergy    allergic rhinitis  . Diabetes mellitus   . Elevated LFTs   . Hyperlipidemia   . Hypertension   . Obesity    No past surgical history on file. Current Outpatient Prescriptions on File Prior to Visit  Medication Sig Dispense Refill  . aspirin 81 MG tablet Take 81 mg by mouth at bedtime.     . canagliflozin (INVOKANA) 300 MG TABS tablet Take 1 tablet (300 mg total) by mouth  daily before breakfast. 30 tablet 0  . cholecalciferol (VITAMIN D) 1000 UNITS tablet Take 1,000 Units by mouth at bedtime.      Marland Kitchen Cod Liver Oil 1000 MG CAPS Take 1 capsule by mouth daily.    . fish oil-omega-3 fatty acids 1000 MG capsule Take 1 g by mouth 2 (two) times daily. 1 in am, 2 in pm    . glucose blood test strip Check BS bid and prn  - Pt uses one touch ultra meter 100 each 12  . JANUVIA 100 MG tablet TAKE 1 TABLET BY MOUTH EVERY EVENING 30 tablet 5  . meloxicam (MOBIC) 15 MG tablet Take 1 tablet (15 mg total) by mouth daily. 30 tablet 0  . metFORMIN (GLUCOPHAGE) 1000 MG tablet TAKE ONE TABLET BY MOUTH TWICE DAILY 180 tablet 1  . Multiple Vitamins-Minerals (MULTIVITAMIN PO) Take 1 tablet by mouth daily.    Marland Kitchen pyridOXINE (VITAMIN B-6) 100 MG tablet Take 100 mg by mouth daily.      . vitamin C (ASCORBIC ACID) 500 MG tablet Take 500 mg by mouth at bedtime.      . pravastatin (PRAVACHOL) 20 MG tablet TAKE 1 TABLET (20 MG TOTAL) BY MOUTH AT BEDTIME. (Patient not taking: Reported on 07/14/2016) 90 tablet 1   No current facility-administered medications on file prior to visit.    No Known Allergies Social History   Social History  . Marital status: Married    Spouse name: N/A  . Number of children: N/A  . Years of education: N/A   Occupational History  . Not on file.   Social History Main Topics  . Smoking status: Never Smoker  . Smokeless tobacco: Former Neurosurgeon    Types: Chew  . Alcohol use No  . Drug use: No     Comment: Used "cocaine" as young adult  . Sexual activity: Not on file   Other Topics Concern  . Not on file   Social History Narrative  . No narrative on file   Family History  Problem Relation Age of Onset  . Hyperlipidemia Father   . Hypertension Father   . Heart disease Father   . Alcohol abuse Father       Review of Systems  All other systems reviewed and are negative.  Objective:   Physical Exam  Constitutional: He is oriented to person,  place, and time. He appears well-developed and well-nourished. No distress.  HENT:  Head: Normocephalic and atraumatic.  Right Ear: External ear normal.  Left Ear: External ear normal.  Nose: Nose normal.  Mouth/Throat: Oropharynx is clear and moist.  Eyes: Conjunctivae and EOM are normal. Pupils are equal, round, and reactive to light. Right eye exhibits no discharge. Left eye exhibits no discharge. No scleral icterus.  Neck: Normal range of motion. Neck supple. No JVD present. No tracheal deviation present. No thyromegaly present.  Cardiovascular: Normal rate, regular rhythm, normal heart sounds and intact distal pulses.  Exam reveals no gallop and no friction rub.   No murmur heard. Pulmonary/Chest: Effort normal and breath sounds normal. No stridor. No respiratory distress. He has no wheezes. He has no rales.  Abdominal: Soft. Bowel sounds are normal. He exhibits no distension and no mass. There is no tenderness. There is no rebound and no guarding.  Genitourinary: Rectum normal and prostate normal. Rectal exam shows guaiac negative stool. No penile tenderness.  Musculoskeletal: Normal range of motion. He exhibits no edema or tenderness.  Lymphadenopathy:    He has no cervical adenopathy.  Neurological: He is alert and oriented to person, place, and time. He has normal reflexes. No cranial nerve deficit. He exhibits normal muscle tone. Coordination normal.  Skin: Skin is warm and dry. No rash noted. He is not diaphoretic. No erythema. No pallor.  Psychiatric: He has a normal mood and affect. His behavior is normal. Judgment and thought content normal.  Vitals reviewed.         Assessment & Plan:  Essential hypertension, diabetes mellitus type 2 controlled,  Diabetes is well controlled. I will make no changes in his medication at this time. I am however concerned about his elevated blood pressure. I will have the patient resume lisinopril 10 mg a day and recheck blood pressure in one  month. Cholesterol is outstanding even of the patient's not taking statin medication. Diabetic foot exam is performed today and is normal. I believe the pains in his lower legs are likely shin splints secondary to his job.

## 2016-07-22 ENCOUNTER — Other Ambulatory Visit: Payer: Self-pay | Admitting: Physician Assistant

## 2016-08-09 ENCOUNTER — Other Ambulatory Visit: Payer: Self-pay | Admitting: Family Medicine

## 2016-08-10 ENCOUNTER — Telehealth: Payer: Self-pay | Admitting: Family Medicine

## 2016-08-10 NOTE — Telephone Encounter (Signed)
Pt wants to know if we have samples of invokana.

## 2016-08-10 NOTE — Telephone Encounter (Signed)
Medication refilled per protocol. 

## 2016-08-12 NOTE — Telephone Encounter (Signed)
Sample left up front and pt aware to pick up

## 2016-09-03 ENCOUNTER — Telehealth: Payer: Self-pay | Admitting: Physician Assistant

## 2016-09-03 NOTE — Telephone Encounter (Signed)
Pt wanting invokana samples. Please call back.

## 2016-09-04 NOTE — Telephone Encounter (Signed)
Pt aware - samples up front  

## 2016-10-05 ENCOUNTER — Other Ambulatory Visit: Payer: Self-pay | Admitting: Family Medicine

## 2016-10-05 MED ORDER — LISINOPRIL 10 MG PO TABS: 10.0000 mg | ORAL_TABLET | Freq: Every day | ORAL | 3 refills | Status: DC

## 2016-12-04 ENCOUNTER — Telehealth: Payer: Self-pay | Admitting: Physician Assistant

## 2016-12-04 NOTE — Telephone Encounter (Signed)
Patient would like samples for his [place]

## 2016-12-04 NOTE — Telephone Encounter (Signed)
Spoke with patient and informed him we did not have samples of the RX he needed.

## 2016-12-09 ENCOUNTER — Other Ambulatory Visit: Payer: Self-pay | Admitting: Physician Assistant

## 2016-12-09 NOTE — Telephone Encounter (Signed)
I spoke with patient insurance company [facility] Ray and was informed that Kyle Ray would require a PA and that if approved patient would pay$50. Patient states that is too much. I offered to get patient assistance with the cost of his medication he stated he made to much.  Kyle Ray with Kyle Ray informed me the other alt Rx would be Jardiance 25mg . Since patient felt he made to much to get assistance and is currently getting samples. I explained to patient it would probably be best to make an appt with PCP  And see what other options would work for him. Patient agreed and made an appt to be seen on 12/14/2016

## 2016-12-09 NOTE — Telephone Encounter (Signed)
Patient was asking for samples of 300 mg invokana, however we do not have any, would like to know if there is something else he can take that is not so expensive other than the incokana  [phone]

## 2016-12-14 ENCOUNTER — Encounter: Payer: Self-pay | Admitting: Family Medicine

## 2016-12-14 ENCOUNTER — Ambulatory Visit (INDEPENDENT_AMBULATORY_CARE_PROVIDER_SITE_OTHER): Payer: Managed Care, Other (non HMO) | Admitting: Family Medicine

## 2016-12-14 VITALS — BP 100/60 | HR 72 | Temp 98.0°F | Resp 18 | Ht 67.0 in | Wt 204.0 lb

## 2016-12-14 DIAGNOSIS — E119 Type 2 diabetes mellitus without complications: Secondary | ICD-10-CM

## 2016-12-14 LAB — BASIC METABOLIC PANEL WITH GFR
BUN: 16 mg/dL (ref 7–25)
CHLORIDE: 102 mmol/L (ref 98–110)
CO2: 23 mmol/L (ref 20–31)
Calcium: 9.6 mg/dL (ref 8.6–10.3)
Creat: 0.92 mg/dL (ref 0.70–1.33)
GFR, Est African American: >89 mL/min (ref >=60)
GFR, Est Non African American: >89 mL/min (ref >=60)
GLUCOSE: 151 mg/dL — AB (ref 70–99)
POTASSIUM: 4.2 mmol/L (ref 3.5–5.3)
Sodium: 137 mmol/L (ref 135–146)

## 2016-12-14 MED ORDER — PIOGLITAZONE HCL 30 MG PO TABS: 30.0000 mg | ORAL_TABLET | Freq: Every day | ORAL | 5 refills | Status: DC

## 2016-12-14 NOTE — Progress Notes (Signed)
Subjective:    Patient ID: Kyle Ray, male    DOB: 08-Jun-1960, 56 y.o.   MRN: [id number]  Medication Refill    Patient is here today for follow-up on his diabetes mellitus.His blood sugars have been under excellent control ever since the addition of invokana.  We have been providing him samples of this medication. However we have no further samples to provide and therefore you will have to buy the medication. Unfortunately with his insurance, he is unable to afford this. He is here today to discuss generic options. Generic options include glipizide which previously gave him hypoglycemic episodes or Actos which he has never taken to his knowledge  Past Medical History:  Diagnosis Date  . Allergy    allergic rhinitis  . Diabetes mellitus   . Elevated LFTs   . Hyperlipidemia   . Hypertension   . Obesity    No past surgical history on file. Current Outpatient Prescriptions on File Prior to Visit  Medication Sig Dispense Refill  . aspirin 81 MG tablet Take 81 mg by mouth at bedtime.     . canagliflozin (INVOKANA) 300 MG TABS tablet Take 1 tablet (300 mg total) by mouth daily before breakfast. 30 tablet 0  . cholecalciferol (VITAMIN D) 1000 UNITS tablet Take 1,000 Units by mouth at bedtime.      Marland Kitchen Cod Liver Oil 1000 MG CAPS Take 1 capsule by mouth daily.    . fish oil-omega-3 fatty acids 1000 MG capsule Take 1 g by mouth 2 (two) times daily. 1 in am, 2 in pm    . glucose blood test strip Check BS bid and prn  - Pt uses one touch ultra meter 100 each 12  . JANUVIA 100 MG tablet TAKE 1 TABLET BY MOUTH EVERY EVENING 30 tablet 5  . lisinopril (PRINIVIL,ZESTRIL) 10 MG tablet Take 1 tablet (10 mg total) by mouth daily. 90 tablet 3  . meloxicam (MOBIC) 15 MG tablet Take 1 tablet (15 mg total) by mouth daily. 30 tablet 0  . metFORMIN (GLUCOPHAGE) 1000 MG tablet TAKE ONE TABLET BY MOUTH TWICE DAILY 180 tablet 1  . Multiple Vitamins-Minerals (MULTIVITAMIN PO) Take 1 tablet by mouth daily.      Marland Kitchen pyridOXINE (VITAMIN B-6) 100 MG tablet Take 100 mg by mouth daily.      . vitamin C (ASCORBIC ACID) 500 MG tablet Take 500 mg by mouth at bedtime.       No current facility-administered medications on file prior to visit.    No Known Allergies Social History   Social History  . Marital status: Married    Spouse name: N/A  . Number of children: N/A  . Years of education: N/A   Occupational History  . Not on file.   Social History Main Topics  . Smoking status: Never Smoker  . Smokeless tobacco: Former Neurosurgeon    Types: Chew  . Alcohol use No  . Drug use: No     Comment: Used "cocaine" as young adult  . Sexual activity: Not on file   Other Topics Concern  . Not on file   Social History Narrative  . No narrative on file   Family History  Problem Relation Age of Onset  . Hyperlipidemia Father   . Hypertension Father   . Heart disease Father   . Alcohol abuse Father       Review of Systems  All other systems reviewed and are negative.      Objective:  Physical Exam  Constitutional: He is oriented to person, place, and time. He appears well-developed and well-nourished. No distress.  HENT:  Head: Normocephalic and atraumatic.  Right Ear: External ear normal.  Left Ear: External ear normal.  Nose: Nose normal.  Mouth/Throat: Oropharynx is clear and moist.  Eyes: Pupils are equal, round, and reactive to light. Conjunctivae and EOM are normal. Right eye exhibits no discharge. Left eye exhibits no discharge. No scleral icterus.  Neck: Normal range of motion. Neck supple. No JVD present. No tracheal deviation present. No thyromegaly present.  Cardiovascular: Normal rate, regular rhythm, normal heart sounds and intact distal pulses.  Exam reveals no gallop and no friction rub.   No murmur heard. Pulmonary/Chest: Effort normal and breath sounds normal. No stridor. No respiratory distress. He has no wheezes. He has no rales.  Abdominal: Soft. Bowel sounds are normal.  He exhibits no distension and no mass. There is no tenderness. There is no rebound and no guarding.  Genitourinary: Rectum normal and prostate normal. Rectal exam shows guaiac negative stool. No penile tenderness.  Musculoskeletal: Normal range of motion. He exhibits no edema or tenderness.  Lymphadenopathy:    He has no cervical adenopathy.  Neurological: He is alert and oriented to person, place, and time. He has normal reflexes. No cranial nerve deficit. He exhibits normal muscle tone. Coordination normal.  Skin: Skin is warm and dry. No rash noted. He is not diaphoretic. No erythema. No pallor.  Psychiatric: He has a normal mood and affect. His behavior is normal. Judgment and thought content normal.  Vitals reviewed.         Assessment & Plan:  Type 2 diabetes mellitus without complication, without long-term current use of insulin (HCC) - Plan: BASIC METABOLIC PANEL WITH GFR, Hemoglobin A1c  I will confirm adequate control of his diabetes prior to medication change but checking a hemoglobin A1c today. If his hemoglobin A1c is under good control (less than 6.5), I will switch invokana to Actos 30 mg a day and make no further changes in his medication and just recheck his labs in 6 months

## 2016-12-15 LAB — HEMOGLOBIN A1C
Hgb A1c MFr Bld: 6.8 % — ABNORMAL HIGH (ref <5.7)
Mean Plasma Glucose: 148 mg/dL

## 2017-01-17 ENCOUNTER — Other Ambulatory Visit: Payer: Self-pay | Admitting: Family Medicine

## 2017-01-29 ENCOUNTER — Other Ambulatory Visit: Payer: Self-pay | Admitting: Family Medicine

## 2017-02-18 ENCOUNTER — Other Ambulatory Visit: Payer: Self-pay | Admitting: Family Medicine

## 2017-02-18 MED ORDER — LOSARTAN POTASSIUM 50 MG PO TABS: 50.0000 mg | ORAL_TABLET | Freq: Every day | ORAL | 3 refills | Status: DC

## 2017-06-02 ENCOUNTER — Other Ambulatory Visit: Payer: Self-pay | Admitting: Family Medicine

## 2017-06-02 MED ORDER — SITAGLIPTIN PHOSPHATE 100 MG PO TABS: 100.0000 mg | ORAL_TABLET | Freq: Every evening | ORAL | 3 refills | Status: DC

## 2017-06-09 ENCOUNTER — Other Ambulatory Visit: Payer: Managed Care, Other (non HMO)

## 2017-06-09 DIAGNOSIS — R7989 Other specified abnormal findings of blood chemistry: Secondary | ICD-10-CM

## 2017-06-09 DIAGNOSIS — Z79899 Other long term (current) drug therapy: Secondary | ICD-10-CM

## 2017-06-09 DIAGNOSIS — E119 Type 2 diabetes mellitus without complications: Secondary | ICD-10-CM

## 2017-06-09 DIAGNOSIS — R945 Abnormal results of liver function studies: Principal | ICD-10-CM

## 2017-06-10 LAB — COMPREHENSIVE METABOLIC PANEL
AG RATIO: 2 (calc) (ref 1.0–2.5)
ALBUMIN MSPROF: 4.5 g/dL (ref 3.6–5.1)
ALKALINE PHOSPHATASE (APISO): 60 U/L (ref 40–115)
ALT: 37 U/L (ref 9–46)
AST: 29 U/L (ref 10–35)
BILIRUBIN TOTAL: 0.7 mg/dL (ref 0.2–1.2)
BUN: 13 mg/dL (ref 7–25)
CALCIUM: 9.6 mg/dL (ref 8.6–10.3)
CO2: 28 mmol/L (ref 20–32)
Chloride: 102 mmol/L (ref 98–110)
Creat: 1.06 mg/dL (ref 0.70–1.33)
Globulin: 2.3 g/dL (calc) (ref 1.9–3.7)
Glucose, Bld: 150 mg/dL — ABNORMAL HIGH (ref 65–99)
POTASSIUM: 4.3 mmol/L (ref 3.5–5.3)
Sodium: 137 mmol/L (ref 135–146)
Total Protein: 6.8 g/dL (ref 6.1–8.1)

## 2017-06-10 LAB — HEMOGLOBIN A1C
HEMOGLOBIN A1C: 7.1 %{Hb} — AB (ref <5.7)
MEAN PLASMA GLUCOSE: 157 (calc)
eAG (mmol/L): 8.7 (calc)

## 2017-06-23 ENCOUNTER — Other Ambulatory Visit: Payer: Self-pay | Admitting: Family Medicine

## 2017-07-16 ENCOUNTER — Ambulatory Visit: Payer: Managed Care, Other (non HMO) | Admitting: Family Medicine

## 2017-07-16 ENCOUNTER — Encounter: Payer: Self-pay | Admitting: Family Medicine

## 2017-07-16 VITALS — BP 120/68 | HR 66 | Temp 97.8°F | Resp 18 | Ht 67.0 in | Wt 228.0 lb

## 2017-07-16 DIAGNOSIS — M65342 Trigger finger, left ring finger: Secondary | ICD-10-CM | POA: Diagnosis not present

## 2017-07-16 NOTE — Progress Notes (Signed)
Subjective:    Patient ID: Kyle Ray, male    DOB: 09-08-1960, 57 y.o.   MRN: [id number]  HPI  Patient presents with pain and stiffness in his left hand on the volar surface of the fourth MCP joint.  Occasionally the finger will lock when he flexes his PIP and DIP joints at that area.  He will have to forcibly extend the finger.  Symptoms and pain are similar to pain he previously experienced with a trigger finger on his right hand.  He is here today requesting a cortisone injection to try to alleviate the trigger finger on his left hand.  On examination, there is no erythema or swelling or deformity in the fourth MCP joint.  There is a palpable nodule in the flexor tendon just proximal to the crease. Past Medical History:  Diagnosis Date  . Allergy    allergic rhinitis  . Diabetes mellitus   . Elevated LFTs   . Hyperlipidemia   . Hypertension   . Obesity    No past surgical history on file. Current Outpatient Medications on File Prior to Visit  Medication Sig Dispense Refill  . aspirin 81 MG tablet Take 81 mg by mouth at bedtime.     . canagliflozin (INVOKANA) 300 MG TABS tablet Take 1 tablet (300 mg total) by mouth daily before breakfast. 30 tablet 0  . cholecalciferol (VITAMIN D) 1000 UNITS tablet Take 1,000 Units by mouth at bedtime.      Marland Kitchen Cod Liver Oil 1000 MG CAPS Take 1 capsule by mouth daily.    . fish oil-omega-3 fatty acids 1000 MG capsule Take 1 g by mouth 2 (two) times daily. 1 in am, 2 in pm    . glucose blood test strip Check BS bid and prn  - Pt uses one touch ultra meter 100 each 12  . losartan (COZAAR) 50 MG tablet Take 1 tablet (50 mg total) by mouth daily. 90 tablet 3  . meloxicam (MOBIC) 15 MG tablet Take 1 tablet (15 mg total) by mouth daily. 30 tablet 0  . metFORMIN (GLUCOPHAGE) 1000 MG tablet TAKE 1 TABLET BY MOUTH TWICE DAILY 180 tablet 1  . Multiple Vitamins-Minerals (MULTIVITAMIN PO) Take 1 tablet by mouth daily.    . pioglitazone (ACTOS) 30 MG tablet  TAKE 1 TABLET EVERY DAY. 90 tablet 1  . pyridOXINE (VITAMIN B-6) 100 MG tablet Take 100 mg by mouth daily.      . sitaGLIPtin (JANUVIA) 100 MG tablet Take 1 tablet (100 mg total) by mouth every evening. 90 tablet 3  . vitamin C (ASCORBIC ACID) 500 MG tablet Take 500 mg by mouth at bedtime.       No current facility-administered medications on file prior to visit.    No Known Allergies Social History   Socioeconomic History  . Marital status: Married    Spouse name: Not on file  . Number of children: Not on file  . Years of education: Not on file  . Highest education level: Not on file  Social Needs  . Financial resource strain: Not on file  . Food insecurity - worry: Not on file  . Food insecurity - inability: Not on file  . Transportation needs - medical: Not on file  . Transportation needs - non-medical: Not on file  Occupational History  . Not on file  Tobacco Use  . Smoking status: Never Smoker  . Smokeless tobacco: Former Neurosurgeon    Types: Chew  Substance and Sexual  Activity  . Alcohol use: No  . Drug use: No    Comment: Used "cocaine" as young adult  . Sexual activity: Not on file  Other Topics Concern  . Not on file  Social History Narrative  . Not on file     Review of Systems  All other systems reviewed and are negative.      Objective:   Physical Exam  Cardiovascular: Normal rate, regular rhythm and normal heart sounds.  Pulmonary/Chest: Effort normal and breath sounds normal.  Musculoskeletal:       Left hand: He exhibits decreased range of motion and tenderness. He exhibits no bony tenderness, no deformity and no swelling. Normal sensation noted. Normal strength noted.       Hands: Vitals reviewed.         Assessment & Plan:  Trigger ring finger of left hand  Area circled in green on the diagram was cleaned thoroughly with Betadine.  Just adjacent to the nodule in the flexor tendon a mixture of 1/2 cc of lidocaine and 1/2 cc of 40 mg/mL Kenalog  was injected through a 23-gauge needle.  Patient tolerated the procedure well without complication.  Recommended rest for the next 2 weeks and trying to minimize use of the hand to allow the inflammation to subside.  Consult hand surgery if persistent

## 2017-11-04 ENCOUNTER — Ambulatory Visit: Payer: Managed Care, Other (non HMO) | Admitting: Family Medicine

## 2017-11-04 ENCOUNTER — Encounter: Payer: Self-pay | Admitting: Family Medicine

## 2017-11-04 VITALS — BP 130/70 | HR 78 | Temp 97.9°F | Resp 18 | Ht 67.0 in | Wt 229.0 lb

## 2017-11-04 DIAGNOSIS — M7631 Iliotibial band syndrome, right leg: Secondary | ICD-10-CM | POA: Diagnosis not present

## 2017-11-04 DIAGNOSIS — M5431 Sciatica, right side: Secondary | ICD-10-CM | POA: Diagnosis not present

## 2017-11-04 MED ORDER — MELOXICAM 15 MG PO TABS: 15.0000 mg | ORAL_TABLET | Freq: Every day | ORAL | 1 refills | Status: DC

## 2017-11-04 MED ORDER — CYCLOBENZAPRINE HCL 10 MG PO TABS: 10.0000 mg | ORAL_TABLET | Freq: Three times a day (TID) | ORAL | 0 refills | Status: DC | PRN

## 2017-11-04 NOTE — Progress Notes (Signed)
Subjective:    Patient ID: Kyle Ray, male    DOB: [birth year], 57 y.o.   MRN: [id number] Patient presents with 1 week history of pain in the outer aspect of his right hip.  Pain is located over the greater trochanteric bursa area and radiates down the lateral aspect of his right thigh to his right knee.  Pain was triggered by rotational lifting at work.  He has pain now in his lateral right hip with walking however he also has some numbness and tingling radiating down the posterior aspect of his right leg all the way into his right foot.  Also at times, his right foot feels numb.  On physical exam, he has an extremely tight IT band which reproduces his pain.  He also has pain with FABER.  He has a negative straight leg raise Past Medical History:  Diagnosis Date  . Allergy    allergic rhinitis  . Diabetes mellitus   . Elevated LFTs   . Hyperlipidemia   . Hypertension   . Obesity    No past surgical history on file. Current Outpatient Medications on File Prior to Visit  Medication Sig Dispense Refill  . aspirin 81 MG tablet Take 81 mg by mouth at bedtime.     . cholecalciferol (VITAMIN D) 1000 UNITS tablet Take 1,000 Units by mouth at bedtime.      Marland Kitchen Cod Liver Oil 1000 MG CAPS Take 1 capsule by mouth daily.    . fish oil-omega-3 fatty acids 1000 MG capsule Take 1 g by mouth 2 (two) times daily. 1 in am, 2 in pm    . glucose blood test strip Check BS bid and prn  - Pt uses one touch ultra meter 100 each 12  . losartan (COZAAR) 50 MG tablet Take 1 tablet (50 mg total) by mouth daily. 90 tablet 3  . metFORMIN (GLUCOPHAGE) 1000 MG tablet TAKE 1 TABLET BY MOUTH TWICE DAILY 180 tablet 1  . Multiple Vitamins-Minerals (MULTIVITAMIN PO) Take 1 tablet by mouth daily.    . pioglitazone (ACTOS) 30 MG tablet TAKE 1 TABLET EVERY DAY. 90 tablet 1  . pyridOXINE (VITAMIN B-6) 100 MG tablet Take 100 mg by mouth daily.      . sitaGLIPtin (JANUVIA) 100 MG tablet Take 1 tablet (100 mg total) by mouth  every evening. 90 tablet 3  . vitamin C (ASCORBIC ACID) 500 MG tablet Take 500 mg by mouth at bedtime.       No current facility-administered medications on file prior to visit.    No Known Allergies Social History   Socioeconomic History  . Marital status: Married    Spouse name: Not on file  . Number of children: Not on file  . Years of education: Not on file  . Highest education level: Not on file  Occupational History  . Not on file  Social Needs  . Financial resource strain: Not on file  . Food insecurity:    Worry: Not on file    Inability: Not on file  . Transportation needs:    Medical: Not on file    Non-medical: Not on file  Tobacco Use  . Smoking status: Never Smoker  . Smokeless tobacco: Former Neurosurgeon    Types: Chew  Substance and Sexual Activity  . Alcohol use: No  . Drug use: No    Comment: Used "cocaine" as young adult  . Sexual activity: Not on file  Lifestyle  . Physical activity:  Days per week: Not on file    Minutes per session: Not on file  . Stress: Not on file  Relationships  . Social connections:    Talks on phone: Not on file    Gets together: Not on file    Attends religious service: Not on file    Active member of club or organization: Not on file    Attends meetings of clubs or organizations: Not on file    Relationship status: Not on file  . Intimate partner violence:    Fear of current or ex partner: Not on file    Emotionally abused: Not on file    Physically abused: Not on file    Forced sexual activity: Not on file  Other Topics Concern  . Not on file  Social History Narrative  . Not on file   Family History  Problem Relation Age of Onset  . Hyperlipidemia Father   . Hypertension Father   . Heart disease Father   . Alcohol abuse Father       Review of Systems  All other systems reviewed and are negative.      Objective:   Physical Exam  Constitutional: He is oriented to person, place, and time. He appears  well-developed and well-nourished. No distress.  Cardiovascular: Normal rate, regular rhythm and intact distal pulses. Exam reveals no gallop and no friction rub.  Murmur heard. Pulmonary/Chest: Effort normal and breath sounds normal. No respiratory distress. He has no wheezes. He has no rales.  Musculoskeletal:       Right hip: He exhibits decreased range of motion, decreased strength, tenderness and bony tenderness.       Lumbar back: He exhibits normal range of motion, no tenderness, no bony tenderness and no pain.  Neurological: He is alert and oriented to person, place, and time. He has normal reflexes. No cranial nerve deficit. He exhibits normal muscle tone. Coordination normal.  Skin: Skin is warm and dry. No rash noted. He is not diaphoretic.  Vitals reviewed.  Positive Ober's test.  Pain with FABER.  Negative straight leg raise.  Nml reflexes, ms 5/5 equal and symmetric in the upper and lower extremities       Assessment & Plan:  It band syndrome, right  Right sided sciatica  We will start the patient on a combination of meloxicam 15 mg a day as an anti-inflammatory coupled with Flexeril 5 to 10 mg every 8 hours as needed for muscle stiffness and muscle tightness in the lateral aspect of his right hip coupled with hip exercises and stretches to stretch the IT band and the hip abductors.  Consider prednisone if sciatica worsens.

## 2017-11-23 ENCOUNTER — Other Ambulatory Visit: Payer: Self-pay | Admitting: Family Medicine

## 2017-12-08 ENCOUNTER — Other Ambulatory Visit: Payer: Managed Care, Other (non HMO)

## 2017-12-08 DIAGNOSIS — E785 Hyperlipidemia, unspecified: Secondary | ICD-10-CM

## 2017-12-08 DIAGNOSIS — E119 Type 2 diabetes mellitus without complications: Secondary | ICD-10-CM

## 2017-12-09 LAB — COMPREHENSIVE METABOLIC PANEL
AG Ratio: 1.6 (calc) (ref 1.0–2.5)
ALBUMIN MSPROF: 4.3 g/dL (ref 3.6–5.1)
ALT: 36 U/L (ref 9–46)
AST: 27 U/L (ref 10–35)
Alkaline phosphatase (APISO): 61 U/L (ref 40–115)
BUN: 11 mg/dL (ref 7–25)
CO2: 25 mmol/L (ref 20–32)
CREATININE: 0.95 mg/dL (ref 0.70–1.33)
Calcium: 9.5 mg/dL (ref 8.6–10.3)
Chloride: 102 mmol/L (ref 98–110)
GLUCOSE: 182 mg/dL — AB (ref 65–99)
Globulin: 2.7 g/dL (calc) (ref 1.9–3.7)
POTASSIUM: 4.1 mmol/L (ref 3.5–5.3)
Sodium: 136 mmol/L (ref 135–146)
Total Bilirubin: 0.8 mg/dL (ref 0.2–1.2)
Total Protein: 7 g/dL (ref 6.1–8.1)

## 2017-12-09 LAB — HEMOGLOBIN A1C
EAG (MMOL/L): 9 (calc)
HEMOGLOBIN A1C: 7.3 %{Hb} — AB (ref <5.7)
Mean Plasma Glucose: 163 (calc)

## 2017-12-09 LAB — LIPID PANEL
CHOL/HDL RATIO: 3.9 (calc) (ref <5.0)
Cholesterol: 165 mg/dL (ref <200)
HDL: 42 mg/dL (ref >40)
LDL CHOLESTEROL (CALC): 100 mg/dL — AB
Non-HDL Cholesterol (Calc): 123 mg/dL (calc) (ref <130)
TRIGLYCERIDES: 130 mg/dL (ref <150)

## 2017-12-13 ENCOUNTER — Other Ambulatory Visit: Payer: Self-pay | Admitting: Family Medicine

## 2017-12-13 MED ORDER — EMPAGLIFLOZIN 25 MG PO TABS: 25.0000 mg | ORAL_TABLET | Freq: Every day | ORAL | 3 refills | Status: DC

## 2017-12-13 MED ORDER — ATORVASTATIN CALCIUM 20 MG PO TABS: 20.0000 mg | ORAL_TABLET | Freq: Every day | ORAL | 3 refills | Status: DC

## 2017-12-14 ENCOUNTER — Telehealth: Payer: Self-pay | Admitting: Physician Assistant

## 2017-12-14 NOTE — Telephone Encounter (Signed)
Patient is calling to ask if he should take his Jardiance at night instead of morning, the bottle says it could cause dizziness  [phone]

## 2017-12-15 NOTE — Telephone Encounter (Signed)
Called pt and informed that he can take either in AM or PM, whichever he chooses is fine.

## 2017-12-20 ENCOUNTER — Ambulatory Visit: Payer: Managed Care, Other (non HMO) | Admitting: Physician Assistant

## 2017-12-23 ENCOUNTER — Other Ambulatory Visit: Payer: Self-pay | Admitting: Physician Assistant

## 2017-12-23 MED ORDER — PIOGLITAZONE HCL 30 MG PO TABS: 30.0000 mg | ORAL_TABLET | Freq: Every day | ORAL | 3 refills | Status: DC

## 2017-12-23 NOTE — Telephone Encounter (Signed)
Patient called states he has 1 actos left and would like a new prescription called into [facility]  CB# [phone]

## 2017-12-23 NOTE — Telephone Encounter (Signed)
Prescription sent to pharmacy.

## 2018-01-11 ENCOUNTER — Other Ambulatory Visit: Payer: Self-pay | Admitting: Family Medicine

## 2018-02-21 ENCOUNTER — Ambulatory Visit: Payer: Managed Care, Other (non HMO) | Admitting: Family Medicine

## 2018-02-21 ENCOUNTER — Other Ambulatory Visit: Payer: Self-pay | Admitting: Family Medicine

## 2018-02-21 VITALS — BP 124/68 | HR 64 | Temp 97.6°F | Wt 223.0 lb

## 2018-02-21 DIAGNOSIS — M65342 Trigger finger, left ring finger: Secondary | ICD-10-CM | POA: Diagnosis not present

## 2018-02-21 NOTE — Progress Notes (Signed)
Subjective:    Patient ID: Kyle Ray, male    DOB: 09-25-1960, 57 y.o.   MRN: [id number]  HPI Patient requests a cortisone shot for a trigger finger.  The fourth MCP joint on his left hand has been locking whenever he makes a fist over the last month.  He has full range of motion in the PIP and DIP joints however if he makes a fist and bend the MCP joint, it locks and he has to physically extend that joint.  Past Medical History:  Diagnosis Date  . Allergy    allergic rhinitis  . Diabetes mellitus   . Elevated LFTs   . Hyperlipidemia   . Hypertension   . Obesity    No past surgical history on file. Current Outpatient Medications on File Prior to Visit  Medication Sig Dispense Refill  . aspirin 81 MG tablet Take 81 mg by mouth at bedtime.     Marland Kitchen atorvastatin (LIPITOR) 20 MG tablet Take 1 tablet (20 mg total) by mouth daily. 90 tablet 3  . cholecalciferol (VITAMIN D) 1000 UNITS tablet Take 1,000 Units by mouth at bedtime.      Marland Kitchen Cod Liver Oil 1000 MG CAPS Take 1 capsule by mouth daily.    . empagliflozin (JARDIANCE) 25 MG TABS tablet Take 25 mg by mouth daily. 30 tablet 3  . fish oil-omega-3 fatty acids 1000 MG capsule Take 1 g by mouth 2 (two) times daily. 1 in am, 2 in pm    . losartan (COZAAR) 50 MG tablet Take 1 tablet (50 mg total) by mouth daily. 90 tablet 3  . metFORMIN (GLUCOPHAGE) 1000 MG tablet TAKE 1 TABLET BY MOUTH TWICE DAILY 180 tablet 1  . Multiple Vitamins-Minerals (MULTIVITAMIN PO) Take 1 tablet by mouth daily.    . pioglitazone (ACTOS) 30 MG tablet Take 1 tablet (30 mg total) by mouth daily. 30 tablet 3  . pyridOXINE (VITAMIN B-6) 100 MG tablet Take 100 mg by mouth daily.      . vitamin C (ASCORBIC ACID) 500 MG tablet Take 500 mg by mouth at bedtime.      . cyclobenzaprine (FLEXERIL) 10 MG tablet Take 1 tablet (10 mg total) by mouth 3 (three) times daily as needed for muscle spasms. (Patient not taking: Reported on 02/21/2018) 30 tablet 0  . glucose blood test  strip Check BS bid and prn  - Pt uses one touch ultra meter 100 each 12  . meloxicam (MOBIC) 15 MG tablet Take 1 tablet (15 mg total) by mouth daily. (Patient not taking: Reported on 02/21/2018) 30 tablet 1   No current facility-administered medications on file prior to visit.    No Known Allergies Social History   Socioeconomic History  . Marital status: Married    Spouse name: Not on file  . Number of children: Not on file  . Years of education: Not on file  . Highest education level: Not on file  Occupational History  . Not on file  Social Needs  . Financial resource strain: Not on file  . Food insecurity:    Worry: Not on file    Inability: Not on file  . Transportation needs:    Medical: Not on file    Non-medical: Not on file  Tobacco Use  . Smoking status: Never Smoker  . Smokeless tobacco: Former Neurosurgeon    Types: Chew  Substance and Sexual Activity  . Alcohol use: No  . Drug use: No  Comment: Used "cocaine" as young adult  . Sexual activity: Not on file  Lifestyle  . Physical activity:    Days per week: Not on file    Minutes per session: Not on file  . Stress: Not on file  Relationships  . Social connections:    Talks on phone: Not on file    Gets together: Not on file    Attends religious service: Not on file    Active member of club or organization: Not on file    Attends meetings of clubs or organizations: Not on file    Relationship status: Not on file  . Intimate partner violence:    Fear of current or ex partner: Not on file    Emotionally abused: Not on file    Physically abused: Not on file    Forced sexual activity: Not on file  Other Topics Concern  . Not on file  Social History Narrative  . Not on file    Review of Systems  All other systems reviewed and are negative.      Objective:   Physical Exam  Constitutional: He appears well-developed and well-nourished.  Cardiovascular: Normal rate and regular rhythm.  Pulmonary/Chest:  Effort normal and breath sounds normal.  Musculoskeletal:       Left hand: He exhibits normal range of motion, no tenderness and no bony tenderness. Normal sensation noted. Normal strength noted.       Hands: Vitals reviewed. I am unable to elicit any locking at the fourth MCP joint today.  However the area diagrammed with red is the area where the patient is experiencing locking.        Assessment & Plan:  Trigger ring finger of left hand  The area was prepped with Betadine.  After obtaining informed consent, a mixture of 1/2 cc of 40 mg/mL Kenalog and 1/2 cc of 0.1% lidocaine was injected adjacent to the flexor tendon sheath at the fourth MCP joint on the palmar surface of the left hand.  The patient tolerated the procedure well without complication.  If trigger finger continues, consult hand surgery

## 2018-03-22 ENCOUNTER — Ambulatory Visit: Payer: Managed Care, Other (non HMO) | Admitting: Family Medicine

## 2018-03-22 ENCOUNTER — Other Ambulatory Visit: Payer: Self-pay

## 2018-03-22 ENCOUNTER — Encounter: Payer: Self-pay | Admitting: Family Medicine

## 2018-03-22 VITALS — BP 140/76 | HR 82 | Temp 98.3°F | Resp 14 | Ht 67.0 in | Wt 220.0 lb

## 2018-03-22 DIAGNOSIS — S91209A Unspecified open wound of unspecified toe(s) with damage to nail, initial encounter: Secondary | ICD-10-CM

## 2018-03-22 DIAGNOSIS — S91205A Unspecified open wound of left lesser toe(s) with damage to nail, initial encounter: Secondary | ICD-10-CM | POA: Diagnosis not present

## 2018-03-22 MED ORDER — TRAMADOL HCL 50 MG PO TABS: 50.0000 mg | ORAL_TABLET | Freq: Two times a day (BID) | ORAL | 0 refills | Status: AC | PRN

## 2018-03-22 NOTE — Patient Instructions (Signed)
Fingernail or Toenail Removal, Adult, Care After This sheet gives you information about how to care for yourself after your procedure. Your health care provider may also give you more specific instructions. If you have problems or questions, contact your health care provider. What can I expect after the procedure? After the procedure, it is common to have:  Pain.  Redness.  Swelling.  Soreness.  Follow these instructions at home:  If you have a splint: ? Do not put pressure on any part of the splint until it is fully hardened. This may take several hours. ? Wear the splint as told by your health care provider. Remove it only as told by your health care provider. ? Loosen the splint if your fingers or toes tingle, become numb, or turn cold and blue. ? Keep the splint clean. ? If the splint is not waterproof:  Do not let it get wet.  Cover it with a watertight covering when you take a bath or a shower. Wound care   Follow instructions from your health care provider about how to take care of your wound. Make sure you: ? Wash your hands with soap and water before you change your bandage (dressing). If soap and water are not available, use hand sanitizer. ? Change your dressing as told by your health care provider. ? Keep your dressing dry until your health care provider says it can be removed. ? Leave stitches (sutures), skin glue, or adhesive strips in place. These skin closures may need to stay in place for 2 weeks or longer. If adhesive strip edges start to loosen and curl up, you may trim the loose edges. Do not remove adhesive strips completely unless your health care provider tells you to do that.  Check your wound every day for signs of infection. Check for: ? More redness, swelling, or pain. ? More fluid or blood. ? Warmth. ? Pus or a bad smell. Managing pain, stiffness, and swelling  Move your fingers or toes often to avoid stiffness and to lessen swelling.  Raise  (elevate) the injured area above the level of your heart while you are sitting or lying down. You may need to keep your finger or toe raised or supported on a pillow for 24 hours or as told by your health care provider.  Soak your hand or foot in warm, soapy water for 10-20 minutes, 3 times a day or as told by your health care provider. Medicine  Take over-the-counter and prescription medicines only as told by your health care provider.  If you were prescribed an antibiotic medicine, use it as told by your health care provider. Do not stop using the antibiotic even if your condition improves. General instructions  If you were given a shoe to wear, wear it as told by your health care provider.  Keep all follow-up visits as told by your health care provider. This is important. Contact a health care provider if:  You have more redness, swelling, or pain around your wound.  You have more fluid or blood coming from your wound.  Your wound feels warm to the touch.  You have pus or a bad smell coming from your wound.  You have a fever.  Your finger or toe looks blue or black. This information is not intended to replace advice given to you by your health care provider. Make sure you discuss any questions you have with your health care provider. Document Released: 06/01/2014 Document Revised: 01/08/2016 Document Reviewed: 11/18/2015 Elsevier  Interactive Patient Education  2018 [employer]    Nail Avulsion Nail avulsion is when a nail tears away from the nail bed due to an accident or an injury. Nail avulsion can be painful. Your finger or toe may bleed a lot, and you may have some pain, redness, throbbing, and swelling while it heals. Your nail will grow back within several months. Once it grows back, it might not look the same. This may happen even after taking good care of it. Follow these instructions at home: Wound care   Clean any dirt and debris from the wound.  If you notice  bleeding, press gently on the nailbed with a gauze pad. Do this for 15 minutes.  If a health care provider closed your wound with stitches (sutures), leave them in place. They may need to stay in place for 2 weeks or longer. You may need to see your health care provider to have them removed.  Keep the wound dry for 48 hours. After 48 hours have passed, lightly wash the finger or toe in warm, soapy water 2-3 times a day. This helps to reduce pain and swelling and prevent infection. Dressing Care  Cover the wound with a clean gauze bandage (dressing). You may be able to stop wearing a dressing after 2?7 days.  Wash your hands with soap and water before you change your dressing. If soap and water are not available, use hand sanitizer.  Change the dressing once or twice a day. Always change the dressing: ? If the dressing gets wet or dirty. ? After washing your finger or toe. Medicine  Take over-the-counter pain medicine as needed. Do not take aspirin or products containing aspirin unless directed by your health care provider. These products can increase bleeding.  If you were prescribed an antibiotic medicine, take it or apply it as told by your health care provider. Do not stop taking or using the antibiotic even if you start to feel better. General instructions  Keep the hand or foot with the nail injury raised above the level of your heart as much as possible. This helps to reduce pain and swelling.  Move the toe or finger often to avoid stiffness.  Do not smoke. Smoking can delay healing. If you need help quitting, talk to your health care provider. Contact a health care provider if:  You have swelling or pain that gets worse instead of better.  You have fluid, blood, or pus coming from your wound.  Your wound smells bad. Get help right away if:  You have bleeding that does not stop, even when you apply pressure to the wound.  You have a temperature that is higher than 104F  (40C).  You cannot move your fingers or toes.  The affected finger or toe looks white or black. This information is not intended to replace advice given to you by your health care provider. Make sure you discuss any questions you have with your health care provider. Document Released: 06/18/2004 Document Revised: 01/09/2016 Document Reviewed: 09/26/2014 Elsevier Interactive Patient Education  [employer].

## 2018-03-22 NOTE — Progress Notes (Signed)
Patient ID: Kyle Ray, male    DOB: 04-11-1961, 57 y.o.   MRN: [id number]  PCP: Dorena Bodo, PA-C  Chief Complaint  Patient presents with  . Toenail Issues    second digit on L foot nail lifting up and discolored    Subjective:   Kyle Ray is a 57 y.o. male, presents to clinic with CC of pain to left 2nd toe with partial toenail avulsion and trauma, the nail was already very thickened and yellow , injury happened a few days ago, patient was on his knees with his feet flat against the ground when his wife accidentally stepped on it crushing his toe and toenail.  It was lifted that day and sore but afterwards has not been very painful and he has had it wrapped up or Band-Aids over it.  He has been wearing steel toed boots at work which have protected it.  He cannot say whether or not the nail was thickened prior to the injury or not however multiple of his toenails are thickened and yellow.  He is here for eval and nail removal if needed. Pain currently described as sore, intermittent, severity is mild.  Worse with any palpation or manipulation of his toe or toenail, improved with protecting it.  No other alleviating or aggravating factors.  He does not have any current purulent drainage or active bleeding.  He has mild swelling and redness on one side of his toenail but otherwise he has no pain, swelling, redness or discoloration to his toes or foot.   Patient Active Problem List   Diagnosis Date Noted  . Pain in joint, shoulder region 11/15/2013  . Elevated LFTs 01/14/2012  . DIABETES MELLITUS, TYPE II, UNCONTROLLED 11/28/2008  . HYPERLIPIDEMIA 03/26/2006  . OBESITY NOS 03/26/2006  . Essential hypertension 03/26/2006  . ALLERGIC RHINITIS 03/26/2006     Prior to Admission medications   Medication Sig Start Date End Date Taking? Authorizing Provider  aspirin 81 MG tablet Take 81 mg by mouth at bedtime.    Yes [provider]  atorvastatin (LIPITOR) 20 MG tablet  Take 1 tablet (20 mg total) by mouth daily. 12/13/17  Yes Donita Brooks, MD  cholecalciferol (VITAMIN D) 1000 UNITS tablet Take 1,000 Units by mouth at bedtime.     Yes [provider]  Cod Liver Oil 1000 MG CAPS Take 1 capsule by mouth daily.   Yes [provider]  cyclobenzaprine (FLEXERIL) 10 MG tablet Take 1 tablet (10 mg total) by mouth 3 (three) times daily as needed for muscle spasms. 11/04/17  Yes Donita Brooks, MD  empagliflozin (JARDIANCE) 25 MG TABS tablet Take 25 mg by mouth daily. 12/13/17  Yes Donita Brooks, MD  fish oil-omega-3 fatty acids 1000 MG capsule Take 1 g by mouth 2 (two) times daily. 1 in am, 2 in pm   Yes [provider]  glucose blood test strip Check BS bid and prn  - Pt uses one touch ultra meter 07/27/14  Yes Donita Brooks, MD  losartan (COZAAR) 50 MG tablet TAKE ONE TABLET BY MOUTH ONCE DAILY. 02/22/18  Yes Donita Brooks, MD  meloxicam (MOBIC) 15 MG tablet Take 1 tablet (15 mg total) by mouth daily. 11/04/17  Yes Donita Brooks, MD  metFORMIN (GLUCOPHAGE) 1000 MG tablet TAKE 1 TABLET BY MOUTH TWICE DAILY 01/12/18  Yes Donita Brooks, MD  Multiple Vitamins-Minerals (MULTIVITAMIN PO) Take 1 tablet by mouth daily.  Yes [provider]  pioglitazone (ACTOS) 30 MG tablet Take 1 tablet (30 mg total) by mouth daily. 12/23/17  Yes Donita Brooks, MD  pyridOXINE (VITAMIN B-6) 100 MG tablet Take 100 mg by mouth daily.     Yes [provider]  vitamin C (ASCORBIC ACID) 500 MG tablet Take 500 mg by mouth at bedtime.     Yes [provider]     No Known Allergies   Family History  Problem Relation Age of Onset  . Hyperlipidemia Father   . Hypertension Father   . Heart disease Father   . Alcohol abuse Father      Social History   Socioeconomic History  . Marital status: Married    Spouse name: Not on file  . Number of children: Not on file  . Years of education: Not on file  . Highest  education level: Not on file  Occupational History  . Not on file  Social Needs  . Financial resource strain: Not on file  . Food insecurity:    Worry: Not on file    Inability: Not on file  . Transportation needs:    Medical: Not on file    Non-medical: Not on file  Tobacco Use  . Smoking status: Never Smoker  . Smokeless tobacco: Former Neurosurgeon    Types: Chew  Substance and Sexual Activity  . Alcohol use: No  . Drug use: No    Comment: Used "cocaine" as young adult  . Sexual activity: Not on file  Lifestyle  . Physical activity:    Days per week: Not on file    Minutes per session: Not on file  . Stress: Not on file  Relationships  . Social connections:    Talks on phone: Not on file    Gets together: Not on file    Attends religious service: Not on file    Active member of club or organization: Not on file    Attends meetings of clubs or organizations: Not on file    Relationship status: Not on file  . Intimate partner violence:    Fear of current or ex partner: Not on file    Emotionally abused: Not on file    Physically abused: Not on file    Forced sexual activity: Not on file  Other Topics Concern  . Not on file  Social History Narrative  . Not on file     Review of Systems  Constitutional: Negative.   HENT: Negative.   Eyes: Negative.   Respiratory: Negative.   Cardiovascular: Negative.   Gastrointestinal: Negative.   Endocrine: Negative.   Genitourinary: Negative.   Musculoskeletal: Negative.   Skin: Negative.   Allergic/Immunologic: Negative.   Neurological: Negative.   Hematological: Negative.   Psychiatric/Behavioral: Negative.   All other systems reviewed and are negative.      Objective:    Vitals:   03/22/18 1144  BP: 140/76  Pulse: 82  Resp: 14  Temp: 98.3 F (36.8 C)  TempSrc: Oral  SpO2: 97%  Weight: 220 lb (99.8 kg)  Height: 5\' 7"  (1.702 m)       Physical Exam  Constitutional: He appears well-developed.  HENT:  Head:  Normocephalic and atraumatic.  Nose: Nose normal.  Eyes: Conjunctivae are normal. Right eye exhibits no discharge. Left eye exhibits no discharge.  Neck: No tracheal deviation present.  Cardiovascular: Normal rate and regular rhythm.  Pulmonary/Chest: Effort normal. No stridor. No respiratory distress.  Musculoskeletal: Normal  range of motion.  Neurological: He is alert. He exhibits normal muscle tone. Coordination normal.  Skin: Skin is warm and dry. No rash noted.  Multiple thickened yellowed toe nails, most thickening to edges 2nd left toe with 5mm thick yellow nail, partially avulsed from nailbed, minimally attached at proximal nail margin, mild lateral nail fold erythema and edema, no active bleeding, no purulent drainage, no induration of toe, very mild ttp to nail and distal toe, good sensation of toe, good capillary refill.  Psychiatric: He has a normal mood and affect. His behavior is normal.  Nursing note and vitals reviewed.   Nail Removal Date/Time: 03/28/2018 8:32 AM Performed by: Danelle Berry, PA-C Authorized by: Danelle Berry, PA-C   Consent:    Consent obtained:  Verbal   Consent given by:  Patient   Risks discussed:  Permanent nail deformity, pain, infection, incomplete removal and bleeding   Alternatives discussed:  Referral Location:    Foot:  L second toe Pre-procedure details:    Skin preparation:  Alcohol and Betadine   Preparation: Patient was prepped and draped in the usual sterile fashion   Anesthesia (see MAR for exact dosages):    Anesthesia method:  Nerve block   Block location:  Left 2nd toe digital block   Block needle gauge:  25 G   Block anesthetic:  Lidocaine 1% w/o epi   Block injection procedure:  Anatomic landmarks identified, anatomic landmarks palpated, negative aspiration for blood and introduced needle   Block outcome:  Anesthesia achieved Nail Removal:    Nail removed:  Complete   Nail bed repaired: no     Removed nail replaced and  anchored: no   Trephination:    Subungual hematoma drained: no   Ingrown nail:    Wedge excision of skin: no   Post-procedure details:    Dressing:  Xeroform gauze   Patient tolerance of procedure:  Tolerated well, no immediate complications       Assessment & Plan:      ICD-10-CM   1. Nail avulsion of toe, initial encounter S91.209A traMADol (ULTRAM) 50 MG tablet    Nail Removal    Traumatic partial left second toenail avulsion, given extent of severe thickening of nail consistent with fungal infection, feel that the best treatment option is to remove the nail completely, he has minimal pain and only mild edema and erythema on the lateral aspect of the nail margin does not appear to have any paronychia, no other signs of infection and no active bleeding.  Described and discussed procedure, risks/benefits and with the traumatic partial avulsion in the state of nail disease discussed that may be a gradual process to grow nail back or he may not have good result with new nail growth secondary to trauma.  Patient verbalized understanding this possible outcome and agreed to still have the toenail removed here in clinic.  Digital block and nail removal without difficulty, wound care and frequent soaks were discussed with him and instructions printed for him.  Also given pain medication for expected tenderness for the next several days I would gradually improved.  With his diabetes he was encouraged to contact us immediately if there is any signs of purulent drainage any swelling or redness to toe.  Recheck in 5-7 days.   Danelle Berry, PA-C 03/22/18 11:59 AM

## 2018-03-23 ENCOUNTER — Ambulatory Visit: Payer: Managed Care, Other (non HMO)

## 2018-03-23 ENCOUNTER — Encounter: Payer: Self-pay | Admitting: Physician Assistant

## 2018-03-23 DIAGNOSIS — S91209A Unspecified open wound of unspecified toe(s) with damage to nail, initial encounter: Secondary | ICD-10-CM

## 2018-03-23 NOTE — Progress Notes (Signed)
Patient came in to have a new dressing applied to his second left toe. A non adherent gauze and sterile gauze with paper tape was applied. Patient tolerated well. Instructed patient on how to redress at home and to keep dressing dry. Patient verbalized understanding.

## 2018-03-28 DIAGNOSIS — S91205A Unspecified open wound of left lesser toe(s) with damage to nail, initial encounter: Secondary | ICD-10-CM

## 2018-03-30 ENCOUNTER — Other Ambulatory Visit: Payer: Self-pay

## 2018-03-30 ENCOUNTER — Encounter: Payer: Self-pay | Admitting: Family Medicine

## 2018-03-30 ENCOUNTER — Ambulatory Visit (INDEPENDENT_AMBULATORY_CARE_PROVIDER_SITE_OTHER): Payer: Managed Care, Other (non HMO) | Admitting: Family Medicine

## 2018-03-30 VITALS — BP 138/72 | HR 68 | Temp 97.9°F | Resp 14 | Ht 67.0 in | Wt 216.0 lb

## 2018-03-30 DIAGNOSIS — E1169 Type 2 diabetes mellitus with other specified complication: Secondary | ICD-10-CM

## 2018-03-30 DIAGNOSIS — S91209D Unspecified open wound of unspecified toe(s) with damage to nail, subsequent encounter: Secondary | ICD-10-CM | POA: Diagnosis not present

## 2018-03-30 DIAGNOSIS — B351 Tinea unguium: Secondary | ICD-10-CM | POA: Diagnosis not present

## 2018-03-30 NOTE — Progress Notes (Signed)
Patient ID: Kyle Ray, male    DOB: 1961-03-16, 57 y.o.   MRN: [id number]  PCP: Dorena Bodo, PA-C  Chief Complaint  Patient presents with  . Follow-up    nail avulsion    Subjective:   03/30/18 Patient returns for recheck after nail removal from traumatic partial nail avulsion.  He has very minimal pain, slightly tender if he bangs it he has been using a bulky gauze dressing and soap and applying direct ointment.  He did not follow instructions to soak it frequently the first couple days after procedure.  He did use pain medicine and is currently not using any.  He has some dried blood to the proximal edge, but it nail bed has started to dry up slightly, he has no purulent drainage she has no erythema or swelling it has gradually improved.  He is using a topical over-the-counter antifungal treatment for his remaining thickened and yellow nails.   03/22/18 HPI: Kyle Ray is a 57 y.o. male, presents to clinic with CC of pain to left 2nd toe with partial toenail avulsion and trauma, the nail was already very thickened and yellow , injury happened a few days ago, patient was on his knees with his feet flat against the ground when his wife accidentally stepped on it crushing his toe and toenail.  It was lifted that day and sore but afterwards has not been very painful and he has had it wrapped up or Band-Aids over it.  He has been wearing steel toed boots at work which have protected it.  He cannot say whether or not the nail was thickened prior to the injury or not however multiple of his toenails are thickened and yellow.  He is here for eval and nail removal if needed. Pain currently described as sore, intermittent, severity is mild.  Worse with any palpation or manipulation of his toe or toenail, improved with protecting it.  No other alleviating or aggravating factors.  He does not have any current purulent drainage or active bleeding.  He has mild swelling and redness on one side  of his toenail but otherwise he has no pain, swelling, redness or discoloration to his toes or foot.   Patient Active Problem List   Diagnosis Date Noted  . Pain in joint, shoulder region 11/15/2013  . Elevated LFTs 01/14/2012  . DIABETES MELLITUS, TYPE II, UNCONTROLLED 11/28/2008  . HYPERLIPIDEMIA 03/26/2006  . OBESITY NOS 03/26/2006  . Essential hypertension 03/26/2006  . ALLERGIC RHINITIS 03/26/2006     Prior to Admission medications   Medication Sig Start Date End Date Taking? Authorizing Provider  aspirin 81 MG tablet Take 81 mg by mouth at bedtime.    Yes [provider]  atorvastatin (LIPITOR) 20 MG tablet Take 1 tablet (20 mg total) by mouth daily. 12/13/17  Yes Donita Brooks, MD  cholecalciferol (VITAMIN D) 1000 UNITS tablet Take 1,000 Units by mouth at bedtime.     Yes [provider]  Cod Liver Oil 1000 MG CAPS Take 1 capsule by mouth daily.   Yes [provider]  cyclobenzaprine (FLEXERIL) 10 MG tablet Take 1 tablet (10 mg total) by mouth 3 (three) times daily as needed for muscle spasms. 11/04/17  Yes Donita Brooks, MD  empagliflozin (JARDIANCE) 25 MG TABS tablet Take 25 mg by mouth daily. 12/13/17  Yes Donita Brooks, MD  fish oil-omega-3 fatty acids 1000 MG capsule Take 1 g by mouth 2 (two) times  daily. 1 in am, 2 in pm   Yes [provider]  glucose blood test strip Check BS bid and prn  - Pt uses one touch ultra meter 07/27/14  Yes Donita Brooks, MD  losartan (COZAAR) 50 MG tablet TAKE ONE TABLET BY MOUTH ONCE DAILY. 02/22/18  Yes Donita Brooks, MD  meloxicam (MOBIC) 15 MG tablet Take 1 tablet (15 mg total) by mouth daily. 11/04/17  Yes Donita Brooks, MD  metFORMIN (GLUCOPHAGE) 1000 MG tablet TAKE 1 TABLET BY MOUTH TWICE DAILY 01/12/18  Yes Donita Brooks, MD  Multiple Vitamins-Minerals (MULTIVITAMIN PO) Take 1 tablet by mouth daily.   Yes [provider]  pioglitazone (ACTOS) 30 MG tablet Take 1 tablet (30  mg total) by mouth daily. 12/23/17  Yes Donita Brooks, MD  pyridOXINE (VITAMIN B-6) 100 MG tablet Take 100 mg by mouth daily.     Yes [provider]  vitamin C (ASCORBIC ACID) 500 MG tablet Take 500 mg by mouth at bedtime.     Yes [provider]     No Known Allergies   Family History  Problem Relation Age of Onset  . Hyperlipidemia Father   . Hypertension Father   . Heart disease Father   . Alcohol abuse Father      Social History   Socioeconomic History  . Marital status: Married    Spouse name: Not on file  . Number of children: Not on file  . Years of education: Not on file  . Highest education level: Not on file  Occupational History  . Not on file  Social Needs  . Financial resource strain: Not on file  . Food insecurity:    Worry: Not on file    Inability: Not on file  . Transportation needs:    Medical: Not on file    Non-medical: Not on file  Tobacco Use  . Smoking status: Never Smoker  . Smokeless tobacco: Former Neurosurgeon    Types: Chew  Substance and Sexual Activity  . Alcohol use: No  . Drug use: No    Comment: Used "cocaine" as young adult  . Sexual activity: Not on file  Lifestyle  . Physical activity:    Days per week: Not on file    Minutes per session: Not on file  . Stress: Not on file  Relationships  . Social connections:    Talks on phone: Not on file    Gets together: Not on file    Attends religious service: Not on file    Active member of club or organization: Not on file    Attends meetings of clubs or organizations: Not on file    Relationship status: Not on file  . Intimate partner violence:    Fear of current or ex partner: Not on file    Emotionally abused: Not on file    Physically abused: Not on file    Forced sexual activity: Not on file  Other Topics Concern  . Not on file  Social History Narrative  . Not on file     Review of Systems  Constitutional: Negative.   HENT: Negative.   Eyes:  Negative.   Respiratory: Negative.   Cardiovascular: Negative.   Gastrointestinal: Negative.   Endocrine: Negative.   Genitourinary: Negative.   Musculoskeletal: Negative.   Skin: Negative.   Allergic/Immunologic: Negative.   Neurological: Negative.   Hematological: Negative.   Psychiatric/Behavioral: Negative.   All other systems reviewed and  are negative.      Objective:    Vitals:   03/30/18 0912  BP: 138/72  Pulse: 68  Resp: 14  Temp: 97.9 F (36.6 C)  TempSrc: Oral  SpO2: 96%  Weight: 216 lb (98 kg)  Height: 5\' 7"  (1.702 m)       Physical Exam  Constitutional: He appears well-developed.  HENT:  Head: Normocephalic and atraumatic.  Nose: Nose normal.  Eyes: Conjunctivae are normal. Right eye exhibits no discharge. Left eye exhibits no discharge.  Neck: No tracheal deviation present.  Cardiovascular: Normal rate and regular rhythm.  Pulmonary/Chest: Effort normal. No stridor. No respiratory distress.  Musculoskeletal: Normal range of motion.  Neurological: He is alert. He exhibits normal muscle tone. Coordination normal.  Skin: Skin is warm and dry. No rash noted.  Multiple thickened yellowed toe nails, most thickening to edges 2nd left toe, nail removed, nailbed with 1x33mm area of scabbing to lateral medial corner, otherwise all skin and nailbed is pink to flesh-colored, no purulent drainage, no tenderness to palpation, normal capillary refill  Psychiatric: He has a normal mood and affect. His behavior is normal.  Nursing note and vitals reviewed.        Assessment & Plan:      ICD-10-CM   1. Nail avulsion of toe, subsequent encounter S91.209D Ambulatory referral to Podiatry  2. Onychomycosis of multiple toenails with type 2 diabetes mellitus (HCC) E11.69 Ambulatory referral to Podiatry   B35.1     Patient with recheck after a left second toenail was removed after traumatic partial avulsion and likely that had fungal infection.  No signs of infection,  patient healing well.  Have instructed him to apply Vaseline as needed and continue to bandage with nonadherent gauze or Band-Aids.  He will follow-up with podiatry for evaluation of possible diffuse fungal infection to toes in the setting of type 2 diabetes.  Podiatry evaluation and treatment would be helpful for the patient with his thickened toenails to help trim and advised on how to care for feet with his history of type 2 diabetes.    Danelle Berry, PA-C 03/30/18 5:59 PM

## 2018-04-13 ENCOUNTER — Other Ambulatory Visit: Payer: Self-pay | Admitting: Family Medicine

## 2018-05-09 ENCOUNTER — Other Ambulatory Visit: Payer: Self-pay | Admitting: Family Medicine

## 2018-05-19 ENCOUNTER — Other Ambulatory Visit: Payer: Self-pay | Admitting: Family Medicine

## 2018-06-09 ENCOUNTER — Other Ambulatory Visit: Payer: Self-pay | Admitting: Family Medicine

## 2018-06-17 ENCOUNTER — Telehealth: Payer: Self-pay | Admitting: Physician Assistant

## 2018-06-17 ENCOUNTER — Other Ambulatory Visit: Payer: Self-pay

## 2018-06-17 MED ORDER — PIOGLITAZONE HCL 30 MG PO TABS: 30.0000 mg | ORAL_TABLET | Freq: Every day | ORAL | 0 refills | Status: DC

## 2018-06-17 MED ORDER — EMPAGLIFLOZIN 25 MG PO TABS: 25.0000 mg | ORAL_TABLET | Freq: Every day | ORAL | 0 refills | Status: DC

## 2018-06-17 NOTE — Telephone Encounter (Signed)
[facility] on [facility]

## 2018-06-17 NOTE — Telephone Encounter (Signed)
Medication sent into pharmacy. Patient notified.  

## 2018-06-22 ENCOUNTER — Other Ambulatory Visit: Payer: 59

## 2018-06-22 DIAGNOSIS — I10 Essential (primary) hypertension: Secondary | ICD-10-CM

## 2018-06-22 DIAGNOSIS — Z79899 Other long term (current) drug therapy: Secondary | ICD-10-CM

## 2018-06-22 DIAGNOSIS — E119 Type 2 diabetes mellitus without complications: Secondary | ICD-10-CM

## 2018-06-23 LAB — COMPREHENSIVE METABOLIC PANEL
AG Ratio: 1.9 (calc) (ref 1.0–2.5)
ALBUMIN MSPROF: 4.7 g/dL (ref 3.6–5.1)
ALT: 26 U/L (ref 9–46)
AST: 21 U/L (ref 10–35)
Alkaline phosphatase (APISO): 70 U/L (ref 40–115)
BUN: 15 mg/dL (ref 7–25)
CO2: 28 mmol/L (ref 20–32)
CREATININE: 0.86 mg/dL (ref 0.70–1.33)
Calcium: 10.1 mg/dL (ref 8.6–10.3)
Chloride: 104 mmol/L (ref 98–110)
GLOBULIN: 2.5 g/dL (ref 1.9–3.7)
Glucose, Bld: 133 mg/dL — ABNORMAL HIGH (ref 65–99)
Potassium: 4.3 mmol/L (ref 3.5–5.3)
Sodium: 142 mmol/L (ref 135–146)
Total Bilirubin: 1 mg/dL (ref 0.2–1.2)
Total Protein: 7.2 g/dL (ref 6.1–8.1)

## 2018-06-23 LAB — CBC WITH DIFFERENTIAL/PLATELET
Absolute Monocytes: 488 cells/uL (ref 200–950)
BASOS PCT: 0.6 %
Basophils Absolute: 32 cells/uL (ref 0–200)
Eosinophils Absolute: 212 cells/uL (ref 15–500)
Eosinophils Relative: 4 %
HCT: 46.4 % (ref 38.5–50.0)
Hemoglobin: 15.3 g/dL (ref 13.2–17.1)
Lymphs Abs: 1982 cells/uL (ref 850–3900)
MCH: 30.9 pg (ref 27.0–33.0)
MCHC: 33 g/dL (ref 32.0–36.0)
MCV: 93.7 fL (ref 80.0–100.0)
MPV: 9.8 fL (ref 7.5–12.5)
Monocytes Relative: 9.2 %
Neutro Abs: 2586 cells/uL (ref 1500–7800)
Neutrophils Relative %: 48.8 %
PLATELETS: 154 10*3/uL (ref 140–400)
RBC: 4.95 10*6/uL (ref 4.20–5.80)
RDW: 12.5 % (ref 11.0–15.0)
Total Lymphocyte: 37.4 %
WBC: 5.3 10*3/uL (ref 3.8–10.8)

## 2018-06-23 LAB — LIPID PANEL
CHOLESTEROL: 120 mg/dL (ref <200)
HDL: 53 mg/dL (ref >40)
LDL Cholesterol (Calc): 52 mg/dL (calc)
Non-HDL Cholesterol (Calc): 67 mg/dL (calc) (ref <130)
Total CHOL/HDL Ratio: 2.3 (calc) (ref <5.0)
Triglycerides: 69 mg/dL (ref <150)

## 2018-06-23 LAB — HEMOGLOBIN A1C
Hgb A1c MFr Bld: 7.5 % of total Hgb — ABNORMAL HIGH (ref <5.7)
Mean Plasma Glucose: 169 (calc)
eAG (mmol/L): 9.3 (calc)

## 2018-07-01 ENCOUNTER — Encounter: Payer: Self-pay | Admitting: Family Medicine

## 2018-07-01 ENCOUNTER — Ambulatory Visit: Payer: 59 | Admitting: Family Medicine

## 2018-07-01 VITALS — BP 120/68 | HR 78 | Temp 98.0°F | Resp 16 | Ht 67.0 in | Wt 216.0 lb

## 2018-07-01 DIAGNOSIS — I10 Essential (primary) hypertension: Secondary | ICD-10-CM

## 2018-07-01 DIAGNOSIS — E119 Type 2 diabetes mellitus without complications: Secondary | ICD-10-CM | POA: Diagnosis not present

## 2018-07-01 DIAGNOSIS — E785 Hyperlipidemia, unspecified: Secondary | ICD-10-CM | POA: Diagnosis not present

## 2018-07-01 NOTE — Progress Notes (Signed)
Subjective:    Patient ID: Kyle Ray, male    DOB: 25-Mar-1961, 58 y.o.   MRN: [id number]  Medication Refill    Patient is here today for follow-up of his diabetes mellitus type 2, hypertension, and hyperlipidemia.  His most recent lab work is listed below.  Labs actually look pretty good except for his hemoglobin A1c of 7.5.  Currently on metformin, Actos, and Jardiance. Lab on 06/22/2018  Component Date Value Ref Range Status  . WBC 06/22/2018 5.3  3.8 - 10.8 Thousand/uL Final  . RBC 06/22/2018 4.95  4.20 - 5.80 Million/uL Final  . Hemoglobin 06/22/2018 15.3  13.2 - 17.1 g/dL Final  . HCT 08/65/784601/29/2020 46.4  38.5 - 50.0 % Final  . MCV 06/22/2018 93.7  80.0 - 100.0 fL Final  . MCH 06/22/2018 30.9  27.0 - 33.0 pg Final  . MCHC 06/22/2018 33.0  32.0 - 36.0 g/dL Final  . RDW 96/29/528401/29/2020 12.5  11.0 - 15.0 % Final  . Platelets 06/22/2018 154  140 - 400 Thousand/uL Final  . MPV 06/22/2018 9.8  7.5 - 12.5 fL Final  . Neutro Abs 06/22/2018 2,586  1,500 - 7,800 cells/uL Final  . Lymphs Abs 06/22/2018 1,982  850 - 3,900 cells/uL Final  . Absolute Monocytes 06/22/2018 488  200 - 950 cells/uL Final  . Eosinophils Absolute 06/22/2018 212  15 - 500 cells/uL Final  . Basophils Absolute 06/22/2018 32  0 - 200 cells/uL Final  . Neutrophils Relative % 06/22/2018 48.8  % Final  . Total Lymphocyte 06/22/2018 37.4  % Final  . Monocytes Relative 06/22/2018 9.2  % Final  . Eosinophils Relative 06/22/2018 4.0  % Final  . Basophils Relative 06/22/2018 0.6  % Final  . Glucose, Bld 06/22/2018 133* 65 - 99 mg/dL Final   Comment: .            Fasting reference interval . For someone without known diabetes, a glucose value >125 mg/dL indicates that they may have diabetes and this should be confirmed with a follow-up test. .   . BUN 06/22/2018 15  7 - 25 mg/dL Final  . Creat 13/24/401001/29/2020 0.86  0.70 - 1.33 mg/dL Final   Comment: For patients >58 years of age, the reference limit for Creatinine is  approximately 13% higher for people identified as African-American. .   Edwena Felty. BUN/Creatinine Ratio 06/22/2018 NOT APPLICABLE  6 - 22 (calc) Final  . Sodium 06/22/2018 142  135 - 146 mmol/L Final  . Potassium 06/22/2018 4.3  3.5 - 5.3 mmol/L Final  . Chloride 06/22/2018 104  98 - 110 mmol/L Final  . CO2 06/22/2018 28  20 - 32 mmol/L Final  . Calcium 06/22/2018 10.1  8.6 - 10.3 mg/dL Final  . Total Protein 06/22/2018 7.2  6.1 - 8.1 g/dL Final  . Albumin 27/25/366401/29/2020 4.7  3.6 - 5.1 g/dL Final  . Globulin 40/34/742501/29/2020 2.5  1.9 - 3.7 g/dL (calc) Final  . AG Ratio 06/22/2018 1.9  1.0 - 2.5 (calc) Final  . Total Bilirubin 06/22/2018 1.0  0.2 - 1.2 mg/dL Final  . Alkaline phosphatase (APISO) 06/22/2018 70  40 - 115 U/L Final  . AST 06/22/2018 21  10 - 35 U/L Final  . ALT 06/22/2018 26  9 - 46 U/L Final  . Hgb A1c MFr Bld 06/22/2018 7.5* <5.7 % of total Hgb Final   Comment: For someone without known diabetes, a hemoglobin A1c value of 6.5% or greater indicates that they may have  diabetes and this should be confirmed with a follow-up  test. . For someone with known diabetes, a value <7% indicates  that their diabetes is well controlled and a value  greater than or equal to 7% indicates suboptimal  control. A1c targets should be individualized based on  duration of diabetes, age, comorbid conditions, and  other considerations. . Currently, no consensus exists regarding use of hemoglobin A1c for diagnosis of diabetes for children. .   . Mean Plasma Glucose 06/22/2018 169  (calc) Final  . eAG (mmol/L) 06/22/2018 9.3  (calc) Final  . Cholesterol 06/22/2018 120  <200 mg/dL Final  . HDL 84/69/629501/29/2020 53  >40 mg/dL Final  . Triglycerides 06/22/2018 69  <150 mg/dL Final  . LDL Cholesterol (Calc) 06/22/2018 52  mg/dL (calc) Final   Comment: Reference range: <100 . Desirable range <100 mg/dL for primary prevention;   <70 mg/dL for patients with CHD or diabetic patients  with > or = 2 CHD risk  factors. Marland Kitchen. LDL-C is now calculated using the Martin-Hopkins  calculation, which is a validated novel method providing  better accuracy than the Friedewald equation in the  estimation of LDL-C.  Horald PollenMartin SS et al. Lenox AhrJAMA. 2841;324(402013;310(19): 2061-2068  (http://education.[facility].com/faq/FAQ164)   . Total CHOL/HDL Ratio 06/22/2018 2.3  <1.0<5.0 (calc) Final  . Non-HDL Cholesterol (Calc) 06/22/2018 67  <130 mg/dL (calc) Final   Comment: For patients with diabetes plus 1 major ASCVD risk  factor, treating to a non-HDL-C goal of <100 mg/dL  (LDL-C of <27<70 mg/dL) is considered a therapeutic  option.     Patient admits he has been eating a lot of carbohydrates.  He is not exercising.  We discussed resuming Januvia in addition to his other medication however he states that his lifestyle recently has been very poor.  He has been eating junk food and he has been very sedentary.  He denies any chest pain shortness of breath or dyspnea on exertion.  He denies any myalgias or right upper quadrant pain. Past Medical History:  Diagnosis Date  . Allergy    allergic rhinitis  . Diabetes mellitus   . Elevated LFTs   . Hyperlipidemia   . Hypertension   . Obesity    No past surgical history on file. Current Outpatient Medications on File Prior to Visit  Medication Sig Dispense Refill  . aspirin 81 MG tablet Take 81 mg by mouth at bedtime.     Marland Kitchen. atorvastatin (LIPITOR) 20 MG tablet Take 1 tablet (20 mg total) by mouth daily. 90 tablet 3  . cholecalciferol (VITAMIN D) 1000 UNITS tablet Take 1,000 Units by mouth at bedtime.      Marland Kitchen. Cod Liver Oil 1000 MG CAPS Take 1 capsule by mouth daily.    . cyclobenzaprine (FLEXERIL) 10 MG tablet Take 1 tablet (10 mg total) by mouth 3 (three) times daily as needed for muscle spasms. 30 tablet 0  . empagliflozin (JARDIANCE) 25 MG TABS tablet Take 25 mg by mouth daily. 30 tablet 0  . fish oil-omega-3 fatty acids 1000 MG capsule Take 1 g by mouth 2 (two) times daily. 1 in am,  2 in pm    . glucose blood test strip Check BS bid and prn  - Pt uses one touch ultra meter 100 each 12  . losartan (COZAAR) 50 MG tablet TAKE ONE TABLET BY MOUTH ONCE DAILY. 90 tablet 3  . meloxicam (MOBIC) 15 MG tablet Take 1 tablet (15 mg total) by mouth daily. 30 tablet 1  .  metFORMIN (GLUCOPHAGE) 1000 MG tablet TAKE 1 TABLET BY MOUTH TWICE DAILY 180 tablet 1  . Multiple Vitamins-Minerals (MULTIVITAMIN PO) Take 1 tablet by mouth daily.    . pioglitazone (ACTOS) 30 MG tablet Take 1 tablet (30 mg total) by mouth daily. 30 tablet 0  . pyridOXINE (VITAMIN B-6) 100 MG tablet Take 100 mg by mouth daily.      . vitamin C (ASCORBIC ACID) 500 MG tablet Take 500 mg by mouth at bedtime.       No current facility-administered medications on file prior to visit.     No Known Allergies Social History   Socioeconomic History  . Marital status: Married    Spouse name: Not on file  . Number of children: Not on file  . Years of education: Not on file  . Highest education level: Not on file  Occupational History  . Not on file  Social Needs  . Financial resource strain: Not on file  . Food insecurity:    Worry: Not on file    Inability: Not on file  . Transportation needs:    Medical: Not on file    Non-medical: Not on file  Tobacco Use  . Smoking status: Never Smoker  . Smokeless tobacco: Former Neurosurgeon    Types: Chew  Substance and Sexual Activity  . Alcohol use: No  . Drug use: No    Comment: Used "cocaine" as young adult  . Sexual activity: Not on file  Lifestyle  . Physical activity:    Days per week: Not on file    Minutes per session: Not on file  . Stress: Not on file  Relationships  . Social connections:    Talks on phone: Not on file    Gets together: Not on file    Attends religious service: Not on file    Active member of club or organization: Not on file    Attends meetings of clubs or organizations: Not on file    Relationship status: Not on file  . Intimate partner  violence:    Fear of current or ex partner: Not on file    Emotionally abused: Not on file    Physically abused: Not on file    Forced sexual activity: Not on file  Other Topics Concern  . Not on file  Social History Narrative  . Not on file   Family History  Problem Relation Age of Onset  . Hyperlipidemia Father   . Hypertension Father   . Heart disease Father   . Alcohol abuse Father       Review of Systems  All other systems reviewed and are negative.      Objective:   Physical Exam  Constitutional: He is oriented to person, place, and time. He appears well-developed and well-nourished. No distress.  HENT:  Head: Normocephalic and atraumatic.  Right Ear: External ear normal.  Left Ear: External ear normal.  Nose: Nose normal.  Mouth/Throat: Oropharynx is clear and moist.  Eyes: Pupils are equal, round, and reactive to light. Conjunctivae and EOM are normal. Right eye exhibits no discharge. Left eye exhibits no discharge. No scleral icterus.  Neck: Normal range of motion. Neck supple. No JVD present. No tracheal deviation present. No thyromegaly present.  Cardiovascular: Normal rate, regular rhythm, normal heart sounds and intact distal pulses. Exam reveals no gallop and no friction rub.  No murmur heard. Pulmonary/Chest: Effort normal and breath sounds normal. No stridor. No respiratory distress. He has no wheezes.  He has no rales.  Abdominal: Soft. Bowel sounds are normal. He exhibits no distension and no mass. There is no abdominal tenderness. There is no rebound and no guarding.  Genitourinary:    Prostate and rectum normal.  Rectum:     Guaiac result negative.  No penile tenderness.  Musculoskeletal: Normal range of motion.        General: No tenderness or edema.  Lymphadenopathy:    He has no cervical adenopathy.  Neurological: He is alert and oriented to person, place, and time. He has normal reflexes. No cranial nerve deficit. He exhibits normal muscle tone.  Coordination normal.  Skin: Skin is warm and dry. No rash noted. He is not diaphoretic. No erythema. No pallor.  Psychiatric: He has a normal mood and affect. His behavior is normal. Judgment and thought content normal.  Vitals reviewed.         Assessment & Plan:  Type 2 diabetes mellitus without complication, without long-term current use of insulin (HCC)  Essential hypertension  Hyperlipidemia, unspecified hyperlipidemia type  Patient's blood pressure is excellent.  His cholesterol is good.  However his diabetes is not at goal.  I want his A1c to be less than 6.5.  We discussed resuming Januvia but the patient would like to try 3 to 4 months of aggressive lifestyle changes including 30 minutes a day 5 days a week of exercise and decreasing the carbohydrates in his diet and then rechecking his lab work prior to adding any additional medication

## 2018-07-12 ENCOUNTER — Other Ambulatory Visit: Payer: Self-pay | Admitting: Family Medicine

## 2018-07-13 ENCOUNTER — Telehealth: Payer: Self-pay | Admitting: Family Medicine

## 2018-07-13 MED ORDER — EMPAGLIFLOZIN 25 MG PO TABS: 25.0000 mg | ORAL_TABLET | Freq: Every day | ORAL | 5 refills | Status: DC

## 2018-07-13 NOTE — Telephone Encounter (Signed)
Pt wants jardiance called in to [facility] and would like to get it with refills so he doesn't have to call every month for refill request.

## 2018-07-13 NOTE — Telephone Encounter (Signed)
Medication called/sent to requested pharmacy  

## 2018-07-24 ENCOUNTER — Other Ambulatory Visit: Payer: Self-pay | Admitting: Family Medicine

## 2018-08-23 ENCOUNTER — Encounter: Payer: Self-pay | Admitting: Family Medicine

## 2018-08-23 ENCOUNTER — Ambulatory Visit: Payer: 59 | Admitting: Family Medicine

## 2018-08-23 ENCOUNTER — Other Ambulatory Visit: Payer: Self-pay

## 2018-08-23 VITALS — BP 110/62 | HR 80 | Temp 98.2°F | Resp 18 | Ht 67.0 in | Wt 213.0 lb

## 2018-08-23 DIAGNOSIS — J019 Acute sinusitis, unspecified: Secondary | ICD-10-CM | POA: Diagnosis not present

## 2018-08-23 DIAGNOSIS — B9689 Other specified bacterial agents as the cause of diseases classified elsewhere: Secondary | ICD-10-CM | POA: Diagnosis not present

## 2018-08-23 MED ORDER — AMOXICILLIN 875 MG PO TABS: 875.0000 mg | ORAL_TABLET | Freq: Two times a day (BID) | ORAL | 0 refills | Status: DC

## 2018-08-23 MED ORDER — FLUTICASONE PROPIONATE 50 MCG/ACT NA SUSP: 2.0000 | Freq: Every day | NASAL | 6 refills | Status: DC

## 2018-08-23 NOTE — Progress Notes (Signed)
Subjective:    Patient ID: Kyle Ray, male    DOB: 04-09-1961, 58 y.o.   MRN: [id number]  Patient has been battling his sinuses for the last 2 weeks.  He reports constant nasal congestion.  He reports rhinorrhea and postnasal drip.  He has been taking an over-the-counter allergy medicine without any relief.  He has now developed pressure in both maxillary sinuses as well as a dull constant sinus headache in his forehead.  He is also having a tension-like headache in his right occiput that comes and goes.  He denies any cough.  He denies any chest pain.  He denies any shortness of breath.  He does have a sore scratchy throat due to postnasal drip.  He denies any high fever although he does report subjective fevers and body aches. Past Medical History:  Diagnosis Date  . Allergy    allergic rhinitis  . Diabetes mellitus   . Elevated LFTs   . Hyperlipidemia   . Hypertension   . Obesity    No past surgical history on file. Current Outpatient Medications on File Prior to Visit  Medication Sig Dispense Refill  . aspirin 81 MG tablet Take 81 mg by mouth at bedtime.     Marland Kitchen atorvastatin (LIPITOR) 20 MG tablet Take 1 tablet (20 mg total) by mouth daily. 90 tablet 3  . cholecalciferol (VITAMIN D) 1000 UNITS tablet Take 1,000 Units by mouth at bedtime.      Marland Kitchen Cod Liver Oil 1000 MG CAPS Take 1 capsule by mouth daily.    . cyclobenzaprine (FLEXERIL) 10 MG tablet Take 1 tablet (10 mg total) by mouth 3 (three) times daily as needed for muscle spasms. 30 tablet 0  . empagliflozin (JARDIANCE) 25 MG TABS tablet Take 25 mg by mouth daily. 30 tablet 5  . fish oil-omega-3 fatty acids 1000 MG capsule Take 1 g by mouth 2 (two) times daily. 1 in am, 2 in pm    . glucose blood test strip Check BS bid and prn  - Pt uses one touch ultra meter 100 each 12  . losartan (COZAAR) 50 MG tablet TAKE ONE TABLET BY MOUTH ONCE DAILY. 90 tablet 3  . meloxicam (MOBIC) 15 MG tablet Take 1 tablet (15 mg total) by mouth  daily. 30 tablet 1  . metFORMIN (GLUCOPHAGE) 1000 MG tablet TAKE 1 TABLET BY MOUTH TWICE DAILY 180 tablet 0  . Multiple Vitamins-Minerals (MULTIVITAMIN PO) Take 1 tablet by mouth daily.    . pioglitazone (ACTOS) 30 MG tablet TAKE 1 TABLET EVERY DAY. 90 tablet 1  . pyridOXINE (VITAMIN B-6) 100 MG tablet Take 100 mg by mouth daily.      . vitamin C (ASCORBIC ACID) 500 MG tablet Take 500 mg by mouth at bedtime.       No current facility-administered medications on file prior to visit.     No Known Allergies Social History   Socioeconomic History  . Marital status: Married    Spouse name: Not on file  . Number of children: Not on file  . Years of education: Not on file  . Highest education level: Not on file  Occupational History  . Not on file  Social Needs  . Financial resource strain: Not on file  . Food insecurity:    Worry: Not on file    Inability: Not on file  . Transportation needs:    Medical: Not on file    Non-medical: Not on file  Tobacco Use  .  Smoking status: Never Smoker  . Smokeless tobacco: Former Neurosurgeon    Types: Chew  Substance and Sexual Activity  . Alcohol use: No  . Drug use: No    Comment: Used "cocaine" as young adult  . Sexual activity: Not on file  Lifestyle  . Physical activity:    Days per week: Not on file    Minutes per session: Not on file  . Stress: Not on file  Relationships  . Social connections:    Talks on phone: Not on file    Gets together: Not on file    Attends religious service: Not on file    Active member of club or organization: Not on file    Attends meetings of clubs or organizations: Not on file    Relationship status: Not on file  . Intimate partner violence:    Fear of current or ex partner: Not on file    Emotionally abused: Not on file    Physically abused: Not on file    Forced sexual activity: Not on file  Other Topics Concern  . Not on file  Social History Narrative  . Not on file   Family History  Problem  Relation Age of Onset  . Hyperlipidemia Father   . Hypertension Father   . Heart disease Father   . Alcohol abuse Father       Review of Systems  All other systems reviewed and are negative.      Objective:   Physical Exam  Constitutional: He is oriented to person, place, and time. He appears well-developed and well-nourished. No distress.  HENT:  Head: Normocephalic and atraumatic.  Right Ear: External ear normal.  Left Ear: External ear normal.  Nose: Mucosal edema and rhinorrhea present. Right sinus exhibits maxillary sinus tenderness. Left sinus exhibits maxillary sinus tenderness.  Mouth/Throat: Oropharynx is clear and moist. No oropharyngeal exudate.  Eyes: Pupils are equal, round, and reactive to light. Conjunctivae and EOM are normal. Right eye exhibits no discharge. Left eye exhibits no discharge. No scleral icterus.  Neck: Normal range of motion. Neck supple.  Cardiovascular: Normal rate, regular rhythm, normal heart sounds and intact distal pulses. Exam reveals no gallop and no friction rub.  No murmur heard. Pulmonary/Chest: Effort normal and breath sounds normal. No stridor. No respiratory distress. He has no wheezes. He has no rales.  Lymphadenopathy:    He has no cervical adenopathy.  Neurological: He is alert and oriented to person, place, and time. No cranial nerve deficit. Coordination normal.  Skin: He is not diaphoretic.  Vitals reviewed.         Assessment & Plan:  Patient appears to have a sinus infection and a tension headache.  I will treat his sinus infection with amoxicillin 875 mg p.o. twice daily for 10 days.  He can use Flonase 2 sprays each nostril daily.  I will treat his tension headache with ibuprofen 800 mg every 8 hours as needed.  Reassess in 4 to 5 days if no better or sooner if getting worse.

## 2018-10-18 ENCOUNTER — Other Ambulatory Visit: Payer: Self-pay | Admitting: Family Medicine

## 2018-11-24 ENCOUNTER — Other Ambulatory Visit: Payer: PRIVATE HEALTH INSURANCE

## 2018-11-24 ENCOUNTER — Other Ambulatory Visit: Payer: Self-pay

## 2018-11-24 DIAGNOSIS — Z20822 Contact with and (suspected) exposure to covid-19: Secondary | ICD-10-CM

## 2018-11-29 ENCOUNTER — Emergency Department (HOSPITAL_COMMUNITY)
Admission: EM | Admit: 2018-11-29 | Discharge: 2018-11-29 | Disposition: A | Discharge status: Home / Self Care | Payer: 59 | Attending: Emergency Medicine | Admitting: Emergency Medicine

## 2018-11-29 ENCOUNTER — Telehealth: Payer: Self-pay | Admitting: Family Medicine

## 2018-11-29 ENCOUNTER — Emergency Department (HOSPITAL_COMMUNITY): Payer: 59

## 2018-11-29 ENCOUNTER — Other Ambulatory Visit: Payer: Self-pay

## 2018-11-29 ENCOUNTER — Encounter (HOSPITAL_COMMUNITY): Payer: Self-pay | Admitting: Emergency Medicine

## 2018-11-29 DIAGNOSIS — Z7982 Long term (current) use of aspirin: Secondary | ICD-10-CM | POA: Diagnosis not present

## 2018-11-29 DIAGNOSIS — R0789 Other chest pain: Secondary | ICD-10-CM | POA: Insufficient documentation

## 2018-11-29 DIAGNOSIS — Z79899 Other long term (current) drug therapy: Secondary | ICD-10-CM | POA: Diagnosis not present

## 2018-11-29 DIAGNOSIS — Z7984 Long term (current) use of oral hypoglycemic drugs: Secondary | ICD-10-CM | POA: Diagnosis not present

## 2018-11-29 DIAGNOSIS — E785 Hyperlipidemia, unspecified: Secondary | ICD-10-CM | POA: Diagnosis not present

## 2018-11-29 DIAGNOSIS — I10 Essential (primary) hypertension: Secondary | ICD-10-CM | POA: Diagnosis not present

## 2018-11-29 DIAGNOSIS — E119 Type 2 diabetes mellitus without complications: Secondary | ICD-10-CM | POA: Diagnosis not present

## 2018-11-29 LAB — TROPONIN I (HIGH SENSITIVITY)
Troponin I (High Sensitivity): 2 ng/L (ref <18)
Troponin I (High Sensitivity): <2 ng/L (ref <18)

## 2018-11-29 LAB — BASIC METABOLIC PANEL
Anion gap: 10 (ref 5–15)
BUN: 17 mg/dL (ref 6–20)
CO2: 25 mmol/L (ref 22–32)
Calcium: 9.7 mg/dL (ref 8.9–10.3)
Chloride: 104 mmol/L (ref 98–111)
Creatinine, Ser: 0.84 mg/dL (ref 0.61–1.24)
GFR calc Af Amer: >60 mL/min (ref >60)
GFR calc non Af Amer: >60 mL/min (ref >60)
Glucose, Bld: 177 mg/dL — ABNORMAL HIGH (ref 70–99)
Potassium: 3.8 mmol/L (ref 3.5–5.1)
Sodium: 139 mmol/L (ref 135–145)

## 2018-11-29 LAB — CBC
HCT: 44.1 % (ref 39.0–52.0)
Hemoglobin: 14.3 g/dL (ref 13.0–17.0)
MCH: 31.2 pg (ref 26.0–34.0)
MCHC: 32.4 g/dL (ref 30.0–36.0)
MCV: 96.3 fL (ref 80.0–100.0)
Platelets: 159 10*3/uL (ref 150–400)
RBC: 4.58 MIL/uL (ref 4.22–5.81)
RDW: 13.2 % (ref 11.5–15.5)
WBC: 6.6 10*3/uL (ref 4.0–10.5)
nRBC: 0 % (ref 0.0–0.2)

## 2018-11-29 MED ORDER — CYCLOBENZAPRINE HCL 5 MG PO TABS: 5.0000 mg | ORAL_TABLET | Freq: Three times a day (TID) | ORAL | 0 refills | Status: DC | PRN

## 2018-11-29 MED ORDER — IBUPROFEN 600 MG PO TABS: 600.0000 mg | ORAL_TABLET | Freq: Four times a day (QID) | ORAL | 0 refills | Status: AC | PRN

## 2018-11-29 NOTE — ED Notes (Signed)
Pt. Refused to be put on cardiac monitor states "there is no reason to be put on it I don't want too".

## 2018-11-29 NOTE — Discharge Instructions (Addendum)
Your labs, ekg and chest xray are all normal tonight.  Your exam suggests that your pain is chest wall pain, possibly triggered from picking up these cat litter packages at the store today, especially since it is reproducible with twisting and palpation.  You may take the medicines prescribed to help with your symptoms.  A heating pad may also be helpful 20 minutes several times daily.

## 2018-11-29 NOTE — ED Notes (Signed)
ED Provider at bedside. 

## 2018-11-29 NOTE — ED Triage Notes (Signed)
Pain in center of chest.  Worse with movement.  Denies any injury

## 2018-11-29 NOTE — Telephone Encounter (Signed)
Patient called into the office and stated that he is experiencing tightness in the center of his chest with fatigue. States symptoms started while he was walking around [place] and have been constant since the onset. I advised patient to go to an urgent care or ER to be seen today. Patient verbalized understanding.

## 2018-11-30 LAB — NOVEL CORONAVIRUS, NAA: SARS-CoV-2, NAA: NOT DETECTED

## 2018-11-30 NOTE — ED Provider Notes (Signed)
[facility] EMERGENCY DEPARTMENT Provider Note   CSN: [id number] Arrival date & time: 11/29/18  1519     History   Chief Complaint Chief Complaint  Patient presents with  . Chest Pain    HPI Kyle Ray is a 58 y.o. male.  HPI: A 58 year old patient with a history of treated diabetes, hypertension, hypercholesterolemia and obesity presents for evaluation of chest pain. Initial onset of pain was approximately 3-6 hours ago. The patient's chest pain is sharp and is not worse with exertion. The patient's chest pain is middle- or left-sided, is not well-localized, is not described as heaviness/pressure/tightness and does not radiate to the arms/jaw/neck. The patient does not complain of nausea and denies diaphoresis. The patient has no history of stroke, has no history of peripheral artery disease, has not smoked in the past 90 days and has no relevant family history of coronary artery disease (first degree relative at less than age 65).   Pt describes onset of pain while pushing shopping cart at [place].  Had just lifted several heavy buckets of cat litter off an overhead shelf prior to onset.  His pain is worsened with twisting his torso and vaguely present with palpation of his chest.    The history is provided by the patient.  Chest Pain Associated symptoms: no abdominal pain, no diaphoresis, no dizziness, no fever, no headache, no nausea, no numbness, no palpitations, no shortness of breath, no vomiting and no weakness     Past Medical History:  Diagnosis Date  . Allergy    allergic rhinitis  . Diabetes mellitus   . Elevated LFTs   . Hyperlipidemia   . Hypertension   . Obesity     Patient Active Problem List   Diagnosis Date Noted  . Pain in joint, shoulder region 11/15/2013  . Elevated LFTs 01/14/2012  . DIABETES MELLITUS, TYPE II, UNCONTROLLED 11/28/2008  . HYPERLIPIDEMIA 03/26/2006  . OBESITY NOS 03/26/2006  . Essential hypertension 03/26/2006  . ALLERGIC  RHINITIS 03/26/2006    History reviewed. No pertinent surgical history.      Home Medications    Prior to Admission medications   Medication Sig Start Date End Date Taking? Authorizing Provider  amoxicillin (AMOXIL) 875 MG tablet Take 1 tablet (875 mg total) by mouth 2 (two) times daily. 08/23/18   Susy Frizzle, MD  aspirin 81 MG tablet Take 81 mg by mouth at bedtime.     [provider]  atorvastatin (LIPITOR) 20 MG tablet Take 1 tablet (20 mg total) by mouth daily. 12/13/17   Susy Frizzle, MD  cholecalciferol (VITAMIN D) 1000 UNITS tablet Take 1,000 Units by mouth at bedtime.      [provider]  Cod Liver Oil 1000 MG CAPS Take 1 capsule by mouth daily.    [provider]  cyclobenzaprine (FLEXERIL) 5 MG tablet Take 1 tablet (5 mg total) by mouth 3 (three) times daily as needed for muscle spasms. 11/29/18   Evalee Jefferson, PA-C  empagliflozin (JARDIANCE) 25 MG TABS tablet Take 25 mg by mouth daily. 07/13/18   Susy Frizzle, MD  fish oil-omega-3 fatty acids 1000 MG capsule Take 1 g by mouth 2 (two) times daily. 1 in am, 2 in pm    [provider]  fluticasone (FLONASE) 50 MCG/ACT nasal spray Place 2 sprays into both nostrils daily. 08/23/18   Susy Frizzle, MD  glucose blood test strip Check BS bid and prn  - Pt uses one touch  ultra meter 07/27/14   Donita BrooksPickard, Warren T, MD  ibuprofen (ADVIL) 600 MG tablet Take 1 tablet (600 mg total) by mouth every 6 (six) hours as needed. 11/29/18   Burgess AmorIdol, Netta Fodge, PA-C  losartan (COZAAR) 50 MG tablet TAKE ONE TABLET BY MOUTH ONCE DAILY. 02/22/18   Donita BrooksPickard, Warren T, MD  meloxicam (MOBIC) 15 MG tablet Take 1 tablet (15 mg total) by mouth daily. 11/04/17   Donita BrooksPickard, Warren T, MD  metFORMIN (GLUCOPHAGE) 1000 MG tablet Take 1 tablet by mouth twice daily 10/18/18   Donita BrooksPickard, Warren T, MD  Multiple Vitamins-Minerals (MULTIVITAMIN PO) Take 1 tablet by mouth daily.    [provider]  pioglitazone (ACTOS) 30 MG tablet  TAKE 1 TABLET EVERY DAY. 07/25/18   Donita BrooksPickard, Warren T, MD  pyridOXINE (VITAMIN B-6) 100 MG tablet Take 100 mg by mouth daily.      [provider]  vitamin C (ASCORBIC ACID) 500 MG tablet Take 500 mg by mouth at bedtime.      [provider]    Family History Family History  Problem Relation Age of Onset  . Hyperlipidemia Father   . Hypertension Father   . Heart disease Father   . Alcohol abuse Father     Social History Social History   Tobacco Use  . Smoking status: Never Smoker  . Smokeless tobacco: Former NeurosurgeonUser    Types: Chew  Substance Use Topics  . Alcohol use: No  . Drug use: No    Comment: Used "cocaine" as young adult     Allergies   Patient has no known allergies.   Review of Systems Review of Systems  Constitutional: Negative for chills, diaphoresis and fever.  HENT: Negative for congestion and sore throat.   Eyes: Negative.   Respiratory: Negative for chest tightness and shortness of breath.   Cardiovascular: Positive for chest pain. Negative for palpitations.  Gastrointestinal: Negative for abdominal pain, nausea and vomiting.  Genitourinary: Negative.   Musculoskeletal: Negative for arthralgias, joint swelling and neck pain.  Skin: Negative.  Negative for rash and wound.  Neurological: Negative for dizziness, weakness, light-headedness, numbness and headaches.  Psychiatric/Behavioral: Negative.      Physical Exam Updated Vital Signs BP (!) 146/74   Pulse 63   Temp 98.6 F (37 C) (Temporal)   Resp 18   Ht 5\' 9"  (1.753 m)   Wt 93 kg   SpO2 98%   BMI 30.27 kg/m   Physical Exam Vitals signs and nursing note reviewed.  Constitutional:      General: He is not in acute distress.    Appearance: He is well-developed.  HENT:     Head: Normocephalic and atraumatic.  Eyes:     Conjunctiva/sclera: Conjunctivae normal.  Neck:     Musculoskeletal: Normal range of motion.  Cardiovascular:     Rate and Rhythm: Normal rate and regular  rhythm.     Heart sounds: Normal heart sounds.  Pulmonary:     Effort: Pulmonary effort is normal.     Breath sounds: Normal breath sounds. No wheezing.  Abdominal:     General: Bowel sounds are normal.     Palpations: Abdomen is soft.     Tenderness: There is no abdominal tenderness.  Musculoskeletal: Normal range of motion.     Right lower leg: No edema.     Left lower leg: No edema.  Skin:    General: Skin is warm and dry.  Neurological:     Mental Status: He is  alert.      ED Treatments / Results  Labs (all labs ordered are listed, but only abnormal results are displayed) Labs Reviewed  BASIC METABOLIC PANEL - Abnormal; Notable for the following components:      Result Value   Glucose, Bld 177 (*)    All other components within normal limits  CBC  TROPONIN I (HIGH SENSITIVITY)  TROPONIN I (HIGH SENSITIVITY)    EKG EKG Interpretation  Date/Time:  Tuesday November 29 2018 15:34:33 EDT Ventricular Rate:  74 PR Interval:  170 QRS Duration: 82 QT Interval:  358 QTC Calculation: 397 R Axis:   77 Text Interpretation:  Normal sinus rhythm Normal ECG Confirmed by Kennis CarinaBero, Jayvian [phone]([id number]) on 11/29/2018 5:55:37 PM   Radiology Dg Chest 2 View  Result Date: 11/29/2018 CLINICAL DATA:  Centralized chest pain. EXAM: CHEST - 2 VIEW COMPARISON:  04/06/2011 FINDINGS: Cardiomediastinal silhouette is normal. Mediastinal contours appear intact. Calcific atherosclerotic disease of the aorta. There is no evidence of focal airspace consolidation, pleural effusion or pneumothorax. Osseous structures are without acute abnormality. Soft tissues are grossly normal. IMPRESSION: No active cardiopulmonary disease. Electronically Signed   By: Ted Mcalpineobrinka  Dimitrova M.D.   On: 11/29/2018 15:59    Procedures Procedures (including critical care time)  Medications Ordered in ED Medications - No data to display   Initial Impression / Assessment and Plan / ED Course  I have reviewed the triage vital  signs and the nursing notes.  Pertinent labs & imaging results that were available during my care of the patient were reviewed by me and considered in my medical decision making (see chart for details).     HEAR Score: 3  Pt with low risk heart score, ekg, cxr and labs reviewed, delta trops low and without change.  Pt sx free at time of dc, advised f/u with pcp (has appt in 3 days.)  Pain is   Final Clinical Impressions(s) / ED Diagnoses   Final diagnoses:  Chest wall pain    ED Discharge Orders         Ordered    cyclobenzaprine (FLEXERIL) 5 MG tablet  3 times daily PRN     11/29/18 1911    ibuprofen (ADVIL) 600 MG tablet  Every 6 hours PRN     11/29/18 1911           Burgess Amordol, Chantel Teti, Cordelia Poche-C 11/30/18 1245    Sabas SousBero, Vada M, MD 12/05/18 [phone]

## 2018-12-02 ENCOUNTER — Encounter: Payer: Self-pay | Admitting: Family Medicine

## 2018-12-02 ENCOUNTER — Other Ambulatory Visit: Payer: Self-pay

## 2018-12-02 ENCOUNTER — Ambulatory Visit: Payer: 59 | Admitting: Family Medicine

## 2018-12-02 VITALS — BP 110/60 | HR 77 | Temp 98.3°F | Resp 16 | Ht 67.0 in | Wt 207.0 lb

## 2018-12-02 DIAGNOSIS — M65342 Trigger finger, left ring finger: Secondary | ICD-10-CM

## 2018-12-02 NOTE — Progress Notes (Signed)
Subjective:    Patient ID: Kyle Ray, male    DOB: 1961/04/11, 58 y.o.   MRN: [id number]  HPI Patient requests a cortisone shot for a trigger finger.  The fourth MCP joint on his left hand has been locking whenever he makes a fist over last 3 weeks.  He also reports pain in the volar surface of the MCP joint.  He has full range of motion in the PIP and DIP joints however if he makes a fist and bend the MCP joint, it locks and he has to physically extend that joint.  I originally saw the patient in September of last year for similar symptoms and gave him a cortisone injection at that time which worked well.  Symptoms just started returning 3 weeks ago  Past Medical History:  Diagnosis Date  . Allergy    allergic rhinitis  . Diabetes mellitus   . Elevated LFTs   . Hyperlipidemia   . Hypertension   . Obesity    No past surgical history on file. Current Outpatient Medications on File Prior to Visit  Medication Sig Dispense Refill  . amoxicillin (AMOXIL) 875 MG tablet Take 1 tablet (875 mg total) by mouth 2 (two) times daily. 20 tablet 0  . aspirin 81 MG tablet Take 81 mg by mouth at bedtime.     Marland Kitchen atorvastatin (LIPITOR) 20 MG tablet Take 1 tablet (20 mg total) by mouth daily. 90 tablet 3  . cholecalciferol (VITAMIN D) 1000 UNITS tablet Take 1,000 Units by mouth at bedtime.      Marland Kitchen Cod Liver Oil 1000 MG CAPS Take 1 capsule by mouth daily.    . cyclobenzaprine (FLEXERIL) 5 MG tablet Take 1 tablet (5 mg total) by mouth 3 (three) times daily as needed for muscle spasms. 15 tablet 0  . empagliflozin (JARDIANCE) 25 MG TABS tablet Take 25 mg by mouth daily. 30 tablet 5  . fish oil-omega-3 fatty acids 1000 MG capsule Take 1 g by mouth 2 (two) times daily. 1 in am, 2 in pm    . fluticasone (FLONASE) 50 MCG/ACT nasal spray Place 2 sprays into both nostrils daily. 16 g 6  . glucose blood test strip Check BS bid and prn  - Pt uses one touch ultra meter 100 each 12  . ibuprofen (ADVIL) 600 MG  tablet Take 1 tablet (600 mg total) by mouth every 6 (six) hours as needed. 30 tablet 0  . losartan (COZAAR) 50 MG tablet TAKE ONE TABLET BY MOUTH ONCE DAILY. 90 tablet 3  . meloxicam (MOBIC) 15 MG tablet Take 1 tablet (15 mg total) by mouth daily. 30 tablet 1  . metFORMIN (GLUCOPHAGE) 1000 MG tablet Take 1 tablet by mouth twice daily 180 tablet 2  . Multiple Vitamins-Minerals (MULTIVITAMIN PO) Take 1 tablet by mouth daily.    . pioglitazone (ACTOS) 30 MG tablet TAKE 1 TABLET EVERY DAY. 90 tablet 1  . pyridOXINE (VITAMIN B-6) 100 MG tablet Take 100 mg by mouth daily.      . vitamin C (ASCORBIC ACID) 500 MG tablet Take 500 mg by mouth at bedtime.       No current facility-administered medications on file prior to visit.    No Known Allergies Social History   Socioeconomic History  . Marital status: Married    Spouse name: Not on file  . Number of children: Not on file  . Years of education: Not on file  . Highest education level: Not on file  Occupational History  . Not on file  Social Needs  . Financial resource strain: Not on file  . Food insecurity    Worry: Not on file    Inability: Not on file  . Transportation needs    Medical: Not on file    Non-medical: Not on file  Tobacco Use  . Smoking status: Never Smoker  . Smokeless tobacco: Former NeurosurgeonUser    Types: Chew  Substance and Sexual Activity  . Alcohol use: No  . Drug use: No    Comment: Used "cocaine" as young adult  . Sexual activity: Not on file  Lifestyle  . Physical activity    Days per week: Not on file    Minutes per session: Not on file  . Stress: Not on file  Relationships  . Social Musicianconnections    Talks on phone: Not on file    Gets together: Not on file    Attends religious service: Not on file    Active member of club or organization: Not on file    Attends meetings of clubs or organizations: Not on file    Relationship status: Not on file  . Intimate partner violence    Fear of current or ex  partner: Not on file    Emotionally abused: Not on file    Physically abused: Not on file    Forced sexual activity: Not on file  Other Topics Concern  . Not on file  Social History Narrative  . Not on file    Review of Systems  All other systems reviewed and are negative.      Objective:   Physical Exam Vitals signs reviewed.  Constitutional:      Appearance: He is well-developed.  Cardiovascular:     Rate and Rhythm: Normal rate and regular rhythm.  Pulmonary:     Effort: Pulmonary effort is normal.     Breath sounds: Normal breath sounds.  Musculoskeletal:     Left hand: He exhibits normal range of motion, no tenderness and no bony tenderness. Normal sensation noted. Normal strength noted.       Hands:   The area diagrammed with red is the area where the patient is experiencing locking.        Assessment & Plan:  The encounter diagnosis was Trigger ring finger of left hand. The area was prepped with Betadine.  After obtaining informed consent, a mixture of 1/2 cc of 40 mg/mL Kenalog and 1/2 cc of 0.1% lidocaine was injected adjacent to the flexor tendon sheath at the fourth MCP joint on the palmar surface of the left hand.  The patient tolerated the procedure well without complication.  If trigger finger continues, consult hand surgery

## 2018-12-08 ENCOUNTER — Other Ambulatory Visit: Payer: Self-pay | Admitting: Family Medicine

## 2018-12-14 ENCOUNTER — Other Ambulatory Visit: Payer: Self-pay | Admitting: Family Medicine

## 2018-12-14 MED ORDER — PIOGLITAZONE HCL 30 MG PO TABS: 30.0000 mg | ORAL_TABLET | Freq: Every day | ORAL | 1 refills | Status: DC

## 2018-12-28 ENCOUNTER — Other Ambulatory Visit: Payer: Self-pay | Admitting: Family Medicine

## 2018-12-28 DIAGNOSIS — E785 Hyperlipidemia, unspecified: Secondary | ICD-10-CM

## 2018-12-28 DIAGNOSIS — E119 Type 2 diabetes mellitus without complications: Secondary | ICD-10-CM

## 2018-12-28 DIAGNOSIS — I10 Essential (primary) hypertension: Secondary | ICD-10-CM

## 2018-12-28 DIAGNOSIS — Z79899 Other long term (current) drug therapy: Secondary | ICD-10-CM

## 2018-12-30 ENCOUNTER — Other Ambulatory Visit: Payer: Managed Care, Other (non HMO)

## 2018-12-30 ENCOUNTER — Other Ambulatory Visit: Payer: Self-pay

## 2018-12-30 DIAGNOSIS — E119 Type 2 diabetes mellitus without complications: Secondary | ICD-10-CM

## 2018-12-30 DIAGNOSIS — E785 Hyperlipidemia, unspecified: Secondary | ICD-10-CM

## 2018-12-30 DIAGNOSIS — Z79899 Other long term (current) drug therapy: Secondary | ICD-10-CM

## 2018-12-30 DIAGNOSIS — I10 Essential (primary) hypertension: Secondary | ICD-10-CM

## 2018-12-31 LAB — CBC WITH DIFFERENTIAL/PLATELET
Absolute Monocytes: 559 cells/uL (ref 200–950)
Basophils Absolute: 33 cells/uL (ref 0–200)
Basophils Relative: 0.5 %
Eosinophils Absolute: 182 cells/uL (ref 15–500)
Eosinophils Relative: 2.8 %
HCT: 43.2 % (ref 38.5–50.0)
Hemoglobin: 14.7 g/dL (ref 13.2–17.1)
Lymphs Abs: 2308 cells/uL (ref 850–3900)
MCH: 31.7 pg (ref 27.0–33.0)
MCHC: 34 g/dL (ref 32.0–36.0)
MCV: 93.3 fL (ref 80.0–100.0)
MPV: 9.8 fL (ref 7.5–12.5)
Monocytes Relative: 8.6 %
Neutro Abs: 3419 cells/uL (ref 1500–7800)
Neutrophils Relative %: 52.6 %
Platelets: 149 10*3/uL (ref 140–400)
RBC: 4.63 10*6/uL (ref 4.20–5.80)
RDW: 12.9 % (ref 11.0–15.0)
Total Lymphocyte: 35.5 %
WBC: 6.5 10*3/uL (ref 3.8–10.8)

## 2018-12-31 LAB — COMPREHENSIVE METABOLIC PANEL
AG Ratio: 2 (calc) (ref 1.0–2.5)
ALT: 19 U/L (ref 9–46)
AST: 19 U/L (ref 10–35)
Albumin: 4.7 g/dL (ref 3.6–5.1)
Alkaline phosphatase (APISO): 71 U/L (ref 35–144)
BUN: 13 mg/dL (ref 7–25)
CO2: 25 mmol/L (ref 20–32)
Calcium: 10.1 mg/dL (ref 8.6–10.3)
Chloride: 104 mmol/L (ref 98–110)
Creat: 0.86 mg/dL (ref 0.70–1.33)
Globulin: 2.4 g/dL (calc) (ref 1.9–3.7)
Glucose, Bld: 116 mg/dL — ABNORMAL HIGH (ref 65–99)
Potassium: 4.5 mmol/L (ref 3.5–5.3)
Sodium: 142 mmol/L (ref 135–146)
Total Bilirubin: 1.2 mg/dL (ref 0.2–1.2)
Total Protein: 7.1 g/dL (ref 6.1–8.1)

## 2018-12-31 LAB — LIPID PANEL
Cholesterol: 111 mg/dL (ref <200)
HDL: 53 mg/dL (ref >=40)
LDL Cholesterol (Calc): 45 mg/dL (calc)
Non-HDL Cholesterol (Calc): 58 mg/dL (calc) (ref <130)
Total CHOL/HDL Ratio: 2.1 (calc) (ref <5.0)
Triglycerides: 52 mg/dL (ref <150)

## 2018-12-31 LAB — HEMOGLOBIN A1C
Hgb A1c MFr Bld: 7 % of total Hgb — ABNORMAL HIGH (ref <5.7)
Mean Plasma Glucose: 154 (calc)
eAG (mmol/L): 8.5 (calc)

## 2019-01-10 ENCOUNTER — Other Ambulatory Visit: Payer: Self-pay | Admitting: Family Medicine

## 2019-02-10 ENCOUNTER — Other Ambulatory Visit: Payer: Self-pay

## 2019-02-10 ENCOUNTER — Other Ambulatory Visit: Payer: Self-pay | Admitting: Family Medicine

## 2019-02-10 MED ORDER — JARDIANCE 25 MG PO TABS: 25.0000 mg | ORAL_TABLET | Freq: Every day | ORAL | 5 refills | Status: DC

## 2019-02-16 ENCOUNTER — Other Ambulatory Visit: Payer: Self-pay | Admitting: Family Medicine

## 2019-03-15 ENCOUNTER — Other Ambulatory Visit: Payer: Self-pay | Admitting: Family Medicine

## 2019-04-12 ENCOUNTER — Other Ambulatory Visit: Payer: Self-pay | Admitting: Family Medicine

## 2019-06-30 ENCOUNTER — Other Ambulatory Visit: Payer: Self-pay

## 2019-06-30 ENCOUNTER — Ambulatory Visit
Admission: EM | Admit: 2019-06-30 | Discharge: 2019-06-30 | Disposition: A | Discharge status: Home / Self Care | Payer: Managed Care, Other (non HMO) | Attending: Emergency Medicine | Admitting: Emergency Medicine

## 2019-06-30 DIAGNOSIS — Z20822 Contact with and (suspected) exposure to covid-19: Secondary | ICD-10-CM | POA: Insufficient documentation

## 2019-06-30 DIAGNOSIS — J069 Acute upper respiratory infection, unspecified: Secondary | ICD-10-CM | POA: Diagnosis present

## 2019-06-30 LAB — POCT RAPID STREP A (OFFICE): Rapid Strep A Screen: NEGATIVE

## 2019-06-30 MED ORDER — FLUTICASONE PROPIONATE 50 MCG/ACT NA SUSP: 2.0000 | Freq: Every day | NASAL | 0 refills | Status: DC

## 2019-06-30 MED ORDER — CETIRIZINE HCL 10 MG PO TABS: 10.0000 mg | ORAL_TABLET | Freq: Every day | ORAL | 0 refills | Status: AC

## 2019-06-30 MED ORDER — BENZONATATE 100 MG PO CAPS: 100.0000 mg | ORAL_CAPSULE | Freq: Three times a day (TID) | ORAL | 0 refills | Status: DC

## 2019-06-30 NOTE — ED Triage Notes (Signed)
Pt presents to UC w/ c/o sore throat x3-4 days. Denies fevers at home. Pt states he had body aches and chills for the first day. Pt has been taking ibuprofen.

## 2019-06-30 NOTE — ED Provider Notes (Signed)
Kyle Ray   [id number] 06/30/19 Arrival Time: 0806   CC: COVID symptoms  SUBJECTIVE: History from: patient.  Kyle Ray is a 59 y.o. male who presents with runny nose, congestion, sore throat, mild cough, body aches, and chills x 3-4 days.  Denies sick exposure to COVID, flu or strep.  However, works in a factory.  Has tried OTC ibuprofen without relief.  Denies aggravating factors.  Reports previous symptoms in the past related to "sinus allergies."   Complains of few episodes of loose stools.  Denies fever, SOB, wheezing, chest pain, nausea, changes in bladder habits.    ROS: As per HPI.  All other pertinent ROS negative.     Past Medical History:  Diagnosis Date  . Allergy    allergic rhinitis  . Diabetes mellitus   . Elevated LFTs   . Hyperlipidemia   . Hypertension   . Obesity    History reviewed. No pertinent surgical history. No Known Allergies No current facility-administered medications on file prior to encounter.   Current Outpatient Medications on File Prior to Encounter  Medication Sig Dispense Refill  . aspirin 81 MG tablet Take 81 mg by mouth at bedtime.     Marland Kitchen atorvastatin (LIPITOR) 20 MG tablet TAKE 1 TABLET BY MOUTH ONCE A DAY. 90 tablet 2  . cholecalciferol (VITAMIN D) 1000 UNITS tablet Take 1,000 Units by mouth at bedtime.      Marland Kitchen Cod Liver Oil 1000 MG CAPS Take 1 capsule by mouth daily.    . cyclobenzaprine (FLEXERIL) 5 MG tablet Take 1 tablet (5 mg total) by mouth 3 (three) times daily as needed for muscle spasms. 15 tablet 0  . fish oil-omega-3 fatty acids 1000 MG capsule Take 1 g by mouth 2 (two) times daily. 1 in am, 2 in pm    . glucose blood test strip Check BS bid and prn  - Pt uses one touch ultra meter 100 each 12  . ibuprofen (ADVIL) 600 MG tablet Take 1 tablet (600 mg total) by mouth every 6 (six) hours as needed. 30 tablet 0  . JARDIANCE 25 MG TABS tablet TAKE 1 TABLET BY MOUTH ONCE A DAY. 30 tablet 0  . losartan (COZAAR) 50 MG  tablet TAKE ONE TABLET BY MOUTH ONCE DAILY. 90 tablet 3  . meloxicam (MOBIC) 15 MG tablet Take 1 tablet (15 mg total) by mouth daily. 30 tablet 1  . metFORMIN (GLUCOPHAGE) 1000 MG tablet Take 1 tablet by mouth twice daily 180 tablet 2  . Multiple Vitamins-Minerals (MULTIVITAMIN PO) Take 1 tablet by mouth daily.    . pioglitazone (ACTOS) 30 MG tablet Take 1 tablet (30 mg total) by mouth daily. 90 tablet 1  . pyridOXINE (VITAMIN B-6) 100 MG tablet Take 100 mg by mouth daily.      . vitamin C (ASCORBIC ACID) 500 MG tablet Take 500 mg by mouth at bedtime.       Social History   Socioeconomic History  . Marital status: Married    Spouse name: Not on file  . Number of children: Not on file  . Years of education: Not on file  . Highest education level: Not on file  Occupational History  . Not on file  Tobacco Use  . Smoking status: Never Smoker  . Smokeless tobacco: Current User    Types: Chew  Substance and Sexual Activity  . Alcohol use: No  . Drug use: No    Comment: Used "cocaine" as young adult  .  Sexual activity: Not on file  Other Topics Concern  . Not on file  Social History Narrative  . Not on file   Social Determinants of Health   Financial Resource Strain:   . Difficulty of Paying Living Expenses: Not on file  Food Insecurity:   . Worried About Programme researcher, broadcasting/film/video in the Last Year: Not on file  . Ran Out of Food in the Last Year: Not on file  Transportation Needs:   . Lack of Transportation (Medical): Not on file  . Lack of Transportation (Non-Medical): Not on file  Physical Activity:   . Days of Exercise per Week: Not on file  . Minutes of Exercise per Session: Not on file  Stress:   . Feeling of Stress : Not on file  Social Connections:   . Frequency of Communication with Friends and Family: Not on file  . Frequency of Social Gatherings with Friends and Family: Not on file  . Attends Religious Services: Not on file  . Active Member of Clubs or Organizations:  Not on file  . Attends Banker Meetings: Not on file  . Marital Status: Not on file  Intimate Partner Violence:   . Fear of Current or Ex-Partner: Not on file  . Emotionally Abused: Not on file  . Physically Abused: Not on file  . Sexually Abused: Not on file   Family History  Problem Relation Age of Onset  . Healthy Mother   . Hyperlipidemia Father   . Hypertension Father   . Heart disease Father   . Alcohol abuse Father     OBJECTIVE:  Vitals:   06/30/19 0815  BP: 135/80  Pulse: 82  Resp: 16  Temp: 98.5 F (36.9 C)  TempSrc: Oral  SpO2: 98%     General appearance: alert; appears mildly fatigued, but nontoxic; speaking in full sentences and tolerating own secretions HEENT: NCAT; Ears: EACs clear, TMs pearly gray; Eyes: PERRL.  EOM grossly intact.  Nose: nares patent without rhinorrhea, Throat: oropharynx clear, tonsils non erythematous or enlarged, uvula midline  Neck: supple without LAD Lungs: unlabored respirations, symmetrical air entry; cough: absent; no respiratory distress; CTAB Heart: regular rate and rhythm.   Skin: warm and dry Psychological: alert and cooperative; normal mood and affect  LABS:  Results for orders placed or performed during the hospital encounter of 06/30/19 (from the past 24 hour(s))  POCT rapid strep A     Status: None   Collection Time: 06/30/19  8:29 AM  Result Value Ref Range   Rapid Strep A Screen Negative Negative    ASSESSMENT & PLAN:  1. Suspected COVID-19 virus infection   2. Viral URI with cough     Meds ordered this encounter  Medications  . benzonatate (TESSALON) 100 MG capsule    Sig: Take 1 capsule (100 mg total) by mouth every 8 (eight) hours.    Dispense:  21 capsule    Refill:  0    Order Specific Question:   Supervising Provider    Answer:   Eustace Moore [[id number]]  . cetirizine (ZYRTEC) 10 MG tablet    Sig: Take 1 tablet (10 mg total) by mouth daily.    Dispense:  30 tablet    Refill:  0     Order Specific Question:   Supervising Provider    Answer:   Eustace Moore [[id number]]  . fluticasone (FLONASE) 50 MCG/ACT nasal spray    Sig: Place 2 sprays into both nostrils  daily.    Dispense:  16 g    Refill:  0    Order Specific Question:   Supervising Provider    Answer:   Eustace Moore [[id number]]   Strep negative Culture sent.  We will follow up with you regarding abnormal results. COVID testing ordered.  It will take between 5-7 days for test results.  Someone will contact you regarding abnormal results.    In the meantime: You should remain isolated in your home for 10 days from symptom onset AND greater than 72 hours after symptoms resolution (absence of fever without the use of fever-reducing medication and improvement in respiratory symptoms), whichever is longer Get plenty of rest and push fluids Tessalon perles prescribed.  Use as needed for cough Use OTC zyrtec for nasal congestion, runny nose, and/or sore throat Use OTC flonase for nasal congestion and runny nose Use medications daily for symptom relief Use OTC medications like ibuprofen or tylenol as needed fever or pain Call or go to the ED if you have any new or worsening symptoms such as fever, cough, shortness of breath, chest tightness, chest pain, turning blue, changes in mental status, etc...   Reviewed expectations re: course of current medical issues. Questions answered. Outlined signs and symptoms indicating need for more acute intervention. Patient verbalized understanding. After Visit Summary given.         Alvino Chapel [place], PA-C 06/30/19 [phone]

## 2019-06-30 NOTE — Discharge Instructions (Addendum)
Strep negative Culture sent.  We will follow up with you regarding abnormal results. COVID testing ordered.  It will take between 5-7 days for test results.  Someone will contact you regarding abnormal results.    In the meantime: You should remain isolated in your home for 10 days from symptom onset AND greater than 72 hours after symptoms resolution (absence of fever without the use of fever-reducing medication and improvement in respiratory symptoms), whichever is longer Get plenty of rest and push fluids Tessalon perles prescribed.  Use as needed for cough Use OTC zyrtec for nasal congestion, runny nose, and/or sore throat Use OTC flonase for nasal congestion and runny nose Use medications daily for symptom relief Use OTC medications like ibuprofen or tylenol as needed fever or pain Call or go to the ED if you have any new or worsening symptoms such as fever, cough, shortness of breath, chest tightness, chest pain, turning blue, changes in mental status, etc..Marland Kitchen

## 2019-07-02 LAB — NOVEL CORONAVIRUS, NAA: SARS-CoV-2, NAA: NOT DETECTED

## 2019-07-03 LAB — CULTURE, GROUP A STREP (THRC)

## 2019-07-10 ENCOUNTER — Other Ambulatory Visit: Payer: Self-pay | Admitting: Family Medicine

## 2019-08-07 ENCOUNTER — Other Ambulatory Visit: Payer: Self-pay

## 2019-08-07 ENCOUNTER — Other Ambulatory Visit: Payer: 59

## 2019-08-07 DIAGNOSIS — Z79899 Other long term (current) drug therapy: Secondary | ICD-10-CM

## 2019-08-07 DIAGNOSIS — E785 Hyperlipidemia, unspecified: Secondary | ICD-10-CM

## 2019-08-07 DIAGNOSIS — E119 Type 2 diabetes mellitus without complications: Secondary | ICD-10-CM

## 2019-08-07 DIAGNOSIS — I10 Essential (primary) hypertension: Secondary | ICD-10-CM

## 2019-08-08 LAB — CBC WITH DIFFERENTIAL/PLATELET
Absolute Monocytes: 473 cells/uL (ref 200–950)
Basophils Absolute: 31 cells/uL (ref 0–200)
Basophils Relative: 0.6 %
Eosinophils Absolute: 192 cells/uL (ref 15–500)
Eosinophils Relative: 3.7 %
HCT: 42.9 % (ref 38.5–50.0)
Hemoglobin: 14.3 g/dL (ref 13.2–17.1)
Lymphs Abs: 2356 cells/uL (ref 850–3900)
MCH: 31.8 pg (ref 27.0–33.0)
MCHC: 33.3 g/dL (ref 32.0–36.0)
MCV: 95.5 fL (ref 80.0–100.0)
MPV: 10.3 fL (ref 7.5–12.5)
Monocytes Relative: 9.1 %
Neutro Abs: 2148 cells/uL (ref 1500–7800)
Neutrophils Relative %: 41.3 %
Platelets: 151 10*3/uL (ref 140–400)
RBC: 4.49 10*6/uL (ref 4.20–5.80)
RDW: 13 % (ref 11.0–15.0)
Total Lymphocyte: 45.3 %
WBC: 5.2 10*3/uL (ref 3.8–10.8)

## 2019-08-08 LAB — COMPREHENSIVE METABOLIC PANEL
AG Ratio: 2 (calc) (ref 1.0–2.5)
ALT: 20 U/L (ref 9–46)
AST: 23 U/L (ref 10–35)
Albumin: 4.6 g/dL (ref 3.6–5.1)
Alkaline phosphatase (APISO): 72 U/L (ref 35–144)
BUN: 14 mg/dL (ref 7–25)
CO2: 29 mmol/L (ref 20–32)
Calcium: 9.7 mg/dL (ref 8.6–10.3)
Chloride: 104 mmol/L (ref 98–110)
Creat: 0.96 mg/dL (ref 0.70–1.33)
Globulin: 2.3 g/dL (calc) (ref 1.9–3.7)
Glucose, Bld: 120 mg/dL — ABNORMAL HIGH (ref 65–99)
Potassium: 4.3 mmol/L (ref 3.5–5.3)
Sodium: 141 mmol/L (ref 135–146)
Total Bilirubin: 1.1 mg/dL (ref 0.2–1.2)
Total Protein: 6.9 g/dL (ref 6.1–8.1)

## 2019-08-08 LAB — LIPID PANEL
Cholesterol: 120 mg/dL (ref <200)
HDL: 55 mg/dL (ref >=40)
LDL Cholesterol (Calc): 51 mg/dL (calc)
Non-HDL Cholesterol (Calc): 65 mg/dL (calc) (ref <130)
Total CHOL/HDL Ratio: 2.2 (calc) (ref <5.0)
Triglycerides: 48 mg/dL (ref <150)

## 2019-08-08 LAB — HEMOGLOBIN A1C
Hgb A1c MFr Bld: 6.8 % of total Hgb — ABNORMAL HIGH (ref <5.7)
Mean Plasma Glucose: 148 (calc)
eAG (mmol/L): 8.2 (calc)

## 2019-08-17 ENCOUNTER — Ambulatory Visit: Payer: 59 | Admitting: Family Medicine

## 2019-08-17 ENCOUNTER — Other Ambulatory Visit: Payer: Self-pay

## 2019-08-17 ENCOUNTER — Encounter: Payer: Self-pay | Admitting: Family Medicine

## 2019-08-17 ENCOUNTER — Telehealth: Payer: Self-pay | Admitting: *Deleted

## 2019-08-17 VITALS — BP 124/60 | HR 78 | Temp 97.4°F | Resp 18 | Ht 67.0 in | Wt 208.0 lb

## 2019-08-17 DIAGNOSIS — E118 Type 2 diabetes mellitus with unspecified complications: Secondary | ICD-10-CM

## 2019-08-17 DIAGNOSIS — I10 Essential (primary) hypertension: Secondary | ICD-10-CM | POA: Diagnosis not present

## 2019-08-17 DIAGNOSIS — E785 Hyperlipidemia, unspecified: Secondary | ICD-10-CM | POA: Diagnosis not present

## 2019-08-17 NOTE — Telephone Encounter (Signed)
Received verbal orders for Cologuard.   Order placed via [employer].   Cologuard (Order [id number])

## 2019-08-17 NOTE — Progress Notes (Signed)
Subjective:    Patient ID: Kyle Ray, male    DOB: April 17, 1961, 59 y.o.   MRN: [id number]  Medication Refill   Patient is here today for follow-up of his diabetes mellitus type 2, hypertension, and hyperlipidemia.  Patient has not had an HIV test or a hepatitis C test.  We discussed this today and he declines both of these test.  He has not had his COVID-19 vaccination.  I strongly encouraged him to receive this.  He is also long overdue for diabetic eye exam, diabetic foot exam which is performed today, and colonoscopy.  We had a long discussion about the colonoscopy and he does not feel that he can miss work.  However he would be willing to receive the Cologuard.  He defers with diabetic eye exam at the present time due to scheduling conflicts.  His blood pressure today is well controlled at 124/60.  I reviewed his lab work below.  His most recent lab work is listed below.  Appointment on 08/07/2019  Component Date Value Ref Range Status  . WBC 08/07/2019 5.2  3.8 - 10.8 Thousand/uL Final  . RBC 08/07/2019 4.49  4.20 - 5.80 Million/uL Final  . Hemoglobin 08/07/2019 14.3  13.2 - 17.1 g/dL Final  . HCT 40/98/1191 42.9  38.5 - 50.0 % Final  . MCV 08/07/2019 95.5  80.0 - 100.0 fL Final  . MCH 08/07/2019 31.8  27.0 - 33.0 pg Final  . MCHC 08/07/2019 33.3  32.0 - 36.0 g/dL Final  . RDW 47/82/9562 13.0  11.0 - 15.0 % Final  . Platelets 08/07/2019 151  140 - 400 Thousand/uL Final  . MPV 08/07/2019 10.3  7.5 - 12.5 fL Final  . Neutro Abs 08/07/2019 2,148  1,500 - 7,800 cells/uL Final  . Lymphs Abs 08/07/2019 2,356  850 - 3,900 cells/uL Final  . Absolute Monocytes 08/07/2019 473  200 - 950 cells/uL Final  . Eosinophils Absolute 08/07/2019 192  15 - 500 cells/uL Final  . Basophils Absolute 08/07/2019 31  0 - 200 cells/uL Final  . Neutrophils Relative % 08/07/2019 41.3  % Final  . Total Lymphocyte 08/07/2019 45.3  % Final  . Monocytes Relative 08/07/2019 9.1  % Final  . Eosinophils Relative  08/07/2019 3.7  % Final  . Basophils Relative 08/07/2019 0.6  % Final  . Glucose, Bld 08/07/2019 120* 65 - 99 mg/dL Final   Comment: .            Fasting reference interval . For someone without known diabetes, a glucose value between 100 and 125 mg/dL is consistent with prediabetes and should be confirmed with a follow-up test. .   . BUN 08/07/2019 14  7 - 25 mg/dL Final  . Creat 13/12/6576 0.96  0.70 - 1.33 mg/dL Final   Comment: For patients >50 years of age, the reference limit for Creatinine is approximately 13% higher for people identified as African-American. .   Edwena Felty Ratio 08/07/2019 NOT APPLICABLE  6 - 22 (calc) Final  . Sodium 08/07/2019 141  135 - 146 mmol/L Final  . Potassium 08/07/2019 4.3  3.5 - 5.3 mmol/L Final  . Chloride 08/07/2019 104  98 - 110 mmol/L Final  . CO2 08/07/2019 29  20 - 32 mmol/L Final  . Calcium 08/07/2019 9.7  8.6 - 10.3 mg/dL Final  . Total Protein 08/07/2019 6.9  6.1 - 8.1 g/dL Final  . Albumin 46/96/2952 4.6  3.6 - 5.1 g/dL Final  . Globulin 84/13/2440 2.3  1.9 - 3.7 g/dL (calc) Final  . AG Ratio 08/07/2019 2.0  1.0 - 2.5 (calc) Final  . Total Bilirubin 08/07/2019 1.1  0.2 - 1.2 mg/dL Final  . Alkaline phosphatase (APISO) 08/07/2019 72  35 - 144 U/L Final  . AST 08/07/2019 23  10 - 35 U/L Final  . ALT 08/07/2019 20  9 - 46 U/L Final  . Hgb A1c MFr Bld 08/07/2019 6.8* <5.7 % of total Hgb Final   Comment: For someone without known diabetes, a hemoglobin A1c value of 6.5% or greater indicates that they may have  diabetes and this should be confirmed with a follow-up  test. . For someone with known diabetes, a value <7% indicates  that their diabetes is well controlled and a value  greater than or equal to 7% indicates suboptimal  control. A1c targets should be individualized based on  duration of diabetes, age, comorbid conditions, and  other considerations. . Currently, no consensus exists regarding use of hemoglobin A1c  for diagnosis of diabetes for children. .   . Mean Plasma Glucose 08/07/2019 148  (calc) Final  . eAG (mmol/L) 08/07/2019 8.2  (calc) Final  . Cholesterol 08/07/2019 120  <200 mg/dL Final  . HDL 21/19/4174 55  > OR = 40 mg/dL Final  . Triglycerides 08/07/2019 48  <150 mg/dL Final  . LDL Cholesterol (Calc) 08/07/2019 51  mg/dL (calc) Final   Comment: Reference range: <100 . Desirable range <100 mg/dL for primary prevention;   <70 mg/dL for patients with CHD or diabetic patients  with > or = 2 CHD risk factors. Marland Kitchen LDL-C is now calculated using the Martin-Hopkins  calculation, which is a validated novel method providing  better accuracy than the Friedewald equation in the  estimation of LDL-C.  Horald Pollen et al. Lenox Ahr. 0814;481(85): 2061-2068  (http://education.[facility].com/faq/FAQ164)   . Total CHOL/HDL Ratio 08/07/2019 2.2  <6.3 (calc) Final  . Non-HDL Cholesterol (Calc) 08/07/2019 65  <130 mg/dL (calc) Final   Comment: For patients with diabetes plus 1 major ASCVD risk  factor, treating to a non-HDL-C goal of <100 mg/dL  (LDL-C of <14 mg/dL) is considered a therapeutic  option.      Past Medical History:  Diagnosis Date  . Allergy    allergic rhinitis  . Diabetes mellitus   . Elevated LFTs   . Hyperlipidemia   . Hypertension   . Obesity    No past surgical history on file. Current Outpatient Medications on File Prior to Visit  Medication Sig Dispense Refill  . aspirin 81 MG tablet Take 81 mg by mouth at bedtime.     Marland Kitchen atorvastatin (LIPITOR) 20 MG tablet TAKE 1 TABLET BY MOUTH ONCE A DAY. 90 tablet 2  . benzonatate (TESSALON) 100 MG capsule Take 1 capsule (100 mg total) by mouth every 8 (eight) hours. 21 capsule 0  . cetirizine (ZYRTEC) 10 MG tablet Take 1 tablet (10 mg total) by mouth daily. 30 tablet 0  . cholecalciferol (VITAMIN D) 1000 UNITS tablet Take 1,000 Units by mouth at bedtime.      Marland Kitchen Cod Liver Oil 1000 MG CAPS Take 1 capsule by mouth daily.    .  cyclobenzaprine (FLEXERIL) 5 MG tablet Take 1 tablet (5 mg total) by mouth 3 (three) times daily as needed for muscle spasms. 15 tablet 0  . fish oil-omega-3 fatty acids 1000 MG capsule Take 1 g by mouth 2 (two) times daily. 1 in am, 2 in pm    . fluticasone (  FLONASE) 50 MCG/ACT nasal spray Place 2 sprays into both nostrils daily. 16 g 0  . glucose blood test strip Check BS bid and prn  - Pt uses one touch ultra meter 100 each 12  . ibuprofen (ADVIL) 600 MG tablet Take 1 tablet (600 mg total) by mouth every 6 (six) hours as needed. 30 tablet 0  . JARDIANCE 25 MG TABS tablet TAKE 1 TABLET BY MOUTH ONCE A DAY. 30 tablet 0  . losartan (COZAAR) 50 MG tablet TAKE ONE TABLET BY MOUTH ONCE DAILY. 90 tablet 3  . meloxicam (MOBIC) 15 MG tablet Take 1 tablet (15 mg total) by mouth daily. 30 tablet 1  . metFORMIN (GLUCOPHAGE) 1000 MG tablet Take 1 tablet by mouth twice daily 180 tablet 3  . Multiple Vitamins-Minerals (MULTIVITAMIN PO) Take 1 tablet by mouth daily.    . pioglitazone (ACTOS) 30 MG tablet Take 1 tablet (30 mg total) by mouth daily. 90 tablet 1  . pyridOXINE (VITAMIN B-6) 100 MG tablet Take 100 mg by mouth daily.      . vitamin C (ASCORBIC ACID) 500 MG tablet Take 500 mg by mouth at bedtime.       No current facility-administered medications on file prior to visit.    No Known Allergies Social History   Socioeconomic History  . Marital status: Married    Spouse name: Not on file  . Number of children: Not on file  . Years of education: Not on file  . Highest education level: Not on file  Occupational History  . Not on file  Tobacco Use  . Smoking status: Never Smoker  . Smokeless tobacco: Current User    Types: Chew  Substance and Sexual Activity  . Alcohol use: No  . Drug use: No    Comment: Used "cocaine" as young adult  . Sexual activity: Not on file  Other Topics Concern  . Not on file  Social History Narrative  . Not on file   Social Determinants of Health    Financial Resource Strain:   . Difficulty of Paying Living Expenses:   Food Insecurity:   . Worried About Charity fundraiser in the Last Year:   . Arboriculturist in the Last Year:   Transportation Needs:   . Film/video editor (Medical):   Marland Kitchen Lack of Transportation (Non-Medical):   Physical Activity:   . Days of Exercise per Week:   . Minutes of Exercise per Session:   Stress:   . Feeling of Stress :   Social Connections:   . Frequency of Communication with Friends and Family:   . Frequency of Social Gatherings with Friends and Family:   . Attends Religious Services:   . Active Member of Clubs or Organizations:   . Attends Archivist Meetings:   Marland Kitchen Marital Status:   Intimate Partner Violence:   . Fear of Current or Ex-Partner:   . Emotionally Abused:   Marland Kitchen Physically Abused:   . Sexually Abused:    Family History  Problem Relation Age of Onset  . Healthy Mother   . Hyperlipidemia Father   . Hypertension Father   . Heart disease Father   . Alcohol abuse Father       Review of Systems  All other systems reviewed and are negative.      Objective:   Physical Exam  Constitutional: He is oriented to person, place, and time. He appears well-developed and well-nourished. No distress.  HENT:  Head: Normocephalic and atraumatic.  Right Ear: External ear normal.  Left Ear: External ear normal.  Nose: Nose normal.  Mouth/Throat: Oropharynx is clear and moist.  Eyes: Pupils are equal, round, and reactive to light. Conjunctivae and EOM are normal. Right eye exhibits no discharge. Left eye exhibits no discharge. No scleral icterus.  Neck: No JVD present. No tracheal deviation present. No thyromegaly present.  Cardiovascular: Normal rate, regular rhythm, normal heart sounds and intact distal pulses. Exam reveals no gallop and no friction rub.  No murmur heard. Pulmonary/Chest: Effort normal and breath sounds normal. No stridor. No respiratory distress. He has no  wheezes. He has no rales.  Abdominal: Soft. Bowel sounds are normal. He exhibits no distension and no mass. There is no abdominal tenderness. There is no rebound and no guarding.  Genitourinary:    Prostate and rectum normal.  Rectum:     Guaiac result negative.  No penile tenderness.  Musculoskeletal:        General: No tenderness or edema. Normal range of motion.     Cervical back: Normal range of motion and neck supple.  Lymphadenopathy:    He has no cervical adenopathy.  Neurological: He is alert and oriented to person, place, and time. He has normal reflexes. No cranial nerve deficit. He exhibits normal muscle tone. Coordination normal.  Skin: Skin is warm and dry. No rash noted. He is not diaphoretic. No erythema. No pallor.  Psychiatric: He has a normal mood and affect. His behavior is normal. Judgment and thought content normal.  Vitals reviewed.         Assessment & Plan:  Diabetes mellitus type 2 with complications (HCC)  Essential hypertension  Hyperlipidemia, unspecified hyperlipidemia type  Patient's blood pressure is well controlled.  Cholesterol is outstanding.  His hemoglobin A1c is acceptable at 6.8.  I did recommend a colonoscopy but the patient ultimately agreed to Cologuard screening.  Offered an HIV and hepatitis C test but the patient declined.  Recommended a diabetic eye exam.  Patient deferred.  Recommended to perform diabetic foot exam which was normal.

## 2019-08-17 NOTE — Telephone Encounter (Signed)
-----   Message from Ricard Dillon, [place] sent at 08/17/2019  8:42 AM EDT -----  ----- Message ----- From: Donita Brooks, MD Sent: 08/17/2019   8:05 AM EDT To: Ricard Dillon, RMA  Schedule cologuard

## 2019-08-28 LAB — COLOGUARD

## 2019-09-13 ENCOUNTER — Other Ambulatory Visit: Payer: Self-pay | Admitting: Family Medicine

## 2019-10-11 ENCOUNTER — Other Ambulatory Visit: Payer: Self-pay | Admitting: Family Medicine

## 2019-11-06 ENCOUNTER — Other Ambulatory Visit: Payer: Self-pay | Admitting: Family Medicine

## 2019-12-12 ENCOUNTER — Other Ambulatory Visit: Payer: Self-pay | Admitting: Family Medicine

## 2020-01-09 ENCOUNTER — Other Ambulatory Visit: Payer: Self-pay | Admitting: Family Medicine

## 2020-01-25 ENCOUNTER — Other Ambulatory Visit: Payer: Self-pay

## 2020-01-25 ENCOUNTER — Ambulatory Visit (INDEPENDENT_AMBULATORY_CARE_PROVIDER_SITE_OTHER): Payer: 59 | Admitting: Family Medicine

## 2020-01-25 VITALS — BP 120/70 | HR 61 | Temp 96.3°F | Ht 67.0 in | Wt 205.0 lb

## 2020-01-25 DIAGNOSIS — E118 Type 2 diabetes mellitus with unspecified complications: Secondary | ICD-10-CM

## 2020-01-25 DIAGNOSIS — I10 Essential (primary) hypertension: Secondary | ICD-10-CM

## 2020-01-25 DIAGNOSIS — E785 Hyperlipidemia, unspecified: Secondary | ICD-10-CM | POA: Diagnosis not present

## 2020-01-25 DIAGNOSIS — M65342 Trigger finger, left ring finger: Secondary | ICD-10-CM

## 2020-01-25 DIAGNOSIS — Z125 Encounter for screening for malignant neoplasm of prostate: Secondary | ICD-10-CM | POA: Diagnosis not present

## 2020-01-25 DIAGNOSIS — M6530 Trigger finger, unspecified finger: Secondary | ICD-10-CM

## 2020-01-25 NOTE — Progress Notes (Signed)
Subjective:    Patient ID: Kyle Ray, male    DOB: [birth year], 59 y.o.   MRN: [id number]  Medication Refill   Patient is here today for a diabetes test.  He has had his Covid vaccination.  He denies any chest pain shortness of breath or dyspnea on exertion.  Blood pressure is well controlled.  He denies any hypoglycemia.  He denies any polyuria polydipsia or blurry vision.  He denies any neuropathy in his feet.  Diabetic foot exam was performed today and is normal.  Patient denies any myalgias or right upper quadrant pain.  He does have a trigger finger in his left hand at the fourth MCP joint.  He is requesting a cortisone injection in that area Past Medical History:  Diagnosis Date  . Allergy    allergic rhinitis  . Diabetes mellitus   . Elevated LFTs   . Hyperlipidemia   . Hypertension   . Obesity    No past surgical history on file. Current Outpatient Medications on File Prior to Visit  Medication Sig Dispense Refill  . aspirin 81 MG tablet Take 81 mg by mouth at bedtime.     Marland Kitchen atorvastatin (LIPITOR) 20 MG tablet TAKE 1 TABLET BY MOUTH ONCE A DAY. 90 tablet 3  . cetirizine (ZYRTEC) 10 MG tablet Take 1 tablet (10 mg total) by mouth daily. 30 tablet 0  . cholecalciferol (VITAMIN D) 1000 UNITS tablet Take 1,000 Units by mouth at bedtime.      Marland Kitchen Cod Liver Oil 1000 MG CAPS Take 1 capsule by mouth daily.    . cyclobenzaprine (FLEXERIL) 5 MG tablet Take 1 tablet (5 mg total) by mouth 3 (three) times daily as needed for muscle spasms. 15 tablet 0  . fish oil-omega-3 fatty acids 1000 MG capsule Take 1 g by mouth 2 (two) times daily. 1 in am, 2 in pm    . fluticasone (FLONASE) 50 MCG/ACT nasal spray Place 2 sprays into both nostrils daily. 16 g 0  . glucose blood test strip Check BS bid and prn  - Pt uses one touch ultra meter 100 each 12  . ibuprofen (ADVIL) 600 MG tablet Take 1 tablet (600 mg total) by mouth every 6 (six) hours as needed. 30 tablet 0  . JARDIANCE 25 MG TABS tablet  TAKE 1 TABLET BY MOUTH ONCE A DAY. 90 tablet 3  . losartan (COZAAR) 50 MG tablet TAKE ONE TABLET BY MOUTH ONCE DAILY. 90 tablet 3  . meloxicam (MOBIC) 15 MG tablet Take 1 tablet (15 mg total) by mouth daily. 30 tablet 1  . metFORMIN (GLUCOPHAGE) 1000 MG tablet Take 1 tablet by mouth twice daily 180 tablet 3  . Multiple Vitamins-Minerals (MULTIVITAMIN PO) Take 1 tablet by mouth daily.    . pioglitazone (ACTOS) 30 MG tablet TAKE 1 TABLET EVERY DAY. 30 tablet 2  . pyridOXINE (VITAMIN B-6) 100 MG tablet Take 100 mg by mouth daily.      . vitamin C (ASCORBIC ACID) 500 MG tablet Take 500 mg by mouth at bedtime.       No current facility-administered medications on file prior to visit.    No Known Allergies Social History   Socioeconomic History  . Marital status: Married    Spouse name: Not on file  . Number of children: Not on file  . Years of education: Not on file  . Highest education level: Not on file  Occupational History  . Not on file  Tobacco  Use  . Smoking status: Never Smoker  . Smokeless tobacco: Current User    Types: Chew  Substance and Sexual Activity  . Alcohol use: No  . Drug use: No    Comment: Used "cocaine" as young adult  . Sexual activity: Not on file  Other Topics Concern  . Not on file  Social History Narrative  . Not on file   Social Determinants of Health   Financial Resource Strain:   . Difficulty of Paying Living Expenses: Not on file  Food Insecurity:   . Worried About Programme researcher, broadcasting/film/video in the Last Year: Not on file  . Ran Out of Food in the Last Year: Not on file  Transportation Needs:   . Lack of Transportation (Medical): Not on file  . Lack of Transportation (Non-Medical): Not on file  Physical Activity:   . Days of Exercise per Week: Not on file  . Minutes of Exercise per Session: Not on file  Stress:   . Feeling of Stress : Not on file  Social Connections:   . Frequency of Communication with Friends and Family: Not on file  .  Frequency of Social Gatherings with Friends and Family: Not on file  . Attends Religious Services: Not on file  . Active Member of Clubs or Organizations: Not on file  . Attends Banker Meetings: Not on file  . Marital Status: Not on file  Intimate Partner Violence:   . Fear of Current or Ex-Partner: Not on file  . Emotionally Abused: Not on file  . Physically Abused: Not on file  . Sexually Abused: Not on file   Family History  Problem Relation Age of Onset  . Healthy Mother   . Hyperlipidemia Father   . Hypertension Father   . Heart disease Father   . Alcohol abuse Father       Review of Systems  All other systems reviewed and are negative.      Objective:   Physical Exam Vitals reviewed.  Constitutional:      General: He is not in acute distress.    Appearance: He is well-developed. He is not diaphoretic.  HENT:     Head: Normocephalic and atraumatic.     Right Ear: External ear normal.     Left Ear: External ear normal.     Nose: Nose normal.  Eyes:     General: No scleral icterus.       Right eye: No discharge.        Left eye: No discharge.     Conjunctiva/sclera: Conjunctivae normal.     Pupils: Pupils are equal, round, and reactive to light.  Neck:     Thyroid: No thyromegaly.     Vascular: No JVD.     Trachea: No tracheal deviation.  Cardiovascular:     Rate and Rhythm: Normal rate and regular rhythm.     Heart sounds: Normal heart sounds. No murmur heard.  No friction rub. No gallop.   Pulmonary:     Effort: Pulmonary effort is normal. No respiratory distress.     Breath sounds: Normal breath sounds. No stridor. No wheezing or rales.  Abdominal:     General: Bowel sounds are normal. There is no distension.     Palpations: Abdomen is soft. There is no mass.     Tenderness: There is no abdominal tenderness. There is no guarding or rebound.  Genitourinary:    Penis: No tenderness.      Prostate:  Normal.     Rectum: Normal. Guaiac  result negative.  Musculoskeletal:        General: No tenderness. Normal range of motion.     Cervical back: Normal range of motion and neck supple.  Lymphadenopathy:     Cervical: No cervical adenopathy.  Skin:    General: Skin is warm and dry.     Coloration: Skin is not pale.     Findings: No erythema or rash.  Neurological:     Mental Status: He is alert and oriented to person, place, and time.     Cranial Nerves: No cranial nerve deficit.     Motor: No abnormal muscle tone.     Coordination: Coordination normal.     Deep Tendon Reflexes: Reflexes are normal and symmetric.  Psychiatric:        Behavior: Behavior normal.        Thought Content: Thought content normal.        Judgment: Judgment normal.           Assessment & Plan:  Diabetes mellitus type 2 with complications (HCC) - Plan: CBC with Differential/Platelet, COMPLETE METABOLIC PANEL WITH GFR, Lipid panel, Microalbumin, urine, Hemoglobin A1c, PSA  Essential hypertension  Hyperlipidemia, unspecified hyperlipidemia type  Prostate cancer screening  Trigger finger of left hand, unspecified finger   Diabetic foot exam is normal.  Blood pressure is excellent.  Check hemoglobin A1c.  Screen the patient for prostate cancer with a PSA.  Check a urine microalbumin.  Check a fasting lipid panel.  Goal hemoglobin A1c is less than 7.  Goal LDL cholesterol is less than 100.  Using sterile technique, I injected a nodular area in the flexor tender on the palmar surface of the fourth MCP joint with 1/2 cc of lidocaine and 1/2 cc of 40 mg/mL Kenalog.  I injected just adjacent to the tendon.  Recheck if no better in 2 weeks.

## 2020-01-26 LAB — CBC WITH DIFFERENTIAL/PLATELET
Absolute Monocytes: 425 cells/uL (ref 200–950)
Basophils Absolute: 30 cells/uL (ref 0–200)
Basophils Relative: 0.6 %
Eosinophils Absolute: 200 cells/uL (ref 15–500)
Eosinophils Relative: 4 %
HCT: 44.1 % (ref 38.5–50.0)
Hemoglobin: 14.5 g/dL (ref 13.2–17.1)
Lymphs Abs: 2045 cells/uL (ref 850–3900)
MCH: 31.6 pg (ref 27.0–33.0)
MCHC: 32.9 g/dL (ref 32.0–36.0)
MCV: 96.1 fL (ref 80.0–100.0)
MPV: 9.8 fL (ref 7.5–12.5)
Monocytes Relative: 8.5 %
Neutro Abs: 2300 cells/uL (ref 1500–7800)
Neutrophils Relative %: 46 %
Platelets: 152 10*3/uL (ref 140–400)
RBC: 4.59 10*6/uL (ref 4.20–5.80)
RDW: 12.7 % (ref 11.0–15.0)
Total Lymphocyte: 40.9 %
WBC: 5 10*3/uL (ref 3.8–10.8)

## 2020-01-26 LAB — PSA: PSA: 0.2 ng/mL (ref <=4.0)

## 2020-01-26 LAB — LIPID PANEL
Cholesterol: 115 mg/dL (ref <200)
HDL: 55 mg/dL (ref >=40)
LDL Cholesterol (Calc): 46 mg/dL (calc)
Non-HDL Cholesterol (Calc): 60 mg/dL (calc) (ref <130)
Total CHOL/HDL Ratio: 2.1 (calc) (ref <5.0)
Triglycerides: 59 mg/dL (ref <150)

## 2020-01-26 LAB — HEMOGLOBIN A1C
Hgb A1c MFr Bld: 6.8 % of total Hgb — ABNORMAL HIGH (ref <5.7)
Mean Plasma Glucose: 148 (calc)
eAG (mmol/L): 8.2 (calc)

## 2020-01-26 LAB — COMPLETE METABOLIC PANEL WITH GFR
AG Ratio: 1.9 (calc) (ref 1.0–2.5)
ALT: 17 U/L (ref 9–46)
AST: 20 U/L (ref 10–35)
Albumin: 4.6 g/dL (ref 3.6–5.1)
Alkaline phosphatase (APISO): 68 U/L (ref 35–144)
BUN: 14 mg/dL (ref 7–25)
CO2: 29 mmol/L (ref 20–32)
Calcium: 9.6 mg/dL (ref 8.6–10.3)
Chloride: 104 mmol/L (ref 98–110)
Creat: 1.07 mg/dL (ref 0.70–1.33)
GFR, Est African American: 88 mL/min/{1.73_m2} (ref >=60)
GFR, Est Non African American: 76 mL/min/{1.73_m2} (ref >=60)
Globulin: 2.4 g/dL (calc) (ref 1.9–3.7)
Glucose, Bld: 110 mg/dL — ABNORMAL HIGH (ref 65–99)
Potassium: 4.3 mmol/L (ref 3.5–5.3)
Sodium: 141 mmol/L (ref 135–146)
Total Bilirubin: 0.7 mg/dL (ref 0.2–1.2)
Total Protein: 7 g/dL (ref 6.1–8.1)

## 2020-01-26 LAB — MICROALBUMIN, URINE: Microalb, Ur: 0.6 mg/dL

## 2020-02-13 LAB — HM DIABETES EYE EXAM

## 2020-02-14 ENCOUNTER — Ambulatory Visit
Admission: EM | Admit: 2020-02-14 | Discharge: 2020-02-14 | Disposition: A | Discharge status: Home / Self Care | Payer: 59 | Attending: Emergency Medicine | Admitting: Emergency Medicine

## 2020-02-14 ENCOUNTER — Encounter: Payer: Self-pay | Admitting: Emergency Medicine

## 2020-02-14 DIAGNOSIS — B354 Tinea corporis: Secondary | ICD-10-CM

## 2020-02-14 MED ORDER — CICLOPIROX OLAMINE 0.77 % EX CREA: TOPICAL_CREAM | Freq: Two times a day (BID) | CUTANEOUS | 0 refills | Status: DC

## 2020-02-14 NOTE — Discharge Instructions (Addendum)
Prescribed ciclopirox.  Use twice daily until symptoms resolution Use mild soap daily.  Avoid soaps with additives, or perfumes Have animals treated for possible ring worm as well Follow up with PCP as needed Return or go to the ED if you have any new or worsening symptoms such as worsening rash, pain, fever, chills, nausea, vomiting, abdominal pain, etc..Marland Kitchen

## 2020-02-14 NOTE — ED Provider Notes (Signed)
[facility]   [id number] 02/14/20 Arrival Time: 0912  CC: Rash  SUBJECTIVE:  Kyle Ray is a 59 y.o. male who presents with a rash/ wound to LT ankle x 2 weeks.  Symptoms began after trimming bushes.  Localizes the rash to LT ankle.  Describes it as somewhat itchy.  Has tried OTC hydrocortisone and calamine with minimal relief.  Symptoms are made worse with scratching.  Denies similar symptoms in the past.   Denies fever, chills, nausea, vomiting, swelling, discharge, pain.    Does admit to having animals.  Has a few outside dogs and "14 cats" inside  ROS: As per HPI.  All other pertinent ROS negative.     Past Medical History:  Diagnosis Date  . Allergy    allergic rhinitis  . Diabetes mellitus   . Elevated LFTs   . Hyperlipidemia   . Hypertension   . Obesity    History reviewed. No pertinent surgical history. No Known Allergies No current facility-administered medications on file prior to encounter.   Current Outpatient Medications on File Prior to Encounter  Medication Sig Dispense Refill  . aspirin 81 MG tablet Take 81 mg by mouth at bedtime.     Marland Kitchen atorvastatin (LIPITOR) 20 MG tablet TAKE 1 TABLET BY MOUTH ONCE A DAY. 90 tablet 3  . cetirizine (ZYRTEC) 10 MG tablet Take 1 tablet (10 mg total) by mouth daily. 30 tablet 0  . cholecalciferol (VITAMIN D) 1000 UNITS tablet Take 1,000 Units by mouth at bedtime.      Marland Kitchen Cod Liver Oil 1000 MG CAPS Take 1 capsule by mouth daily.    . cyclobenzaprine (FLEXERIL) 5 MG tablet Take 1 tablet (5 mg total) by mouth 3 (three) times daily as needed for muscle spasms. 15 tablet 0  . fish oil-omega-3 fatty acids 1000 MG capsule Take 1 g by mouth 2 (two) times daily. 1 in am, 2 in pm    . fluticasone (FLONASE) 50 MCG/ACT nasal spray Place 2 sprays into both nostrils daily. 16 g 0  . glucose blood test strip Check BS bid and prn  - Pt uses one touch ultra meter 100 each 12  . ibuprofen (ADVIL) 600 MG tablet Take 1 tablet (600 mg  total) by mouth every 6 (six) hours as needed. 30 tablet 0  . JARDIANCE 25 MG TABS tablet TAKE 1 TABLET BY MOUTH ONCE A DAY. 90 tablet 3  . losartan (COZAAR) 50 MG tablet TAKE ONE TABLET BY MOUTH ONCE DAILY. 90 tablet 3  . meloxicam (MOBIC) 15 MG tablet Take 1 tablet (15 mg total) by mouth daily. 30 tablet 1  . metFORMIN (GLUCOPHAGE) 1000 MG tablet Take 1 tablet by mouth twice daily 180 tablet 3  . Multiple Vitamins-Minerals (MULTIVITAMIN PO) Take 1 tablet by mouth daily.    . pioglitazone (ACTOS) 30 MG tablet TAKE 1 TABLET EVERY DAY. 30 tablet 2  . pyridOXINE (VITAMIN B-6) 100 MG tablet Take 100 mg by mouth daily.      . vitamin C (ASCORBIC ACID) 500 MG tablet Take 500 mg by mouth at bedtime.       Social History   Socioeconomic History  . Marital status: Married    Spouse name: Not on file  . Number of children: Not on file  . Years of education: Not on file  . Highest education level: Not on file  Occupational History  . Not on file  Tobacco Use  . Smoking status: Never Smoker  .  Smokeless tobacco: Current User    Types: Chew  Substance and Sexual Activity  . Alcohol use: No  . Drug use: No    Comment: Used "cocaine" as young adult  . Sexual activity: Not on file  Other Topics Concern  . Not on file  Social History Narrative  . Not on file   Social Determinants of Health   Financial Resource Strain:   . Difficulty of Paying Living Expenses: Not on file  Food Insecurity:   . Worried About Programme researcher, broadcasting/film/video in the Last Year: Not on file  . Ran Out of Food in the Last Year: Not on file  Transportation Needs:   . Lack of Transportation (Medical): Not on file  . Lack of Transportation (Non-Medical): Not on file  Physical Activity:   . Days of Exercise per Week: Not on file  . Minutes of Exercise per Session: Not on file  Stress:   . Feeling of Stress : Not on file  Social Connections:   . Frequency of Communication with Friends and Family: Not on file  . Frequency  of Social Gatherings with Friends and Family: Not on file  . Attends Religious Services: Not on file  . Active Member of Clubs or Organizations: Not on file  . Attends Banker Meetings: Not on file  . Marital Status: Not on file  Intimate Partner Violence:   . Fear of Current or Ex-Partner: Not on file  . Emotionally Abused: Not on file  . Physically Abused: Not on file  . Sexually Abused: Not on file   Family History  Problem Relation Age of Onset  . Healthy Mother   . Hyperlipidemia Father   . Hypertension Father   . Heart disease Father   . Alcohol abuse Father     OBJECTIVE: Vitals:   02/14/20 0918 02/14/20 0924  BP: (!) 150/83   Pulse: 82   Resp: 17   Temp: 98.1 F (36.7 C)   TempSrc: Oral   SpO2: 97%   Weight:  205 lb 0.4 oz (93 kg)  Height:  5\' 7"  (1.702 m)    General appearance: alert; no distress Head: NCAT Lungs: normal respiratory effort Extremities: no edema Skin: warm and dry; erythematous macular papular rash in coin distribution to LT medial ankle, slight central clearing with elevated margins, NTTP, no obvious drainage or bleeding Psychological: alert and cooperative; normal mood and affect  ASSESSMENT & PLAN:  1. Tinea corporis     Meds ordered this encounter  Medications  . ciclopirox (LOPROX) 0.77 % cream    Sig: Apply topically 2 (two) times daily.    Dispense:  30 g    Refill:  0    Order Specific Question:   Supervising Provider    Answer:   Eustace Moore   Prescribed ciclopirox.  Use twice daily until symptoms resolution Use mild soap daily.  Avoid soaps with additives, or perfumes Have animals treated for possible ring worm as well Follow up with PCP as needed Return or go to the ED if you have any new or worsening symptoms such as worsening rash, pain, fever, chills, nausea, vomiting, abdominal pain, etc...   Reviewed expectations re: course of current medical issues. Questions answered. Outlined signs  and symptoms indicating need for more acute intervention. Patient verbalized understanding. After Visit Summary given.   [[id number]] [place], PA-C 02/14/20 [phone]

## 2020-02-14 NOTE — ED Triage Notes (Signed)
Rash to LT ankle x 2 weeks after trimming some bushes

## 2020-02-19 ENCOUNTER — Other Ambulatory Visit: Payer: Self-pay

## 2020-02-19 ENCOUNTER — Encounter: Payer: Self-pay | Admitting: Emergency Medicine

## 2020-02-19 ENCOUNTER — Ambulatory Visit
Admission: EM | Admit: 2020-02-19 | Discharge: 2020-02-19 | Disposition: A | Discharge status: Home / Self Care | Payer: 59 | Attending: Emergency Medicine | Admitting: Emergency Medicine

## 2020-02-19 DIAGNOSIS — R05 Cough: Secondary | ICD-10-CM

## 2020-02-19 DIAGNOSIS — Z20822 Contact with and (suspected) exposure to covid-19: Secondary | ICD-10-CM

## 2020-02-19 DIAGNOSIS — Z23 Encounter for immunization: Secondary | ICD-10-CM

## 2020-02-19 DIAGNOSIS — R059 Cough, unspecified: Secondary | ICD-10-CM

## 2020-02-19 DIAGNOSIS — J069 Acute upper respiratory infection, unspecified: Secondary | ICD-10-CM

## 2020-02-19 MED ORDER — BENZONATATE 100 MG PO CAPS: 100.0000 mg | ORAL_CAPSULE | Freq: Three times a day (TID) | ORAL | 0 refills | Status: DC

## 2020-02-19 NOTE — Discharge Instructions (Signed)

## 2020-02-19 NOTE — ED Provider Notes (Signed)
[facility]   [id number] 02/19/20 Arrival Time: 1730   CC: COVID symptoms  SUBJECTIVE: History from: patient.  KHALIQ TURAY is a 59 y.o. male who presents with fatigue, subjective fever, runny nose, nasal congestion, body aches and mild cough x 4 days.  Denies sick exposure to COVID, flu or strep.  Has tried OTC medications without relief.  Worse with laying flat.  Reports/ denies previous symptoms in the past.   Denies fever, chills, SOB, wheezing, chest pain, nausea, changes in bowel or bladder habits.    ROS: As per HPI.  All other pertinent ROS negative.     Past Medical History:  Diagnosis Date   Allergy    allergic rhinitis   Diabetes mellitus    Elevated LFTs    Hyperlipidemia    Hypertension    Obesity    History reviewed. No pertinent surgical history. No Known Allergies No current facility-administered medications on file prior to encounter.   Current Outpatient Medications on File Prior to Encounter  Medication Sig Dispense Refill   aspirin 81 MG tablet Take 81 mg by mouth at bedtime.      atorvastatin (LIPITOR) 20 MG tablet TAKE 1 TABLET BY MOUTH ONCE A DAY. 90 tablet 3   cetirizine (ZYRTEC) 10 MG tablet Take 1 tablet (10 mg total) by mouth daily. 30 tablet 0   cholecalciferol (VITAMIN D) 1000 UNITS tablet Take 1,000 Units by mouth at bedtime.       ciclopirox (LOPROX) 0.77 % cream Apply topically 2 (two) times daily. 30 g 0   Cod Liver Oil 1000 MG CAPS Take 1 capsule by mouth daily.     cyclobenzaprine (FLEXERIL) 5 MG tablet Take 1 tablet (5 mg total) by mouth 3 (three) times daily as needed for muscle spasms. 15 tablet 0   fish oil-omega-3 fatty acids 1000 MG capsule Take 1 g by mouth 2 (two) times daily. 1 in am, 2 in pm     fluticasone (FLONASE) 50 MCG/ACT nasal spray Place 2 sprays into both nostrils daily. 16 g 0   glucose blood test strip Check BS bid and prn  - Pt uses one touch ultra meter 100 each 12   ibuprofen (ADVIL) 600  MG tablet Take 1 tablet (600 mg total) by mouth every 6 (six) hours as needed. 30 tablet 0   JARDIANCE 25 MG TABS tablet TAKE 1 TABLET BY MOUTH ONCE A DAY. 90 tablet 3   losartan (COZAAR) 50 MG tablet TAKE ONE TABLET BY MOUTH ONCE DAILY. 90 tablet 3   meloxicam (MOBIC) 15 MG tablet Take 1 tablet (15 mg total) by mouth daily. 30 tablet 1   metFORMIN (GLUCOPHAGE) 1000 MG tablet Take 1 tablet by mouth twice daily 180 tablet 3   Multiple Vitamins-Minerals (MULTIVITAMIN PO) Take 1 tablet by mouth daily.     pioglitazone (ACTOS) 30 MG tablet TAKE 1 TABLET EVERY DAY. 30 tablet 2   pyridOXINE (VITAMIN B-6) 100 MG tablet Take 100 mg by mouth daily.       vitamin C (ASCORBIC ACID) 500 MG tablet Take 500 mg by mouth at bedtime.       Social History   Socioeconomic History   Marital status: Married    Spouse name: Not on file   Number of children: Not on file   Years of education: Not on file   Highest education level: Not on file  Occupational History   Not on file  Tobacco Use   Smoking status:  Never Smoker   Smokeless tobacco: Current User    Types: Chew  Substance and Sexual Activity   Alcohol use: No   Drug use: No    Comment: Used "cocaine" as young adult   Sexual activity: Not on file  Other Topics Concern   Not on file  Social History Narrative   Not on file   Social Determinants of Health   Financial Resource Strain:    Difficulty of Paying Living Expenses: Not on file  Food Insecurity:    Worried About Programme researcher, broadcasting/film/video in the Last Year: Not on file   [employer] of Food in the Last Year: Not on file  Transportation Needs:    Lack of Transportation (Medical): Not on file   Lack of Transportation (Non-Medical): Not on file  Physical Activity:    Days of Exercise per Week: Not on file   Minutes of Exercise per Session: Not on file  Stress:    Feeling of Stress : Not on file  Social Connections:    Frequency of Communication with Friends and  Family: Not on file   Frequency of Social Gatherings with Friends and Family: Not on file   Attends Religious Services: Not on file   Active Member of Clubs or Organizations: Not on file   Attends Banker Meetings: Not on file   Marital Status: Not on file  Intimate Partner Violence:    Fear of Current or Ex-Partner: Not on file   Emotionally Abused: Not on file   Physically Abused: Not on file   Sexually Abused: Not on file   Family History  Problem Relation Age of Onset   Healthy Mother    Hyperlipidemia Father    Hypertension Father    Heart disease Father    Alcohol abuse Father     OBJECTIVE:  Vitals:   02/19/20 1746 02/19/20 1747  BP: 138/72   Pulse: 81   Resp: 18   Temp: 98.3 F (36.8 C)   TempSrc: Oral   SpO2: 96%   Weight:  204 lb (92.5 kg)  Height:  5\' 8"  (1.727 m)    General appearance: alert; appears fatigued, but nontoxic; speaking in full sentences and tolerating own secretions HEENT: NCAT; Ears: EACs clear, TMs pearly gray; Eyes: PERRL.  EOM grossly intact. Nose: nares patent without rhinorrhea, Throat: oropharynx clear, tonsils non erythematous or enlarged, uvula midline  Neck: supple without LAD Lungs: unlabored respirations, symmetrical air entry; cough: mild; no respiratory distress; CTAB Heart: regular rate and rhythm.   Skin: warm and dry Psychological: alert and cooperative; normal mood and affect  ASSESSMENT & PLAN:  1. Cough   2. Viral URI with cough   3. Suspected COVID-19 virus infection     Meds ordered this encounter  Medications   benzonatate (TESSALON) 100 MG capsule    Sig: Take 1 capsule (100 mg total) by mouth every 8 (eight) hours.    Dispense:  21 capsule    Refill:  0    Order Specific Question:   Supervising Provider    Answer:   Eustace Moore   COVID testing ordered.  It will take between 5-7 days for test results.  Someone will contact you regarding abnormal results.    In  the meantime: You should remain isolated in your home for 10 days from symptom onset AND greater than 72 hours after symptoms resolution (absence of fever without the use of fever-reducing medication and improvement in respiratory  symptoms), whichever is longer Get plenty of rest and push fluids Tessalon Perles prescribed for cough Use OTC zyrtec for nasal congestion, runny nose, and/or sore throat Use OTC flonase for nasal congestion and runny nose Use medications daily for symptom relief Use OTC medications like ibuprofen or tylenol as needed fever or pain Call or go to the ED if you have any new or worsening symptoms such as fever, worsening cough, shortness of breath, chest tightness, chest pain, turning blue, changes in mental status, etc...   Reviewed expectations re: course of current medical issues. Questions answered. Outlined signs and symptoms indicating need for more acute intervention. Patient verbalized understanding. After Visit Summary given.         Rennis Harding, PA-C 02/19/20 1806

## 2020-02-19 NOTE — ED Triage Notes (Addendum)
Runny nose, nasal congestion, eyes are aching, body aches and mild cough.

## 2020-02-21 ENCOUNTER — Other Ambulatory Visit: Payer: Self-pay | Admitting: Oncology

## 2020-02-21 ENCOUNTER — Encounter: Payer: Self-pay | Admitting: Oncology

## 2020-02-21 DIAGNOSIS — U071 COVID-19: Secondary | ICD-10-CM

## 2020-02-21 LAB — NOVEL CORONAVIRUS, NAA: SARS-CoV-2, NAA: DETECTED — AB

## 2020-02-21 LAB — SARS-COV-2, NAA 2 DAY TAT

## 2020-02-21 NOTE — Progress Notes (Signed)
I connected by phone with  Kyle Ray to discuss the potential use of an new treatment for mild to moderate COVID-19 viral infection in non-hospitalized patients.   This patient is a age/sex that meets the FDA criteria for Emergency Use Authorization of casirivimab\imdevimab.  Has a (+) direct SARS-CoV-2 viral test result 1. Has mild or moderate COVID-19  2. Is ? 59 years of age and weighs ? 40 kg 3. Is NOT hospitalized due to COVID-19 4. Is NOT requiring oxygen therapy or requiring an increase in baseline oxygen flow rate due to COVID-19 5. Is within 10 days of symptom onset 6. Has at least one of the high risk factor(s) for progression to severe COVID-19 and/or hospitalization as defined in EUA. Specific high risk criteria : Past Medical History:  Diagnosis Date  . Allergy    allergic rhinitis  . Diabetes mellitus   . Elevated LFTs   . Hyperlipidemia   . Hypertension   . Obesity   ?  ?    Symptom onset  02/15/20   I have spoken and communicated the following to the patient or parent/caregiver:   1. FDA has authorized the emergency use of casirivimab\imdevimab for the treatment of mild to moderate COVID-19 in adults and pediatric patients with positive results of direct SARS-CoV-2 viral testing who are 23 years of age and older weighing at least 40 kg, and who are at high risk for progressing to severe COVID-19 and/or hospitalization.   2. The significant known and potential risks and benefits of casirivimab\imdevimab, and the extent to which such potential risks and benefits are unknown.   3. Information on available alternative treatments and the risks and benefits of those alternatives, including clinical trials.   4. Patients treated with casirivimab\imdevimab should continue to self-isolate and use infection control measures (e.g., wear mask, isolate, social distance, avoid sharing personal items, clean and disinfect "high touch" surfaces, and frequent handwashing) according to  CDC guidelines.    5. The patient or parent/caregiver has the option to accept or refuse casirivimab\imdevimab .   After reviewing this information with the patient, The patient agreed to proceed with receiving casirivimab\imdevimab infusion and will be provided a copy of the Fact sheet prior to receiving the infusion.Mignon Pine, AGNP-C [phone] (Infusion Center Hotline)

## 2020-02-22 ENCOUNTER — Ambulatory Visit (HOSPITAL_COMMUNITY)
Admission: RE | Admit: 2020-02-22 | Discharge: 2020-02-22 | Disposition: A | Discharge status: Home / Self Care | Payer: 59 | Source: Ambulatory Visit | Attending: Pulmonary Disease | Admitting: Pulmonary Disease

## 2020-02-22 ENCOUNTER — Telehealth: Payer: Self-pay | Admitting: Family Medicine

## 2020-02-22 DIAGNOSIS — U071 COVID-19: Secondary | ICD-10-CM | POA: Diagnosis present

## 2020-02-22 MED ORDER — FAMOTIDINE IN NACL 20-0.9 MG/50ML-% IV SOLN: 20.0000 mg | Freq: Once | INTRAVENOUS | Status: DC | PRN

## 2020-02-22 MED ORDER — ALBUTEROL SULFATE HFA 108 (90 BASE) MCG/ACT IN AERS: 2.0000 | INHALATION_SPRAY | Freq: Once | RESPIRATORY_TRACT | Status: DC | PRN

## 2020-02-22 MED ORDER — SODIUM CHLORIDE 0.9 % IV SOLN: INTRAVENOUS | Status: DC | PRN

## 2020-02-22 MED ORDER — METHYLPREDNISOLONE SODIUM SUCC 125 MG IJ SOLR: 125.0000 mg | Freq: Once | INTRAMUSCULAR | Status: DC | PRN

## 2020-02-22 MED ORDER — EPINEPHRINE 0.3 MG/0.3ML IJ SOAJ: 0.3000 mg | Freq: Once | INTRAMUSCULAR | Status: DC | PRN

## 2020-02-22 MED ORDER — DIPHENHYDRAMINE HCL 50 MG/ML IJ SOLN: 50.0000 mg | Freq: Once | INTRAMUSCULAR | Status: DC | PRN

## 2020-02-22 MED ORDER — SODIUM CHLORIDE 0.9 % IV SOLN
1200.0000 mg | Freq: Once | INTRAVENOUS | Status: AC
  Administered 2020-02-22: 1200 mg via INTRAVENOUS

## 2020-02-22 NOTE — Discharge Instructions (Signed)

## 2020-02-22 NOTE — Progress Notes (Signed)
  Diagnosis: COVID-19  Physician: Dr. Wright  Procedure: [facility] Infusion [facility] Med: casirivimab\imdevimab infusion - Provided patient with casirivimab\imdevimab fact sheet for patients, parents and caregivers prior to infusion.  Complications: No immediate complications noted.  Discharge: Discharged home   Kyle Ray 02/22/2020  

## 2020-02-22 NOTE — Telephone Encounter (Signed)
MD to be made aware.  

## 2020-02-22 NOTE — Telephone Encounter (Signed)
Patient called to let us know that he tested positive for Covid on Monday and he was able to get the infusion today.   He states that he went to work over the weekend thought he had a sinus infection and went to UC on Monday and was tested.  CB# [phone]

## 2020-03-20 ENCOUNTER — Ambulatory Visit: Payer: 59

## 2020-03-26 ENCOUNTER — Other Ambulatory Visit: Payer: Self-pay

## 2020-03-26 ENCOUNTER — Ambulatory Visit
Admission: EM | Admit: 2020-03-26 | Discharge: 2020-03-26 | Disposition: A | Discharge status: Home / Self Care | Payer: 59 | Attending: Emergency Medicine | Admitting: Emergency Medicine

## 2020-03-26 ENCOUNTER — Ambulatory Visit (INDEPENDENT_AMBULATORY_CARE_PROVIDER_SITE_OTHER): Payer: PRIVATE HEALTH INSURANCE

## 2020-03-26 DIAGNOSIS — R059 Cough, unspecified: Secondary | ICD-10-CM

## 2020-03-26 DIAGNOSIS — J028 Acute pharyngitis due to other specified organisms: Secondary | ICD-10-CM | POA: Diagnosis not present

## 2020-03-26 DIAGNOSIS — B9789 Other viral agents as the cause of diseases classified elsewhere: Secondary | ICD-10-CM

## 2020-03-26 MED ORDER — PREDNISONE 10 MG PO TABS: 20.0000 mg | ORAL_TABLET | Freq: Every day | ORAL | 0 refills | Status: DC

## 2020-03-26 MED ORDER — BENZONATATE 100 MG PO CAPS: 100.0000 mg | ORAL_CAPSULE | Freq: Three times a day (TID) | ORAL | 0 refills | Status: DC

## 2020-03-26 NOTE — ED Triage Notes (Signed)
Pt presents with cough and sore throat that began on Saturday, pt 3 weeks post covid infusion

## 2020-03-26 NOTE — ED Provider Notes (Signed)
[facility]   [id number] 03/26/20 Arrival Time: 1808   Chief Complaint  Patient presents with  . Cough  . Sore Throat     SUBJECTIVE: History from: patient.  Kyle Ray is a 59 y.o. male presents to urgent care for complaint of cough, sore throat that started this past.  States he has tested positive for Covid 3 weeks ago.  Denies sick exposure to COVID, flu or strep.  Denies recent travel.  Has tried OTC medication without relief.  Denies review aggravating factors.  Denies previous symptoms in the past.   Denies fever, chills, fatigue, sinus pain, rhinorrhea, SOB, wheezing, chest pain, nausea, changes in bowel or bladder habits.    ROS: As per HPI.  All other pertinent ROS negative.      Past Medical History:  Diagnosis Date  . Allergy    allergic rhinitis  . Diabetes mellitus   . Elevated LFTs   . Hyperlipidemia   . Hypertension   . Obesity    History reviewed. No pertinent surgical history. No Known Allergies No current facility-administered medications on file prior to encounter.   Current Outpatient Medications on File Prior to Encounter  Medication Sig Dispense Refill  . aspirin 81 MG tablet Take 81 mg by mouth at bedtime.     Marland Kitchen atorvastatin (LIPITOR) 20 MG tablet TAKE 1 TABLET BY MOUTH ONCE A DAY. 90 tablet 3  . cetirizine (ZYRTEC) 10 MG tablet Take 1 tablet (10 mg total) by mouth daily. 30 tablet 0  . cholecalciferol (VITAMIN D) 1000 UNITS tablet Take 1,000 Units by mouth at bedtime.      . ciclopirox (LOPROX) 0.77 % cream Apply topically 2 (two) times daily. 30 g 0  . Cod Liver Oil 1000 MG CAPS Take 1 capsule by mouth daily.    . cyclobenzaprine (FLEXERIL) 5 MG tablet Take 1 tablet (5 mg total) by mouth 3 (three) times daily as needed for muscle spasms. 15 tablet 0  . fish oil-omega-3 fatty acids 1000 MG capsule Take 1 g by mouth 2 (two) times daily. 1 in am, 2 in pm    . fluticasone (FLONASE) 50 MCG/ACT nasal spray Place 2 sprays into both  nostrils daily. 16 g 0  . glucose blood test strip Check BS bid and prn  - Pt uses one touch ultra meter 100 each 12  . ibuprofen (ADVIL) 600 MG tablet Take 1 tablet (600 mg total) by mouth every 6 (six) hours as needed. 30 tablet 0  . JARDIANCE 25 MG TABS tablet TAKE 1 TABLET BY MOUTH ONCE A DAY. 90 tablet 3  . losartan (COZAAR) 50 MG tablet TAKE ONE TABLET BY MOUTH ONCE DAILY. 90 tablet 3  . meloxicam (MOBIC) 15 MG tablet Take 1 tablet (15 mg total) by mouth daily. 30 tablet 1  . metFORMIN (GLUCOPHAGE) 1000 MG tablet Take 1 tablet by mouth twice daily 180 tablet 3  . Multiple Vitamins-Minerals (MULTIVITAMIN PO) Take 1 tablet by mouth daily.    . pioglitazone (ACTOS) 30 MG tablet TAKE 1 TABLET EVERY DAY. 30 tablet 2  . pyridOXINE (VITAMIN B-6) 100 MG tablet Take 100 mg by mouth daily.      . vitamin C (ASCORBIC ACID) 500 MG tablet Take 500 mg by mouth at bedtime.       Social History   Socioeconomic History  . Marital status: Married    Spouse name: Not on file  . Number of children: Not on file  . Years  of education: Not on file  . Highest education level: Not on file  Occupational History  . Not on file  Tobacco Use  . Smoking status: Never Smoker  . Smokeless tobacco: Current User    Types: Chew  Substance and Sexual Activity  . Alcohol use: No  . Drug use: No    Comment: Used "cocaine" as young adult  . Sexual activity: Not on file  Other Topics Concern  . Not on file  Social History Narrative  . Not on file   Social Determinants of Health   Financial Resource Strain:   . Difficulty of Paying Living Expenses: Not on file  Food Insecurity:   . Worried About Programme researcher, broadcasting/film/video in the Last Year: Not on file  . Ran Out of Food in the Last Year: Not on file  Transportation Needs:   . Lack of Transportation (Medical): Not on file  . Lack of Transportation (Non-Medical): Not on file  Physical Activity:   . Days of Exercise per Week: Not on file  . Minutes of Exercise  per Session: Not on file  Stress:   . Feeling of Stress : Not on file  Social Connections:   . Frequency of Communication with Friends and Family: Not on file  . Frequency of Social Gatherings with Friends and Family: Not on file  . Attends Religious Services: Not on file  . Active Member of Clubs or Organizations: Not on file  . Attends Banker Meetings: Not on file  . Marital Status: Not on file  Intimate Partner Violence:   . Fear of Current or Ex-Partner: Not on file  . Emotionally Abused: Not on file  . Physically Abused: Not on file  . Sexually Abused: Not on file   Family History  Problem Relation Age of Onset  . Healthy Mother   . Hyperlipidemia Father   . Hypertension Father   . Heart disease Father   . Alcohol abuse Father     OBJECTIVE:  Vitals:   03/26/20 1819  BP: 135/86  Pulse: 93  Resp: 20  Temp: 99.4 F (37.4 C)  SpO2: 96%     General appearance: alert; appears fatigued, but nontoxic; speaking in full sentences and tolerating own secretions HEENT: NCAT; Ears: EACs clear, TMs pearly gray; Eyes: PERRL.  EOM grossly intact. Sinuses: nontender; Nose: nares patent without rhinorrhea, Throat: oropharynx clear, tonsils non erythematous or enlarged, uvula midline  Neck: supple without LAD Lungs: unlabored respirations, symmetrical air entry; cough: moderate; no respiratory distress; CTAB Heart: regular rate and rhythm.  Radial pulses 2+ symmetrical bilaterally Skin: warm and dry Psychological: alert and cooperative; normal mood and affect  LABS:  No results found for this or any previous visit (from the past 24 hour(s)).   RADIOLOGY:  DG Chest 2 View  Result Date: 03/26/2020 CLINICAL DATA:  Cough.  History of COVID pneumonia. EXAM: CHEST - 2 VIEW COMPARISON:  November 29, 2018. FINDINGS: The heart size and mediastinal contours are within normal limits. Both lungs are clear. The visualized skeletal structures are unremarkable. IMPRESSION: No active  cardiopulmonary disease. Electronically Signed   By: Lupita Raider M.D.   On: 03/26/2020 18:43   Chest X-ray is negative cardiopulmonary disease.  I have reviewed the x-ray myself and the radiologist interpretation.  I am in agreement with the radiologist interpretation.  ASSESSMENT & PLAN:  1. Viral sore throat   2. Cough     Meds ordered this encounter  Medications  .  benzonatate (TESSALON) 100 MG capsule    Sig: Take 1 capsule (100 mg total) by mouth every 8 (eight) hours.    Dispense:  30 capsule    Refill:  0  . predniSONE (DELTASONE) 10 MG tablet    Sig: Take 2 tablets (20 mg total) by mouth daily.    Dispense:  15 tablet    Refill:  0    Discharge instructions  Get plenty of rest and push fluids Tessalon Perles prescribed for cough Decadron was prescribed Use medications daily for symptom relief Use OTC medications like ibuprofen or tylenol as needed fever or pain Call or go to the ED if you have any new or worsening symptoms such as fever, worsening cough, shortness of breath, chest tightness, chest pain, turning blue, changes in mental status, etc...   Reviewed expectations re: course of current medical issues. Questions answered. Outlined signs and symptoms indicating need for more acute intervention. Patient verbalized understanding. After Visit Summary given.         Durward Parcel, FNP 03/26/20 1850

## 2020-03-26 NOTE — Discharge Instructions (Addendum)
Get plenty of rest and push fluids Tessalon Perles prescribed for cough Decadron was prescribed Use medications daily for symptom relief Use OTC medications like ibuprofen or tylenol as needed fever or pain Call or go to the ED if you have any new or worsening symptoms such as fever, worsening cough, shortness of breath, chest tightness, chest pain, turning blue, changes in mental status, etc... 

## 2020-04-01 ENCOUNTER — Telehealth: Payer: Self-pay | Admitting: Family Medicine

## 2020-04-01 NOTE — Telephone Encounter (Signed)
CB# [id number]  can Dr.Pickard prescribe medication for his score throat .   [facility] - [place], [place] - [address]

## 2020-04-01 NOTE — Telephone Encounter (Signed)
Left Pt vm concerning his visit at [facility]

## 2020-04-01 NOTE — Telephone Encounter (Signed)
Patient was seen at an urgent care.

## 2020-04-01 NOTE — Telephone Encounter (Signed)
Message sent thru MyChart regarding sore throat

## 2020-04-05 ENCOUNTER — Other Ambulatory Visit: Payer: Self-pay

## 2020-04-05 ENCOUNTER — Ambulatory Visit: Payer: 59 | Admitting: Family Medicine

## 2020-04-05 VITALS — BP 140/80 | HR 101 | Temp 98.2°F | Wt 198.0 lb

## 2020-04-05 DIAGNOSIS — R059 Cough, unspecified: Secondary | ICD-10-CM

## 2020-04-05 MED ORDER — HYDROCOD POLST-CPM POLST ER 10-8 MG/5ML PO SUER
5.0000 mL | Freq: Two times a day (BID) | ORAL | 0 refills | Status: DC | PRN
Start: 2020-04-05 — End: 2020-09-03

## 2020-04-05 NOTE — Progress Notes (Signed)
Subjective:    Patient ID: Kyle Ray, male    DOB: 1960-11-16, 59 y.o.   MRN: [id number]  Patient had covid in September. Patient was seen in urgent care on November 2 due to a cough and sore throat. Chest x-ray was clear. He was given Jerilynn Som for cough and diagnosed with a viral upper respiratory infection. He is here today complaining of a sore throat and a persistent cough. He denies any fevers or chills. He denies any chest pain or shortness of breath. He denies any purulent sputum or hemoptysis. He does have a nagging, tickle-like cough that will not subside particularly at night. He is coughed so much that it has aggravated his throat and is causing some pain with swallowing. He denies any hematemesis or hemoptysis. There is no lymphadenopathy in the neck Past Medical History:  Diagnosis Date  . Allergy    allergic rhinitis  . Diabetes mellitus   . Elevated LFTs   . Hyperlipidemia   . Hypertension   . Obesity    No past surgical history on file. Current Outpatient Medications on File Prior to Visit  Medication Sig Dispense Refill  . aspirin 81 MG tablet Take 81 mg by mouth at bedtime.     Marland Kitchen atorvastatin (LIPITOR) 20 MG tablet TAKE 1 TABLET BY MOUTH ONCE A DAY. 90 tablet 3  . benzonatate (TESSALON) 100 MG capsule Take 1 capsule (100 mg total) by mouth every 8 (eight) hours. 30 capsule 0  . cetirizine (ZYRTEC) 10 MG tablet Take 1 tablet (10 mg total) by mouth daily. 30 tablet 0  . cholecalciferol (VITAMIN D) 1000 UNITS tablet Take 1,000 Units by mouth at bedtime.      . ciclopirox (LOPROX) 0.77 % cream Apply topically 2 (two) times daily. 30 g 0  . Cod Liver Oil 1000 MG CAPS Take 1 capsule by mouth daily.    . cyclobenzaprine (FLEXERIL) 5 MG tablet Take 1 tablet (5 mg total) by mouth 3 (three) times daily as needed for muscle spasms. 15 tablet 0  . fish oil-omega-3 fatty acids 1000 MG capsule Take 1 g by mouth 2 (two) times daily. 1 in am, 2 in pm    . fluticasone  (FLONASE) 50 MCG/ACT nasal spray Place 2 sprays into both nostrils daily. 16 g 0  . glucose blood test strip Check BS bid and prn  - Pt uses one touch ultra meter 100 each 12  . ibuprofen (ADVIL) 600 MG tablet Take 1 tablet (600 mg total) by mouth every 6 (six) hours as needed. 30 tablet 0  . JARDIANCE 25 MG TABS tablet TAKE 1 TABLET BY MOUTH ONCE A DAY. 90 tablet 3  . losartan (COZAAR) 50 MG tablet TAKE ONE TABLET BY MOUTH ONCE DAILY. 90 tablet 3  . meloxicam (MOBIC) 15 MG tablet Take 1 tablet (15 mg total) by mouth daily. 30 tablet 1  . metFORMIN (GLUCOPHAGE) 1000 MG tablet Take 1 tablet by mouth twice daily 180 tablet 3  . Multiple Vitamins-Minerals (MULTIVITAMIN PO) Take 1 tablet by mouth daily.    . pioglitazone (ACTOS) 30 MG tablet TAKE 1 TABLET EVERY DAY. 30 tablet 2  . predniSONE (DELTASONE) 10 MG tablet Take 2 tablets (20 mg total) by mouth daily. 15 tablet 0  . pyridOXINE (VITAMIN B-6) 100 MG tablet Take 100 mg by mouth daily.      . vitamin C (ASCORBIC ACID) 500 MG tablet Take 500 mg by mouth at bedtime.  No current facility-administered medications on file prior to visit.    No Known Allergies Social History   Socioeconomic History  . Marital status: Married    Spouse name: Not on file  . Number of children: Not on file  . Years of education: Not on file  . Highest education level: Not on file  Occupational History  . Not on file  Tobacco Use  . Smoking status: Never Smoker  . Smokeless tobacco: Current User    Types: Chew  Substance and Sexual Activity  . Alcohol use: No  . Drug use: No    Comment: Used "cocaine" as young adult  . Sexual activity: Not on file  Other Topics Concern  . Not on file  Social History Narrative  . Not on file   Social Determinants of Health   Financial Resource Strain:   . Difficulty of Paying Living Expenses: Not on file  Food Insecurity:   . Worried About Programme researcher, broadcasting/film/video in the Last Year: Not on file  . Ran Out of Food  in the Last Year: Not on file  Transportation Needs:   . Lack of Transportation (Medical): Not on file  . Lack of Transportation (Non-Medical): Not on file  Physical Activity:   . Days of Exercise per Week: Not on file  . Minutes of Exercise per Session: Not on file  Stress:   . Feeling of Stress : Not on file  Social Connections:   . Frequency of Communication with Friends and Family: Not on file  . Frequency of Social Gatherings with Friends and Family: Not on file  . Attends Religious Services: Not on file  . Active Member of Clubs or Organizations: Not on file  . Attends Banker Meetings: Not on file  . Marital Status: Not on file  Intimate Partner Violence:   . Fear of Current or Ex-Partner: Not on file  . Emotionally Abused: Not on file  . Physically Abused: Not on file  . Sexually Abused: Not on file   Family History  Problem Relation Age of Onset  . Healthy Mother   . Hyperlipidemia Father   . Hypertension Father   . Heart disease Father   . Alcohol abuse Father       Review of Systems  All other systems reviewed and are negative.      Objective:   Physical Exam Vitals reviewed.  Constitutional:      General: He is not in acute distress.    Appearance: He is well-developed. He is not diaphoretic.  HENT:     Head: Normocephalic and atraumatic.     Right Ear: Tympanic membrane, ear canal and external ear normal. Tympanic membrane is not erythematous.     Left Ear: Tympanic membrane, ear canal and external ear normal. Tympanic membrane is not erythematous.     Nose: Nose normal. No congestion or rhinorrhea.     Mouth/Throat:     Pharynx: No oropharyngeal exudate or posterior oropharyngeal erythema.     Tonsils: No tonsillar exudate.  Eyes:     General: No scleral icterus.       Right eye: No discharge.        Left eye: No discharge.     Conjunctiva/sclera: Conjunctivae normal.     Pupils: Pupils are equal, round, and reactive to light.   Neck:     Thyroid: No thyromegaly.     Vascular: No JVD.     Trachea: No tracheal deviation.  Cardiovascular:     Rate and Rhythm: Normal rate and regular rhythm.     Heart sounds: Normal heart sounds. No murmur heard.  No friction rub. No gallop.   Pulmonary:     Effort: Pulmonary effort is normal. No respiratory distress.     Breath sounds: Normal breath sounds. No stridor. No wheezing or rales.  Abdominal:     General: Bowel sounds are normal. There is no distension.     Palpations: Abdomen is soft. There is no mass.     Tenderness: There is no abdominal tenderness. There is no guarding or rebound.  Genitourinary:    Penis: No tenderness.      Prostate: Normal.     Rectum: Normal. Guaiac result negative.  Musculoskeletal:        General: No tenderness. Normal range of motion.     Cervical back: Normal range of motion and neck supple.  Lymphadenopathy:     Cervical: No cervical adenopathy.  Skin:    General: Skin is warm and dry.     Coloration: Skin is not pale.     Findings: No erythema or rash.  Neurological:     Mental Status: He is alert and oriented to person, place, and time.     Cranial Nerves: No cranial nerve deficit.     Motor: No abnormal muscle tone.     Coordination: Coordination normal.     Deep Tendon Reflexes: Reflexes are normal and symmetric.  Psychiatric:        Behavior: Behavior normal.        Thought Content: Thought content normal.        Judgment: Judgment normal.   Patient is hoarse        Assessment & Plan:  Cough - Plan: CBC with Differential/Platelet, COMPLETE METABOLIC PANEL WITH GFR  I believe the patient have viral bronchitis. Symptoms are persistent. The symptoms are more of an aggravation than anything dangerous or life-threatening at this time. He reports a tickle cough that will not improve and a sore throat due to coughing. Therefore we will try to treat him symptomatically with Tussionex 1 teaspoon every 12 hours as needed to  see if we can stop the cough and alleviate the symptoms. Anticipate gradual improvement over the next week. He has lost substantial weight since September when he had Covid. He looks a little dehydrated to me today. Therefore I will check a CBC and a CMP. We may need to reduce some of his medication given the weight loss.

## 2020-04-06 LAB — COMPLETE METABOLIC PANEL WITH GFR
AG Ratio: 1.6 (calc) (ref 1.0–2.5)
ALT: 27 U/L (ref 9–46)
AST: 24 U/L (ref 10–35)
Albumin: 4 g/dL (ref 3.6–5.1)
Alkaline phosphatase (APISO): 90 U/L (ref 35–144)
BUN: 11 mg/dL (ref 7–25)
CO2: 28 mmol/L (ref 20–32)
Calcium: 9.5 mg/dL (ref 8.6–10.3)
Chloride: 99 mmol/L (ref 98–110)
Creat: 0.79 mg/dL (ref 0.70–1.33)
GFR, Est African American: 114 mL/min/{1.73_m2} (ref >=60)
GFR, Est Non African American: 98 mL/min/{1.73_m2} (ref >=60)
Globulin: 2.5 g/dL (calc) (ref 1.9–3.7)
Glucose, Bld: 149 mg/dL — ABNORMAL HIGH (ref 65–99)
Potassium: 4 mmol/L (ref 3.5–5.3)
Sodium: 140 mmol/L (ref 135–146)
Total Bilirubin: 0.5 mg/dL (ref 0.2–1.2)
Total Protein: 6.5 g/dL (ref 6.1–8.1)

## 2020-04-06 LAB — CBC WITH DIFFERENTIAL/PLATELET
Absolute Monocytes: 616 cells/uL (ref 200–950)
Basophils Absolute: 39 cells/uL (ref 0–200)
Basophils Relative: 0.5 %
Eosinophils Absolute: 242 cells/uL (ref 15–500)
Eosinophils Relative: 3.1 %
HCT: 42.4 % (ref 38.5–50.0)
Hemoglobin: 13.9 g/dL (ref 13.2–17.1)
Lymphs Abs: 1927 cells/uL (ref 850–3900)
MCH: 32 pg (ref 27.0–33.0)
MCHC: 32.8 g/dL (ref 32.0–36.0)
MCV: 97.5 fL (ref 80.0–100.0)
MPV: 8.8 fL (ref 7.5–12.5)
Monocytes Relative: 7.9 %
Neutro Abs: 4976 cells/uL (ref 1500–7800)
Neutrophils Relative %: 63.8 %
Platelets: 248 10*3/uL (ref 140–400)
RBC: 4.35 10*6/uL (ref 4.20–5.80)
RDW: 13.2 % (ref 11.0–15.0)
Total Lymphocyte: 24.7 %
WBC: 7.8 10*3/uL (ref 3.8–10.8)

## 2020-04-07 ENCOUNTER — Other Ambulatory Visit: Payer: Self-pay | Admitting: Family Medicine

## 2020-04-10 ENCOUNTER — Other Ambulatory Visit: Payer: Self-pay

## 2020-04-10 ENCOUNTER — Ambulatory Visit: Payer: 59

## 2020-04-11 ENCOUNTER — Other Ambulatory Visit: Payer: Self-pay | Admitting: Family Medicine

## 2020-05-09 ENCOUNTER — Other Ambulatory Visit: Payer: Self-pay | Admitting: Family Medicine

## 2020-06-03 ENCOUNTER — Other Ambulatory Visit: Payer: Self-pay | Admitting: Family Medicine

## 2020-06-27 ENCOUNTER — Telehealth: Payer: Self-pay | Admitting: Family Medicine

## 2020-06-27 NOTE — Telephone Encounter (Signed)
Call placed to patient. No answer. No VM.  

## 2020-06-27 NOTE — Telephone Encounter (Signed)
pt call would like to speak with the nurse about taking the Booster shot.

## 2020-06-28 ENCOUNTER — Ambulatory Visit (INDEPENDENT_AMBULATORY_CARE_PROVIDER_SITE_OTHER): Payer: 59 | Admitting: Family Medicine

## 2020-06-28 ENCOUNTER — Other Ambulatory Visit: Payer: Self-pay

## 2020-06-28 ENCOUNTER — Encounter: Payer: Self-pay | Admitting: Family Medicine

## 2020-06-28 VITALS — BP 138/80 | HR 85 | Temp 98.1°F | Ht 68.0 in | Wt 194.0 lb

## 2020-06-28 DIAGNOSIS — E785 Hyperlipidemia, unspecified: Secondary | ICD-10-CM

## 2020-06-28 DIAGNOSIS — E118 Type 2 diabetes mellitus with unspecified complications: Secondary | ICD-10-CM | POA: Diagnosis not present

## 2020-06-28 DIAGNOSIS — I10 Essential (primary) hypertension: Secondary | ICD-10-CM | POA: Diagnosis not present

## 2020-06-28 NOTE — Telephone Encounter (Signed)
Patient seen in office to discuss with PCP.

## 2020-06-28 NOTE — Progress Notes (Signed)
Subjective:    Patient ID: Kyle Ray, male    DOB: 1960/08/28, 60 y.o.   MRN: [id number]  Patient primarily came in today to ask if it was okay to get his Covid booster.  It has been almost a year since he had his Covid vaccine.  However he had COVID in September.  He received a monoclonal antibody infusion at that time however has been more than 90 days since he received the monoclonal antibody infusion.  Therefore there would be no contraindications to receiving his booster today.  He denies any chest pain or shortness of breath or dyspnea on exertion.  His blood pressure today is adequately controlled at 138/80.  His last A1c was 6.8 in September.  This is been more than 3 months.  Therefore he is due for an A1c.  I would like to check that today while the patient is already here since he made the trip in.  He denies any polyuria polydipsia or blurry vision.  He has not been checking his sugars.  He denies any symptoms of hypoglycemia. Past Medical History:  Diagnosis Date  . Allergy    allergic rhinitis  . Diabetes mellitus   . Elevated LFTs   . Hyperlipidemia   . Hypertension   . Obesity    No past surgical history on file. Current Outpatient Medications on File Prior to Visit  Medication Sig Dispense Refill  . aspirin 81 MG tablet Take 81 mg by mouth at bedtime.     Marland Kitchen atorvastatin (LIPITOR) 20 MG tablet TAKE 1 TABLET BY MOUTH ONCE A DAY. 90 tablet 3  . benzonatate (TESSALON) 100 MG capsule Take 1 capsule (100 mg total) by mouth every 8 (eight) hours. 30 capsule 0  . cetirizine (ZYRTEC) 10 MG tablet Take 1 tablet (10 mg total) by mouth daily. 30 tablet 0  . chlorpheniramine-HYDROcodone (TUSSIONEX PENNKINETIC ER) 10-8 MG/5ML SUER Take 5 mLs by mouth every 12 (twelve) hours as needed for cough. 140 mL 0  . cholecalciferol (VITAMIN D) 1000 UNITS tablet Take 1,000 Units by mouth at bedtime.      . ciclopirox (LOPROX) 0.77 % cream Apply topically 2 (two) times daily. 30 g 0  . Cod  Liver Oil 1000 MG CAPS Take 1 capsule by mouth daily.    . cyclobenzaprine (FLEXERIL) 5 MG tablet Take 1 tablet (5 mg total) by mouth 3 (three) times daily as needed for muscle spasms. 15 tablet 0  . fish oil-omega-3 fatty acids 1000 MG capsule Take 1 g by mouth 2 (two) times daily. 1 in am, 2 in pm    . fluticasone (FLONASE) 50 MCG/ACT nasal spray Place 2 sprays into both nostrils daily. 16 g 0  . glucose blood test strip Check BS bid and prn  - Pt uses one touch ultra meter 100 each 12  . ibuprofen (ADVIL) 600 MG tablet Take 1 tablet (600 mg total) by mouth every 6 (six) hours as needed. 30 tablet 0  . JARDIANCE 25 MG TABS tablet TAKE 1 TABLET BY MOUTH ONCE A DAY. 90 tablet 3  . losartan (COZAAR) 50 MG tablet TAKE ONE TABLET BY MOUTH ONCE DAILY. 30 tablet 11  . meloxicam (MOBIC) 15 MG tablet Take 1 tablet (15 mg total) by mouth daily. 30 tablet 1  . metFORMIN (GLUCOPHAGE) 1000 MG tablet Take 1 tablet by mouth twice daily 180 tablet 3  . Multiple Vitamins-Minerals (MULTIVITAMIN PO) Take 1 tablet by mouth daily.    Marland Kitchen  pioglitazone (ACTOS) 30 MG tablet TAKE 1 TABLET EVERY DAY. 30 tablet 0  . pyridOXINE (VITAMIN B-6) 100 MG tablet Take 100 mg by mouth daily.      . vitamin C (ASCORBIC ACID) 500 MG tablet Take 500 mg by mouth at bedtime.       No current facility-administered medications on file prior to visit.    No Known Allergies Social History   Socioeconomic History  . Marital status: Married    Spouse name: Not on file  . Number of children: Not on file  . Years of education: Not on file  . Highest education level: Not on file  Occupational History  . Not on file  Tobacco Use  . Smoking status: Never Smoker  . Smokeless tobacco: Current User    Types: Chew  Substance and Sexual Activity  . Alcohol use: No  . Drug use: No    Comment: Used "cocaine" as young adult  . Sexual activity: Not on file  Other Topics Concern  . Not on file  Social History Narrative  . Not on file    Social Determinants of Health   Financial Resource Strain: Not on file  Food Insecurity: Not on file  Transportation Needs: Not on file  Physical Activity: Not on file  Stress: Not on file  Social Connections: Not on file  Intimate Partner Violence: Not on file   Family History  Problem Relation Age of Onset  . Healthy Mother   . Hyperlipidemia Father   . Hypertension Father   . Heart disease Father   . Alcohol abuse Father       Review of Systems  All other systems reviewed and are negative.      Objective:   Physical Exam Vitals reviewed.  Constitutional:      General: He is not in acute distress.    Appearance: He is well-developed. He is not diaphoretic.  HENT:     Head: Normocephalic and atraumatic.     Right Ear: External ear normal.     Left Ear: External ear normal.     Nose: Nose normal.  Eyes:     General: No scleral icterus.       Right eye: No discharge.        Left eye: No discharge.     Conjunctiva/sclera: Conjunctivae normal.     Pupils: Pupils are equal, round, and reactive to light.  Neck:     Thyroid: No thyromegaly.     Vascular: No JVD.     Trachea: No tracheal deviation.  Cardiovascular:     Rate and Rhythm: Normal rate and regular rhythm.     Heart sounds: Normal heart sounds. No murmur heard. No friction rub. No gallop.   Pulmonary:     Effort: Pulmonary effort is normal. No respiratory distress.     Breath sounds: Normal breath sounds. No stridor. No wheezing or rales.  Abdominal:     General: Bowel sounds are normal. There is no distension.     Palpations: Abdomen is soft. There is no mass.     Tenderness: There is no abdominal tenderness. There is no guarding or rebound.  Genitourinary:    Penis: No tenderness.      Prostate: Normal.     Rectum: Normal. Guaiac result negative.  Musculoskeletal:        General: No tenderness. Normal range of motion.     Cervical back: Normal range of motion and neck supple.   Lymphadenopathy:  Cervical: No cervical adenopathy.  Skin:    General: Skin is warm and dry.     Coloration: Skin is not pale.     Findings: No erythema or rash.  Neurological:     Mental Status: He is alert and oriented to person, place, and time.     Cranial Nerves: No cranial nerve deficit.     Motor: No abnormal muscle tone.     Coordination: Coordination normal.     Deep Tendon Reflexes: Reflexes are normal and symmetric.  Psychiatric:        Behavior: Behavior normal.        Thought Content: Thought content normal.        Judgment: Judgment normal.           Assessment & Plan:  Diabetes mellitus type 2 with complications (HCC) - Plan: Hemoglobin A1c, CBC with Differential/Platelet, COMPLETE METABOLIC PANEL WITH GFR, Lipid panel, Microalbumin, urine  Essential hypertension  Hyperlipidemia, unspecified hyperlipidemia type  Blood pressure today is adequately controlled.  I will check a hemoglobin A1c.  Ideally I like his A1c to be below 6.5.  I will check a urine albumin to creatinine ratio which I would like to be below 30.  Check a fasting lipid panel.  Goal LDL cholesterol is less than 100.  I am primarily doing this today to save the patient a trip back as he is due for this at any time.  Regarding his specific question, I encouraged him to go ahead and get the Covid booster today as it has been more than 90 days since his monoclonal antibody infusion.  His PSA was checked in September and therefore is up-to-date however he is overdue for colonoscopy.  We discussed this.  He has received a Cologuard packet in the mail but he has not submitted it yet.  He plans to do this soon

## 2020-06-29 LAB — LIPID PANEL
Cholesterol: 98 mg/dL (ref <200)
HDL: 52 mg/dL (ref >=40)
LDL Cholesterol (Calc): 34 mg/dL (calc)
Non-HDL Cholesterol (Calc): 46 mg/dL (calc) (ref <130)
Total CHOL/HDL Ratio: 1.9 (calc) (ref <5.0)
Triglycerides: 43 mg/dL (ref <150)

## 2020-06-29 LAB — CBC WITH DIFFERENTIAL/PLATELET
Absolute Monocytes: 462 cells/uL (ref 200–950)
Basophils Absolute: 28 cells/uL (ref 0–200)
Basophils Relative: 0.4 %
Eosinophils Absolute: 138 cells/uL (ref 15–500)
Eosinophils Relative: 2 %
HCT: 42.5 % (ref 38.5–50.0)
Hemoglobin: 14.2 g/dL (ref 13.2–17.1)
Lymphs Abs: 1911 cells/uL (ref 850–3900)
MCH: 31.9 pg (ref 27.0–33.0)
MCHC: 33.4 g/dL (ref 32.0–36.0)
MCV: 95.5 fL (ref 80.0–100.0)
MPV: 9.5 fL (ref 7.5–12.5)
Monocytes Relative: 6.7 %
Neutro Abs: 4361 cells/uL (ref 1500–7800)
Neutrophils Relative %: 63.2 %
Platelets: 165 10*3/uL (ref 140–400)
RBC: 4.45 10*6/uL (ref 4.20–5.80)
RDW: 12.4 % (ref 11.0–15.0)
Total Lymphocyte: 27.7 %
WBC: 6.9 10*3/uL (ref 3.8–10.8)

## 2020-06-29 LAB — MICROALBUMIN, URINE: Microalb, Ur: 0.3 mg/dL

## 2020-06-29 LAB — COMPLETE METABOLIC PANEL WITH GFR
AG Ratio: 2 (calc) (ref 1.0–2.5)
ALT: 17 U/L (ref 9–46)
AST: 18 U/L (ref 10–35)
Albumin: 4.4 g/dL (ref 3.6–5.1)
Alkaline phosphatase (APISO): 75 U/L (ref 35–144)
BUN: 13 mg/dL (ref 7–25)
CO2: 30 mmol/L (ref 20–32)
Calcium: 9.7 mg/dL (ref 8.6–10.3)
Chloride: 104 mmol/L (ref 98–110)
Creat: 0.81 mg/dL (ref 0.70–1.33)
GFR, Est African American: 113 mL/min/{1.73_m2} (ref >=60)
GFR, Est Non African American: 97 mL/min/{1.73_m2} (ref >=60)
Globulin: 2.2 g/dL (calc) (ref 1.9–3.7)
Glucose, Bld: 130 mg/dL — ABNORMAL HIGH (ref 65–99)
Potassium: 4.1 mmol/L (ref 3.5–5.3)
Sodium: 141 mmol/L (ref 135–146)
Total Bilirubin: 0.7 mg/dL (ref 0.2–1.2)
Total Protein: 6.6 g/dL (ref 6.1–8.1)

## 2020-06-29 LAB — HEMOGLOBIN A1C
Hgb A1c MFr Bld: 6.6 % of total Hgb — ABNORMAL HIGH (ref <5.7)
Mean Plasma Glucose: 143 mg/dL
eAG (mmol/L): 7.9 mmol/L

## 2020-07-10 ENCOUNTER — Other Ambulatory Visit: Payer: Self-pay | Admitting: Family Medicine

## 2020-08-10 ENCOUNTER — Other Ambulatory Visit: Payer: Self-pay | Admitting: Family Medicine

## 2020-09-03 ENCOUNTER — Encounter: Payer: Self-pay | Admitting: Family Medicine

## 2020-09-03 ENCOUNTER — Other Ambulatory Visit: Payer: Self-pay

## 2020-09-03 ENCOUNTER — Ambulatory Visit: Payer: 59 | Admitting: Family Medicine

## 2020-09-03 VITALS — BP 136/88 | HR 82 | Temp 98.1°F | Resp 14 | Ht 68.0 in | Wt 190.0 lb

## 2020-09-03 DIAGNOSIS — J310 Chronic rhinitis: Secondary | ICD-10-CM

## 2020-09-03 DIAGNOSIS — J329 Chronic sinusitis, unspecified: Secondary | ICD-10-CM | POA: Diagnosis not present

## 2020-09-03 MED ORDER — LEVOCETIRIZINE DIHYDROCHLORIDE 5 MG PO TABS: 5.0000 mg | ORAL_TABLET | Freq: Every evening | ORAL | 0 refills | Status: AC

## 2020-09-03 MED ORDER — PIOGLITAZONE HCL 30 MG PO TABS
30.0000 mg | ORAL_TABLET | Freq: Every day | ORAL | 6 refills | Status: DC
Start: 2020-09-03 — End: 2021-03-18

## 2020-09-03 NOTE — Progress Notes (Signed)
Subjective:    Patient ID: Kyle Ray, male    DOB: 13-Sep-1960, 60 y.o.   MRN: [id number]  Patient states that he was mowing the yard yesterday.  Starting last night he developed a low-grade temperature subjectively.  He developed head congestion and rhinorrhea.  He reports postnasal drip and a nonproductive cough.  He denies any sinus pain although he does report sinus pressure.  He denies any sore throat.  He denies any otalgia. Past Medical History:  Diagnosis Date  . Allergy    allergic rhinitis  . Diabetes mellitus   . Elevated LFTs   . Hyperlipidemia   . Hypertension   . Obesity    History reviewed. No pertinent surgical history. Current Outpatient Medications on File Prior to Visit  Medication Sig Dispense Refill  . aspirin 81 MG tablet Take 81 mg by mouth at bedtime.    Marland Kitchen atorvastatin (LIPITOR) 20 MG tablet TAKE 1 TABLET BY MOUTH ONCE A DAY. 90 tablet 3  . Beta Carotene (VITAMIN A) 25000 UNIT capsule Take 25,000 Units by mouth daily.    . cetirizine (ZYRTEC) 10 MG tablet Take 1 tablet (10 mg total) by mouth daily. 30 tablet 0  . cholecalciferol (VITAMIN D) 1000 UNITS tablet Take 1,000 Units by mouth at bedtime.    Marland Kitchen Cod Liver Oil 1000 MG CAPS Take 1 capsule by mouth daily.    . fish oil-omega-3 fatty acids 1000 MG capsule Take 1 g by mouth 2 (two) times daily. 1 in am, 2 in pm    . fluticasone (FLONASE) 50 MCG/ACT nasal spray Place 2 sprays into both nostrils daily. 16 g 0  . glucose blood test strip Check BS bid and prn  - Pt uses one touch ultra meter 100 each 12  . ibuprofen (ADVIL) 600 MG tablet Take 1 tablet (600 mg total) by mouth every 6 (six) hours as needed. 30 tablet 0  . JARDIANCE 25 MG TABS tablet TAKE 1 TABLET BY MOUTH ONCE A DAY. 30 tablet 0  . losartan (COZAAR) 50 MG tablet TAKE ONE TABLET BY MOUTH ONCE DAILY. 30 tablet 11  . metFORMIN (GLUCOPHAGE) 1000 MG tablet Take 1 tablet by mouth twice daily 180 tablet 0  . Multiple Vitamins-Minerals (MULTIVITAMIN  PO) Take 1 tablet by mouth daily.    Marland Kitchen pyridOXINE (VITAMIN B-6) 100 MG tablet Take 100 mg by mouth daily.    . vitamin C (ASCORBIC ACID) 500 MG tablet Take 500 mg by mouth at bedtime.     No current facility-administered medications on file prior to visit.    No Known Allergies Social History   Socioeconomic History  . Marital status: Married    Spouse name: Not on file  . Number of children: Not on file  . Years of education: Not on file  . Highest education level: Not on file  Occupational History  . Not on file  Tobacco Use  . Smoking status: Never Smoker  . Smokeless tobacco: Current User    Types: Chew  Substance and Sexual Activity  . Alcohol use: No  . Drug use: No    Comment: Used "cocaine" as young adult  . Sexual activity: Not on file  Other Topics Concern  . Not on file  Social History Narrative  . Not on file   Social Determinants of Health   Financial Resource Strain: Not on file  Food Insecurity: Not on file  Transportation Needs: Not on file  Physical Activity: Not on file  Stress: Not on file  Social Connections: Not on file  Intimate Partner Violence: Not on file   Family History  Problem Relation Age of Onset  . Healthy Mother   . Hyperlipidemia Father   . Hypertension Father   . Heart disease Father   . Alcohol abuse Father       Review of Systems  All other systems reviewed and are negative.      Objective:   Physical Exam Vitals reviewed.  Constitutional:      General: He is not in acute distress.    Appearance: He is well-developed. He is not diaphoretic.  HENT:     Head: Normocephalic and atraumatic.     Right Ear: Tympanic membrane, ear canal and external ear normal. Tympanic membrane is not erythematous.     Left Ear: Tympanic membrane, ear canal and external ear normal. Tympanic membrane is not erythematous.     Nose: Congestion and rhinorrhea present.     Right Turbinates: Swollen.     Left Turbinates: Swollen.      Right Sinus: No maxillary sinus tenderness or frontal sinus tenderness.     Left Sinus: No maxillary sinus tenderness or frontal sinus tenderness.     Mouth/Throat:     Mouth: Mucous membranes are moist.     Pharynx: No oropharyngeal exudate or posterior oropharyngeal erythema.     Tonsils: No tonsillar exudate.  Eyes:     General: No scleral icterus.       Right eye: No discharge.        Left eye: No discharge.     Conjunctiva/sclera: Conjunctivae normal.     Pupils: Pupils are equal, round, and reactive to light.  Neck:     Thyroid: No thyromegaly.     Vascular: No JVD.     Trachea: No tracheal deviation.  Cardiovascular:     Rate and Rhythm: Normal rate and regular rhythm.     Heart sounds: Normal heart sounds. No murmur heard. No friction rub. No gallop.   Pulmonary:     Effort: Pulmonary effort is normal. No respiratory distress.     Breath sounds: Normal breath sounds. No stridor. No wheezing or rales.  Abdominal:     General: Bowel sounds are normal. There is no distension.     Palpations: Abdomen is soft. There is no mass.     Tenderness: There is no abdominal tenderness. There is no guarding or rebound.  Genitourinary:    Penis: No tenderness.      Prostate: Normal.     Rectum: Normal. Guaiac result negative.  Musculoskeletal:        General: No tenderness. Normal range of motion.     Cervical back: Normal range of motion and neck supple.  Lymphadenopathy:     Cervical: No cervical adenopathy.  Skin:    General: Skin is warm and dry.     Coloration: Skin is not pale.     Findings: No erythema or rash.  Neurological:     Mental Status: He is alert and oriented to person, place, and time.     Cranial Nerves: No cranial nerve deficit.     Motor: No abnormal muscle tone.     Coordination: Coordination normal.     Deep Tendon Reflexes: Reflexes are normal and symmetric.  Psychiatric:        Behavior: Behavior normal.        Thought Content: Thought content normal.  Judgment: Judgment normal.          Assessment & Plan:  Rhinosinusitis  I believe this is most likely allergies.  Although Zyrtec is on his medication list, he has not been taking it.  He is also not been taking the Flonase.  Therefore I recommended he start Xyzal 5 mg a day.  Stay off the Zyrtec.  And add Flonase 2 sprays each nostril daily.  Recheck if worsening

## 2020-09-05 ENCOUNTER — Telehealth: Payer: Self-pay | Admitting: *Deleted

## 2020-09-05 NOTE — Telephone Encounter (Signed)
Received call from patient.   Reports that he is having increased loose stools. Inquired if he can take Imodium.   Advised not to exceed 8mg  in 24 hours.   Advised if Sx worsen, or do not improve, contact office for evaluation.

## 2020-09-21 ENCOUNTER — Other Ambulatory Visit: Payer: Self-pay | Admitting: Family Medicine

## 2020-10-09 ENCOUNTER — Other Ambulatory Visit: Payer: Self-pay | Admitting: Family Medicine

## 2020-10-10 ENCOUNTER — Telehealth: Payer: Self-pay | Admitting: Family Medicine

## 2020-10-10 ENCOUNTER — Other Ambulatory Visit: Payer: Self-pay | Admitting: Family Medicine

## 2020-10-10 NOTE — Telephone Encounter (Signed)
Patient called to follow up on refill request from [facility] for JARDIANCE 25 MG TABS tablet [[id number]]    Patient requesting refill before the weekend. Please advise at [phone] or [phone] when Rx called in.

## 2020-10-10 NOTE — Telephone Encounter (Signed)
Prescription sent to pharmacy.

## 2020-10-20 ENCOUNTER — Other Ambulatory Visit: Payer: Self-pay | Admitting: Family Medicine

## 2020-11-07 ENCOUNTER — Encounter: Payer: Self-pay | Admitting: Family Medicine

## 2020-11-07 ENCOUNTER — Other Ambulatory Visit: Payer: Self-pay

## 2020-11-07 ENCOUNTER — Ambulatory Visit: Payer: 59 | Admitting: Family Medicine

## 2020-11-07 VITALS — BP 126/84 | HR 82 | Temp 98.1°F | Resp 16 | Ht 66.0 in | Wt 188.0 lb

## 2020-11-07 DIAGNOSIS — S30860A Insect bite (nonvenomous) of lower back and pelvis, initial encounter: Secondary | ICD-10-CM | POA: Diagnosis not present

## 2020-11-07 DIAGNOSIS — W57XXXA Bitten or stung by nonvenomous insect and other nonvenomous arthropods, initial encounter: Secondary | ICD-10-CM

## 2020-11-07 MED ORDER — DOXYCYCLINE HYCLATE 100 MG PO TABS: 100.0000 mg | ORAL_TABLET | Freq: Two times a day (BID) | ORAL | 0 refills | Status: DC

## 2020-11-07 MED ORDER — MOMETASONE FUROATE 0.1 % EX CREA: TOPICAL_CREAM | Freq: Two times a day (BID) | CUTANEOUS | 1 refills | Status: DC

## 2020-11-07 NOTE — Progress Notes (Signed)
Subjective:    Patient ID: Kyle Ray, male    DOB: 1960/06/24, 60 y.o.   MRN: [id number]  Patient was bitten by a tick 1 week ago in his right inguinal canal.  Today on exam, there is an erythematous ellipse raised plaque similar to a wheal.  It is extremely itchy.  Plaque is approximately 3 cm x 1 cm.  There is no central clearing.  There is no target shaped lesion.  There is no lymphadenopathy.  The area just itches and has been itching for a week.  The redness has not been growing or changing.  It is not tender.  He denies any fevers chills headaches or neck stiffness Past Medical History:  Diagnosis Date   Allergy    allergic rhinitis   Diabetes mellitus    Elevated LFTs    Hyperlipidemia    Hypertension    Obesity    History reviewed. No pertinent surgical history. Current Outpatient Medications on File Prior to Visit  Medication Sig Dispense Refill   aspirin 81 MG tablet Take 81 mg by mouth at bedtime.     atorvastatin (LIPITOR) 20 MG tablet TAKE 1 TABLET BY MOUTH ONCE A DAY. 90 tablet 3   Beta Carotene (VITAMIN A) 25000 UNIT capsule Take 25,000 Units by mouth daily.     cetirizine (ZYRTEC) 10 MG tablet Take 1 tablet (10 mg total) by mouth daily. 30 tablet 0   cholecalciferol (VITAMIN D) 1000 UNITS tablet Take 1,000 Units by mouth at bedtime.     Cod Liver Oil 1000 MG CAPS Take 1 capsule by mouth daily.     fish oil-omega-3 fatty acids 1000 MG capsule Take 1 g by mouth 2 (two) times daily. 1 in am, 2 in pm     fluticasone (FLONASE) 50 MCG/ACT nasal spray Place 2 sprays into both nostrils daily. 16 g 0   glucose blood test strip Check BS bid and prn  - Pt uses one touch ultra meter 100 each 12   ibuprofen (ADVIL) 600 MG tablet Take 1 tablet (600 mg total) by mouth every 6 (six) hours as needed. 30 tablet 0   JARDIANCE 25 MG TABS tablet TAKE 1 TABLET BY MOUTH ONCE A DAY. 30 tablet 6   levocetirizine (XYZAL) 5 MG tablet Take 1 tablet (5 mg total) by mouth every evening. 30  tablet 0   losartan (COZAAR) 50 MG tablet TAKE ONE TABLET BY MOUTH ONCE DAILY. 30 tablet 11   metFORMIN (GLUCOPHAGE) 1000 MG tablet Take 1 tablet by mouth twice daily 180 tablet 0   Multiple Vitamins-Minerals (MULTIVITAMIN PO) Take 1 tablet by mouth daily.     pioglitazone (ACTOS) 30 MG tablet Take 1 tablet (30 mg total) by mouth daily. 30 tablet 6   pyridOXINE (VITAMIN B-6) 100 MG tablet Take 100 mg by mouth daily.     vitamin C (ASCORBIC ACID) 500 MG tablet Take 500 mg by mouth at bedtime.     No current facility-administered medications on file prior to visit.    No Known Allergies Social History   Socioeconomic History   Marital status: Married    Spouse name: Not on file   Number of children: Not on file   Years of education: Not on file   Highest education level: Not on file  Occupational History   Not on file  Tobacco Use   Smoking status: Never   Smokeless tobacco: Current    Types: Chew  Substance and Sexual Activity  Alcohol use: No   Drug use: No    Comment: Used "cocaine" as young adult   Sexual activity: Not on file  Other Topics Concern   Not on file  Social History Narrative   Not on file   Social Determinants of Health   Financial Resource Strain: Not on file  Food Insecurity: Not on file  Transportation Needs: Not on file  Physical Activity: Not on file  Stress: Not on file  Social Connections: Not on file  Intimate Partner Violence: Not on file   Family History  Problem Relation Age of Onset   Healthy Mother    Hyperlipidemia Father    Hypertension Father    Heart disease Father    Alcohol abuse Father       Review of Systems  All other systems reviewed and are negative.     Objective:   Physical Exam Vitals reviewed.  Constitutional:      General: He is not in acute distress.    Appearance: He is well-developed. He is not diaphoretic.  HENT:     Head: Normocephalic and atraumatic.  Neck:     Thyroid: No thyromegaly.      Vascular: No JVD.     Trachea: No tracheal deviation.  Cardiovascular:     Rate and Rhythm: Normal rate and regular rhythm.     Heart sounds: Normal heart sounds. No murmur heard.   No friction rub. No gallop.  Pulmonary:     Effort: Pulmonary effort is normal. No respiratory distress.     Breath sounds: Normal breath sounds. No stridor. No wheezing or rales.  Abdominal:     General: Bowel sounds are normal. There is no distension.     Palpations: Abdomen is soft.     Tenderness: There is no abdominal tenderness.  Genitourinary:    Penis: No tenderness.      Rectum: Normal.  Musculoskeletal:        General: No tenderness. Normal range of motion.       Legs:  Skin:    General: Skin is warm and dry.     Coloration: Skin is not pale.     Findings: No erythema or rash.  Neurological:     Mental Status: He is alert and oriented to person, place, and time.     Cranial Nerves: No cranial nerve deficit.     Motor: No abnormal muscle tone.     Coordination: Coordination normal.     Deep Tendon Reflexes: Reflexes are normal and symmetric.  Psychiatric:        Behavior: Behavior normal.        Thought Content: Thought content normal.        Judgment: Judgment normal.          Assessment & Plan:  Tick bite of pelvic region, initial encounter I believe this is just a local allergic response to the tick bite.  I recommended applying Elocon cream twice daily for 1 week.  I did give him prescription for doxycycline.  If he develops fever or chills or headache or neck stiffness or a spreading ringlike rash around the initial bite site, I want him to start the antibiotics.  Otherwise simply use the steroid cream as prescribed

## 2020-12-24 ENCOUNTER — Other Ambulatory Visit: Payer: Self-pay

## 2020-12-24 ENCOUNTER — Other Ambulatory Visit: Payer: 59

## 2020-12-24 DIAGNOSIS — I10 Essential (primary) hypertension: Secondary | ICD-10-CM

## 2020-12-24 DIAGNOSIS — E785 Hyperlipidemia, unspecified: Secondary | ICD-10-CM

## 2020-12-24 DIAGNOSIS — Z13220 Encounter for screening for lipoid disorders: Secondary | ICD-10-CM

## 2020-12-24 DIAGNOSIS — E118 Type 2 diabetes mellitus with unspecified complications: Secondary | ICD-10-CM

## 2020-12-25 LAB — COMPLETE METABOLIC PANEL WITH GFR
AG Ratio: 2.1 (calc) (ref 1.0–2.5)
ALT: 17 U/L (ref 9–46)
AST: 19 U/L (ref 10–35)
Albumin: 4.6 g/dL (ref 3.6–5.1)
Alkaline phosphatase (APISO): 80 U/L (ref 35–144)
BUN: 15 mg/dL (ref 7–25)
CO2: 30 mmol/L (ref 20–32)
Calcium: 9.7 mg/dL (ref 8.6–10.3)
Chloride: 104 mmol/L (ref 98–110)
Creat: 0.95 mg/dL (ref 0.70–1.35)
Globulin: 2.2 g/dL (calc) (ref 1.9–3.7)
Glucose, Bld: 104 mg/dL — ABNORMAL HIGH (ref 65–99)
Potassium: 4.1 mmol/L (ref 3.5–5.3)
Sodium: 142 mmol/L (ref 135–146)
Total Bilirubin: 0.6 mg/dL (ref 0.2–1.2)
Total Protein: 6.8 g/dL (ref 6.1–8.1)
eGFR: 92 mL/min/{1.73_m2} (ref >=60)

## 2020-12-25 LAB — CBC WITH DIFFERENTIAL/PLATELET
Absolute Monocytes: 488 cells/uL (ref 200–950)
Basophils Absolute: 42 cells/uL (ref 0–200)
Basophils Relative: 0.8 %
Eosinophils Absolute: 212 cells/uL (ref 15–500)
Eosinophils Relative: 4 %
HCT: 43.3 % (ref 38.5–50.0)
Hemoglobin: 14.4 g/dL (ref 13.2–17.1)
Lymphs Abs: 2480 cells/uL (ref 850–3900)
MCH: 31.9 pg (ref 27.0–33.0)
MCHC: 33.3 g/dL (ref 32.0–36.0)
MCV: 95.8 fL (ref 80.0–100.0)
MPV: 9.5 fL (ref 7.5–12.5)
Monocytes Relative: 9.2 %
Neutro Abs: 2078 cells/uL (ref 1500–7800)
Neutrophils Relative %: 39.2 %
Platelets: 162 10*3/uL (ref 140–400)
RBC: 4.52 10*6/uL (ref 4.20–5.80)
RDW: 12.5 % (ref 11.0–15.0)
Total Lymphocyte: 46.8 %
WBC: 5.3 10*3/uL (ref 3.8–10.8)

## 2020-12-25 LAB — LIPID PANEL
Cholesterol: 112 mg/dL (ref <200)
HDL: 60 mg/dL (ref >=40)
LDL Cholesterol (Calc): 38 mg/dL (calc)
Non-HDL Cholesterol (Calc): 52 mg/dL (calc) (ref <130)
Total CHOL/HDL Ratio: 1.9 (calc) (ref <5.0)
Triglycerides: 59 mg/dL (ref <150)

## 2020-12-25 LAB — HEMOGLOBIN A1C
Hgb A1c MFr Bld: 6.3 % of total Hgb — ABNORMAL HIGH (ref <5.7)
Mean Plasma Glucose: 134 mg/dL
eAG (mmol/L): 7.4 mmol/L

## 2021-01-06 ENCOUNTER — Other Ambulatory Visit: Payer: Self-pay | Admitting: Family Medicine

## 2021-03-17 ENCOUNTER — Other Ambulatory Visit: Payer: Self-pay | Admitting: Family Medicine

## 2021-04-06 ENCOUNTER — Other Ambulatory Visit: Payer: Self-pay | Admitting: Family Medicine

## 2021-05-13 ENCOUNTER — Other Ambulatory Visit: Payer: Self-pay | Admitting: Family Medicine

## 2021-06-13 ENCOUNTER — Other Ambulatory Visit: Payer: Self-pay | Admitting: Family Medicine

## 2021-06-13 ENCOUNTER — Other Ambulatory Visit: Payer: Self-pay

## 2021-06-13 ENCOUNTER — Ambulatory Visit: Payer: PRIVATE HEALTH INSURANCE | Admitting: Family Medicine

## 2021-06-27 ENCOUNTER — Other Ambulatory Visit: Payer: Self-pay

## 2021-06-27 ENCOUNTER — Encounter: Payer: Self-pay | Admitting: Family Medicine

## 2021-06-27 ENCOUNTER — Ambulatory Visit (INDEPENDENT_AMBULATORY_CARE_PROVIDER_SITE_OTHER): Payer: 59 | Admitting: Family Medicine

## 2021-06-27 VITALS — BP 118/68 | HR 48 | Temp 97.5°F | Resp 18 | Ht 66.0 in | Wt 191.0 lb

## 2021-06-27 DIAGNOSIS — Z23 Encounter for immunization: Secondary | ICD-10-CM | POA: Diagnosis not present

## 2021-06-27 DIAGNOSIS — I10 Essential (primary) hypertension: Secondary | ICD-10-CM

## 2021-06-27 DIAGNOSIS — Z0000 Encounter for general adult medical examination without abnormal findings: Secondary | ICD-10-CM | POA: Diagnosis not present

## 2021-06-27 DIAGNOSIS — E118 Type 2 diabetes mellitus with unspecified complications: Secondary | ICD-10-CM

## 2021-06-27 DIAGNOSIS — Z125 Encounter for screening for malignant neoplasm of prostate: Secondary | ICD-10-CM | POA: Diagnosis not present

## 2021-06-27 DIAGNOSIS — Z1211 Encounter for screening for malignant neoplasm of colon: Secondary | ICD-10-CM | POA: Diagnosis not present

## 2021-06-27 DIAGNOSIS — Z0001 Encounter for general adult medical examination with abnormal findings: Secondary | ICD-10-CM

## 2021-06-27 NOTE — Progress Notes (Signed)
Subjective:    Patient ID: Kyle Ray, male    DOB: 1960/08/20, 61 y.o.   MRN: [id number] Patient is a 61 year old Caucasian gentleman here today for complete physical exam.  Past medical history is significant for diabetes, hypertension, hyperlipidemia.  Overall he is doing well with no concerns.  He is due for prostate cancer screening.  Therefore I will check a PSA.  He is due for colon cancer screening.  Therefore we will check Cologuard.  He declines colonoscopy.  He is due for Pneumovax 23 which he received today.  Blood pressures well controlled at 118/68.  Otherwise he is doing well with no chest pain shortness of breath dyspnea on exertion.  He denies any polyuria, polydipsia, blurry vision.  He denies any neuropathy in his feet. Past Medical History:  Diagnosis Date   Allergy    allergic rhinitis   Diabetes mellitus    Elevated LFTs    Hyperlipidemia    Hypertension    Obesity    History reviewed. No pertinent surgical history. Current Outpatient Medications on File Prior to Visit  Medication Sig Dispense Refill   aspirin 81 MG tablet Take 81 mg by mouth at bedtime.     atorvastatin (LIPITOR) 20 MG tablet TAKE 1 TABLET BY MOUTH ONCE A DAY. 90 tablet 3   Beta Carotene (VITAMIN A) 25000 UNIT capsule Take 25,000 Units by mouth daily.     cetirizine (ZYRTEC) 10 MG tablet Take 1 tablet (10 mg total) by mouth daily. 30 tablet 0   cholecalciferol (VITAMIN D) 1000 UNITS tablet Take 1,000 Units by mouth at bedtime.     Cod Liver Oil 1000 MG CAPS Take 1 capsule by mouth daily.     fish oil-omega-3 fatty acids 1000 MG capsule Take 1 g by mouth 2 (two) times daily. 1 in am, 2 in pm     fluticasone (FLONASE) 50 MCG/ACT nasal spray Place 2 sprays into both nostrils daily. 16 g 0   glucose blood test strip Check BS bid and prn  - Pt uses one touch ultra meter 100 each 12   ibuprofen (ADVIL) 600 MG tablet Take 1 tablet (600 mg total) by mouth every 6 (six) hours as needed. 30 tablet 0    JARDIANCE 25 MG TABS tablet TAKE 1 TABLET BY MOUTH ONCE A DAY. 30 tablet 0   levocetirizine (XYZAL) 5 MG tablet Take 1 tablet (5 mg total) by mouth every evening. 30 tablet 0   losartan (COZAAR) 50 MG tablet TAKE ONE TABLET BY MOUTH ONCE DAILY. 30 tablet 0   metFORMIN (GLUCOPHAGE) 1000 MG tablet Take 1 tablet by mouth twice daily 180 tablet 0   mometasone (ELOCON) 0.1 % cream Apply topically in the morning and at bedtime. 15 g 1   Multiple Vitamins-Minerals (MULTIVITAMIN PO) Take 1 tablet by mouth daily.     pioglitazone (ACTOS) 30 MG tablet TAKE 1 TABLET EVERY DAY. 30 tablet 0   pyridOXINE (VITAMIN B-6) 100 MG tablet Take 100 mg by mouth daily.     vitamin C (ASCORBIC ACID) 500 MG tablet Take 500 mg by mouth at bedtime.     No current facility-administered medications on file prior to visit.    No Known Allergies Social History   Socioeconomic History   Marital status: Married    Spouse name: Not on file   Number of children: Not on file   Years of education: Not on file   Highest education level: Not on file  Occupational History   Not on file  Tobacco Use   Smoking status: Never   Smokeless tobacco: Current    Types: Chew  Substance and Sexual Activity   Alcohol use: No   Drug use: No    Comment: Used "cocaine" as young adult   Sexual activity: Not on file  Other Topics Concern   Not on file  Social History Narrative   Not on file   Social Determinants of Health   Financial Resource Strain: Not on file  Food Insecurity: Not on file  Transportation Needs: Not on file  Physical Activity: Not on file  Stress: Not on file  Social Connections: Not on file  Intimate Partner Violence: Not on file   Family History  Problem Relation Age of Onset   Healthy Mother    Hyperlipidemia Father    Hypertension Father    Heart disease Father    Alcohol abuse Father       Review of Systems  All other systems reviewed and are negative.     Objective:   Physical  Exam Vitals reviewed.  Constitutional:      General: He is not in acute distress.    Appearance: Normal appearance. He is well-developed and normal weight. He is not ill-appearing, toxic-appearing or diaphoretic.  HENT:     Head: Normocephalic and atraumatic.     Right Ear: Tympanic membrane, ear canal and external ear normal.     Left Ear: Tympanic membrane, ear canal and external ear normal.     Nose: Nose normal.     Mouth/Throat:     Pharynx: No oropharyngeal exudate or posterior oropharyngeal erythema.  Eyes:     General: No scleral icterus.       Right eye: No discharge.        Left eye: No discharge.     Conjunctiva/sclera: Conjunctivae normal.     Pupils: Pupils are equal, round, and reactive to light.  Neck:     Thyroid: No thyromegaly.     Vascular: No carotid bruit or JVD.     Trachea: No tracheal deviation.  Cardiovascular:     Rate and Rhythm: Normal rate and regular rhythm.     Pulses: Normal pulses.     Heart sounds: Normal heart sounds. No murmur heard.   No friction rub. No gallop.  Pulmonary:     Effort: Pulmonary effort is normal. No respiratory distress.     Breath sounds: Normal breath sounds. No stridor. No wheezing or rales.  Abdominal:     General: Bowel sounds are normal. There is no distension.     Palpations: Abdomen is soft. There is no mass.     Tenderness: There is no abdominal tenderness. There is no guarding or rebound.  Genitourinary:    Penis: No tenderness.   Musculoskeletal:     Cervical back: Normal range of motion and neck supple. No rigidity or tenderness.     Right lower leg: No edema.     Left lower leg: No edema.  Lymphadenopathy:     Cervical: No cervical adenopathy.  Skin:    General: Skin is warm and dry.     Coloration: Skin is not jaundiced or pale.     Findings: No bruising, erythema, lesion or rash.  Neurological:     Mental Status: He is alert and oriented to person, place, and time.     Cranial Nerves: No cranial nerve  deficit.     Sensory: No sensory deficit.  Motor: No weakness or abnormal muscle tone.     Coordination: Coordination normal.     Gait: Gait normal.     Deep Tendon Reflexes: Reflexes are normal and symmetric. Reflexes normal.  Psychiatric:        Behavior: Behavior normal.        Thought Content: Thought content normal.        Judgment: Judgment normal.          Assessment & Plan:  Colon cancer screening - Plan: Cologuard  Diabetes mellitus type 2 with complications (HCC) - Plan: Hemoglobin A1c, CBC with Differential/Platelet, Lipid panel, Microalbumin, urine, COMPLETE METABOLIC PANEL WITH GFR  Essential hypertension  Prostate cancer screening - Plan: PSA  Need for pneumococcal vaccination - Plan: Pneumococcal polysaccharide vaccine 23-valent greater than or equal to 2yo subcutaneous/IM  General medical exam Patient's blood pressure today is well controlled at 118/68.  I will check a CBC, CMP, lipid panel and an A1c.  Goal A1c is less than 6.5.  Goal LDL cholesterol is less than 100.  I will check a urine microalbumin.  Goal albumin to creatinine ratio is less than 30.  Screen for prostate cancer with a PSA.  Schedule the patient for Cologuard to screen for colon cancer.  Reviewing his immunizations, his immunizations are up-to-date except for Pneumovax 23.  He received that booster today.  Recommended an annual diabetic eye exam.  Diabetic foot exam was normal

## 2021-06-28 LAB — CBC WITH DIFFERENTIAL/PLATELET
Absolute Monocytes: 403 cells/uL (ref 200–950)
Basophils Absolute: 29 cells/uL (ref 0–200)
Basophils Relative: 0.7 %
Eosinophils Absolute: 130 cells/uL (ref 15–500)
Eosinophils Relative: 3.1 %
HCT: 43.3 % (ref 38.5–50.0)
Hemoglobin: 14.4 g/dL (ref 13.2–17.1)
Lymphs Abs: 1949 cells/uL (ref 850–3900)
MCH: 32.1 pg (ref 27.0–33.0)
MCHC: 33.3 g/dL (ref 32.0–36.0)
MCV: 96.7 fL (ref 80.0–100.0)
MPV: 9.7 fL (ref 7.5–12.5)
Monocytes Relative: 9.6 %
Neutro Abs: 1688 cells/uL (ref 1500–7800)
Neutrophils Relative %: 40.2 %
Platelets: 149 10*3/uL (ref 140–400)
RBC: 4.48 10*6/uL (ref 4.20–5.80)
RDW: 13 % (ref 11.0–15.0)
Total Lymphocyte: 46.4 %
WBC: 4.2 10*3/uL (ref 3.8–10.8)

## 2021-06-28 LAB — COMPLETE METABOLIC PANEL WITH GFR
AG Ratio: 2.1 (calc) (ref 1.0–2.5)
ALT: 16 U/L (ref 9–46)
AST: 20 U/L (ref 10–35)
Albumin: 4.9 g/dL (ref 3.6–5.1)
Alkaline phosphatase (APISO): 73 U/L (ref 35–144)
BUN: 12 mg/dL (ref 7–25)
CO2: 28 mmol/L (ref 20–32)
Calcium: 10.3 mg/dL (ref 8.6–10.3)
Chloride: 102 mmol/L (ref 98–110)
Creat: 0.89 mg/dL (ref 0.70–1.35)
Globulin: 2.3 g/dL (calc) (ref 1.9–3.7)
Glucose, Bld: 106 mg/dL — ABNORMAL HIGH (ref 65–99)
Potassium: 4.1 mmol/L (ref 3.5–5.3)
Sodium: 141 mmol/L (ref 135–146)
Total Bilirubin: 1.1 mg/dL (ref 0.2–1.2)
Total Protein: 7.2 g/dL (ref 6.1–8.1)
eGFR: 98 mL/min/{1.73_m2} (ref >=60)

## 2021-06-28 LAB — HEMOGLOBIN A1C
Hgb A1c MFr Bld: 6.2 % of total Hgb — ABNORMAL HIGH (ref <5.7)
Mean Plasma Glucose: 131 mg/dL
eAG (mmol/L): 7.3 mmol/L

## 2021-06-28 LAB — MICROALBUMIN, URINE: Microalb, Ur: 0.5 mg/dL

## 2021-06-28 LAB — PSA: PSA: 0.25 ng/mL (ref <=4.00)

## 2021-06-28 LAB — LIPID PANEL
Cholesterol: 106 mg/dL (ref <200)
HDL: 57 mg/dL (ref >=40)
LDL Cholesterol (Calc): 36 mg/dL (calc)
Non-HDL Cholesterol (Calc): 49 mg/dL (calc) (ref <130)
Total CHOL/HDL Ratio: 1.9 (calc) (ref <5.0)
Triglycerides: 46 mg/dL (ref <150)

## 2021-07-08 ENCOUNTER — Other Ambulatory Visit: Payer: Self-pay | Admitting: Family Medicine

## 2021-07-14 ENCOUNTER — Telehealth: Payer: Self-pay | Admitting: Family Medicine

## 2021-07-16 NOTE — Telephone Encounter (Signed)
All recent refill requests have been filled, do not know which medication patient is asking about.   LVM to advise patient all refills have been completed.

## 2021-07-16 NOTE — Telephone Encounter (Signed)
Patient called to follow up on refill request submitted by pharmacy; completely out.   Please advise at [phone]

## 2021-07-31 ENCOUNTER — Encounter: Payer: Self-pay | Admitting: Family Medicine

## 2021-07-31 ENCOUNTER — Ambulatory Visit (INDEPENDENT_AMBULATORY_CARE_PROVIDER_SITE_OTHER): Payer: 59 | Admitting: Family Medicine

## 2021-07-31 ENCOUNTER — Other Ambulatory Visit: Payer: Self-pay

## 2021-07-31 VITALS — BP 118/68 | HR 60 | Temp 97.3°F | Resp 18 | Ht 66.0 in | Wt 191.0 lb

## 2021-07-31 NOTE — Progress Notes (Signed)
? ?Subjective:  ? ? Patient ID: Kyle Ray, male    DOB: 1961/01/12, 61 y.o.   MRN: [id number] ?Patient has a quarter sized purple bruise in his left axilla near the insertion of the pectoralis major. ?Past Medical History:  ?Diagnosis Date  ?? Allergy   ? allergic rhinitis  ?? Diabetes mellitus   ?? Elevated LFTs   ?? Hyperlipidemia   ?? Hypertension   ?? Obesity   ? ?No past surgical history on file. ?Current Outpatient Medications on File Prior to Visit  ?Medication Sig Dispense Refill  ?? aspirin 81 MG tablet Take 81 mg by mouth at bedtime.    ?? atorvastatin (LIPITOR) 20 MG tablet TAKE 1 TABLET BY MOUTH ONCE A DAY. 90 tablet 3  ?? Beta Carotene (VITAMIN A) 25000 UNIT capsule Take 25,000 Units by mouth daily.    ?? cetirizine (ZYRTEC) 10 MG tablet Take 1 tablet (10 mg total) by mouth daily. 30 tablet 0  ?? cholecalciferol (VITAMIN D) 1000 UNITS tablet Take 1,000 Units by mouth at bedtime.    ?? Cod Liver Oil 1000 MG CAPS Take 1 capsule by mouth daily.    ?? fish oil-omega-3 fatty acids 1000 MG capsule Take 1 g by mouth 2 (two) times daily. 1 in am, 2 in pm    ?? fluticasone (FLONASE) 50 MCG/ACT nasal spray Place 2 sprays into both nostrils daily. 16 g 0  ?? glucose blood test strip Check BS bid and prn  - Pt uses one touch ultra meter 100 each 12  ?? ibuprofen (ADVIL) 600 MG tablet Take 1 tablet (600 mg total) by mouth every 6 (six) hours as needed. 30 tablet 0  ?? JARDIANCE 25 MG TABS tablet TAKE 1 TABLET BY MOUTH ONCE A DAY. 90 tablet 3  ?? levocetirizine (XYZAL) 5 MG tablet Take 1 tablet (5 mg total) by mouth every evening. 30 tablet 0  ?? losartan (COZAAR) 50 MG tablet TAKE ONE TABLET BY MOUTH ONCE DAILY. 90 tablet 3  ?? metFORMIN (GLUCOPHAGE) 1000 MG tablet Take 1 tablet by mouth twice daily 180 tablet 3  ?? mometasone (ELOCON) 0.1 % cream Apply topically in the morning and at bedtime. 15 g 1  ?? Multiple Vitamins-Minerals (MULTIVITAMIN PO) Take 1 tablet by mouth daily.    ?? pioglitazone (ACTOS) 30 MG  tablet TAKE 1 TABLET EVERY DAY. 90 tablet 3  ?? pyridOXINE (VITAMIN B-6) 100 MG tablet Take 100 mg by mouth daily.    ?? vitamin C (ASCORBIC ACID) 500 MG tablet Take 500 mg by mouth at bedtime.    ? ?No current facility-administered medications on file prior to visit.  ? ? ?No Known Allergies ?Social History  ? ?Socioeconomic History  ?? Marital status: Married  ?  Spouse name: Not on file  ?? Number of children: Not on file  ?? Years of education: Not on file  ?? Highest education level: Not on file  ?Occupational History  ?? Not on file  ?Tobacco Use  ?? Smoking status: Never  ?? Smokeless tobacco: Current  ?  Types: Chew  ?Substance and Sexual Activity  ?? Alcohol use: No  ?? Drug use: No  ?  Comment: Used "cocaine" as young adult  ?? Sexual activity: Not on file  ?Other Topics Concern  ?? Not on file  ?Social History Narrative  ?? Not on file  ? ?Social Determinants of Health  ? ?Financial Resource Strain: Not on file  ?Food Insecurity: Not on file  ?Transportation  Needs: Not on file  ?Physical Activity: Not on file  ?Stress: Not on file  ?Social Connections: Not on file  ?Intimate Partner Violence: Not on file  ? ?Family History  ?Problem Relation Age of Onset  ?? Healthy Mother   ?? Hyperlipidemia Father   ?? Hypertension Father   ?? Heart disease Father   ?? Alcohol abuse Father   ? ? ? ? ?Review of Systems  ?All other systems reviewed and are negative. ? ?   ?Objective:  ? Physical Exam ?Vitals reviewed.  ?Constitutional:   ?   General: He is not in acute distress. ?   Appearance: He is well-developed. He is not diaphoretic.  ?HENT:  ?   Head: Normocephalic and atraumatic.  ?Neck:  ?   Thyroid: No thyromegaly.  ?   Vascular: No JVD.  ?   Trachea: No tracheal deviation.  ?Chest:  ? ? ?Skin: ?   Findings: Bruising present.  ?Neurological:  ?   Mental Status: He is alert.  ? ? ? ? ? ?   ?Assessment & Plan:  ?Bruise ?Lesion in the right axilla is a bruise.  There is no other bruising apparent anywhere else on  the body.  Is not tender and there is no evidence of a secondary infection.  I recommended tincture of time.  No other treatment is necessary.  If more bruising develops, I would recommend checking a CBC but I suspect this is simply due to a mild injury.  Patient will not be charged for today's visit ?

## 2021-08-02 LAB — COLOGUARD: COLOGUARD: NEGATIVE

## 2021-09-05 LAB — HM DIABETES EYE EXAM

## 2021-09-11 ENCOUNTER — Ambulatory Visit: Payer: 59 | Admitting: Family Medicine

## 2021-09-11 ENCOUNTER — Ambulatory Visit (HOSPITAL_COMMUNITY)
Admission: RE | Admit: 2021-09-11 | Discharge: 2021-09-11 | Disposition: A | Discharge status: Home / Self Care | Payer: 59 | Source: Ambulatory Visit | Attending: Family Medicine | Admitting: Family Medicine

## 2021-09-11 VITALS — BP 115/70 | HR 90 | Temp 97.3°F | Ht 66.0 in | Wt 192.0 lb

## 2021-09-11 DIAGNOSIS — M25531 Pain in right wrist: Secondary | ICD-10-CM

## 2021-09-11 MED ORDER — DICLOFENAC SODIUM 75 MG PO TBEC: 75.0000 mg | DELAYED_RELEASE_TABLET | Freq: Two times a day (BID) | ORAL | 2 refills | Status: DC

## 2021-09-11 NOTE — Progress Notes (Signed)
? ?Subjective:  ? ? Patient ID: Kyle Ray, male    DOB: 01/08/1961, 61 y.o.   MRN: [id number] ? ?Patient presents with pain and swelling in his right wrist.  Pain and swelling is over the distal ulna.  There is visible soft tissue swelling in that area.  He is tender to palpation.  He reports pain with dorsiflexion of the wrist.  He also reports pain with radial and ulnar deviation of the wrist.  He believes he is injured and therefore due to repetitive motion involved in a job.  He also reports pain in his left wrist with right wrist serious.  He denies any falls or injuries. ? ? ?Past Medical History:  ?Diagnosis Date  ? Allergy   ? allergic rhinitis  ? Diabetes mellitus   ? Elevated LFTs   ? Hyperlipidemia   ? Hypertension   ? Obesity   ? ?No past surgical history on file. ?Current Outpatient Medications on File Prior to Visit  ?Medication Sig Dispense Refill  ? aspirin 81 MG tablet Take 81 mg by mouth at bedtime.    ? atorvastatin (LIPITOR) 20 MG tablet TAKE 1 TABLET BY MOUTH ONCE A DAY. 90 tablet 3  ? Beta Carotene (VITAMIN A) 25000 UNIT capsule Take 25,000 Units by mouth daily.    ? cetirizine (ZYRTEC) 10 MG tablet Take 1 tablet (10 mg total) by mouth daily. 30 tablet 0  ? cholecalciferol (VITAMIN D) 1000 UNITS tablet Take 1,000 Units by mouth at bedtime.    ? Cod Liver Oil 1000 MG CAPS Take 1 capsule by mouth daily.    ? fish oil-omega-3 fatty acids 1000 MG capsule Take 1 g by mouth 2 (two) times daily. 1 in am, 2 in pm    ? fluticasone (FLONASE) 50 MCG/ACT nasal spray Place 2 sprays into both nostrils daily. 16 g 0  ? glucose blood test strip Check BS bid and prn  - Pt uses one touch ultra meter 100 each 12  ? ibuprofen (ADVIL) 600 MG tablet Take 1 tablet (600 mg total) by mouth every 6 (six) hours as needed. 30 tablet 0  ? JARDIANCE 25 MG TABS tablet TAKE 1 TABLET BY MOUTH ONCE A DAY. 90 tablet 3  ? levocetirizine (XYZAL) 5 MG tablet Take 1 tablet (5 mg total) by mouth every evening. 30 tablet 0  ?  losartan (COZAAR) 50 MG tablet TAKE ONE TABLET BY MOUTH ONCE DAILY. 90 tablet 3  ? metFORMIN (GLUCOPHAGE) 1000 MG tablet Take 1 tablet by mouth twice daily 180 tablet 3  ? mometasone (ELOCON) 0.1 % cream Apply topically in the morning and at bedtime. 15 g 1  ? Multiple Vitamins-Minerals (MULTIVITAMIN PO) Take 1 tablet by mouth daily.    ? pioglitazone (ACTOS) 30 MG tablet TAKE 1 TABLET EVERY DAY. 90 tablet 3  ? pyridOXINE (VITAMIN B-6) 100 MG tablet Take 100 mg by mouth daily.    ? TURMERIC PO Take by mouth.    ? vitamin C (ASCORBIC ACID) 500 MG tablet Take 500 mg by mouth at bedtime.    ? ?No current facility-administered medications on file prior to visit.  ? ? ?No Known Allergies ?Social History  ? ?Socioeconomic History  ? Marital status: Married  ?  Spouse name: Not on file  ? Number of children: Not on file  ? Years of education: Not on file  ? Highest education level: Not on file  ?Occupational History  ? Not on file  ?Tobacco  Use  ? Smoking status: Never  ? Smokeless tobacco: Current  ?  Types: Chew  ?Substance and Sexual Activity  ? Alcohol use: No  ? Drug use: No  ?  Comment: Used "cocaine" as young adult  ? Sexual activity: Not on file  ?Other Topics Concern  ? Not on file  ?Social History Narrative  ? Not on file  ? ?Social Determinants of Health  ? ?Financial Resource Strain: Not on file  ?Food Insecurity: Not on file  ?Transportation Needs: Not on file  ?Physical Activity: Not on file  ?Stress: Not on file  ?Social Connections: Not on file  ?Intimate Partner Violence: Not on file  ? ?Family History  ?Problem Relation Age of Onset  ? Healthy Mother   ? Hyperlipidemia Father   ? Hypertension Father   ? Heart disease Father   ? Alcohol abuse Father   ? ? ? ? ?Review of Systems  ?All other systems reviewed and are negative. ? ?   ?Objective:  ? Physical Exam ?Vitals reviewed.  ?Constitutional:   ?   General: He is not in acute distress. ?   Appearance: He is well-developed. He is not diaphoretic.  ?HENT:   ?   Head: Normocephalic and atraumatic.  ?Neck:  ?   Thyroid: No thyromegaly.  ?   Vascular: No JVD.  ?   Trachea: No tracheal deviation.  ?Cardiovascular:  ?   Rate and Rhythm: Normal rate and regular rhythm.  ?   Heart sounds: Normal heart sounds.  ?Pulmonary:  ?   Effort: Pulmonary effort is normal. No respiratory distress.  ?   Breath sounds: Normal breath sounds. No wheezing, rhonchi or rales.  ?Musculoskeletal:  ?   Right wrist: Swelling, tenderness and bony tenderness present. No snuff box tenderness or crepitus. Decreased range of motion. Normal pulse.  ?Neurological:  ?   Mental Status: He is alert.  ? ? ? ? ? ?   ?Assessment & Plan:  ?Wrist pain, acute, right - Plan: DG Wrist Complete Right ?I believe this is an injury due to repetitive motion.  The question if the patient has underlying osteoarthritis or if this is simply a sprain and the risk.  Recommended wearing a wrist splint coupled with diclofenac 75 mg p.o. twice daily for 1 week.  Await results of the x-ray. ?

## 2021-09-15 ENCOUNTER — Telehealth: Payer: Self-pay

## 2021-09-15 NOTE — Telephone Encounter (Signed)
Pt called in wanting to speak with clinical staff about his lab results. Please advise. ? ?Cb#: [phone] ?

## 2021-09-16 NOTE — Telephone Encounter (Signed)
Spoke pt re lab results for colon screening--negative and no signs of cancer. ? ?Pt voiced understanding. Nothing at this time. ? ? ?

## 2021-09-29 ENCOUNTER — Other Ambulatory Visit: Payer: Self-pay | Admitting: Family Medicine
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
## 2022-01-09 ENCOUNTER — Other Ambulatory Visit: Payer: 59

## 2022-01-09 DIAGNOSIS — E118 Type 2 diabetes mellitus with unspecified complications: Secondary | ICD-10-CM

## 2022-01-09 DIAGNOSIS — I10 Essential (primary) hypertension: Secondary | ICD-10-CM

## 2022-01-10 LAB — COMPREHENSIVE METABOLIC PANEL
AG Ratio: 2 (calc) (ref 1.0–2.5)
ALT: 17 U/L (ref 9–46)
AST: 22 U/L (ref 10–35)
Albumin: 4.6 g/dL (ref 3.6–5.1)
Alkaline phosphatase (APISO): 79 U/L (ref 35–144)
BUN: 9 mg/dL (ref 7–25)
CO2: 28 mmol/L (ref 20–32)
Calcium: 9.6 mg/dL (ref 8.6–10.3)
Chloride: 105 mmol/L (ref 98–110)
Creat: 0.92 mg/dL (ref 0.70–1.35)
Globulin: 2.3 g/dL (calc) (ref 1.9–3.7)
Glucose, Bld: 105 mg/dL — ABNORMAL HIGH (ref 65–99)
Potassium: 4.3 mmol/L (ref 3.5–5.3)
Sodium: 142 mmol/L (ref 135–146)
Total Bilirubin: 0.7 mg/dL (ref 0.2–1.2)
Total Protein: 6.9 g/dL (ref 6.1–8.1)

## 2022-01-10 LAB — HEMOGLOBIN A1C
Hgb A1c MFr Bld: 6.2 % of total Hgb — ABNORMAL HIGH (ref <5.7)
Mean Plasma Glucose: 131 mg/dL
eAG (mmol/L): 7.3 mmol/L

## 2022-01-30 ENCOUNTER — Encounter: Payer: Self-pay | Admitting: Family Medicine

## 2022-06-12 ENCOUNTER — Ambulatory Visit: Payer: 59 | Admitting: Family Medicine

## 2022-06-12 ENCOUNTER — Encounter: Payer: Self-pay | Admitting: Family Medicine

## 2022-06-12 VITALS — BP 132/82 | HR 89 | Temp 98.6°F | Ht 66.0 in | Wt 180.0 lb

## 2022-06-12 DIAGNOSIS — G8929 Other chronic pain: Secondary | ICD-10-CM

## 2022-06-12 DIAGNOSIS — M25531 Pain in right wrist: Secondary | ICD-10-CM

## 2022-06-12 NOTE — Progress Notes (Signed)
Subjective:    Patient ID: Kyle Ray, male    DOB: 18-Nov-1960, 62 y.o.   MRN: [id number] 09/11/21 Patient presents with pain and swelling in his right wrist.  Pain and swelling is over the distal ulna.  There is visible soft tissue swelling in that area.  He is tender to palpation.  He reports pain with dorsiflexion of the wrist.  He also reports pain with radial and ulnar deviation of the wrist.  He believes he is injured and therefore due to repetitive motion involved in a job.  He also reports pain in his left wrist with right wrist serious.  He denies any falls or injuries.  At that time, my plan was:  I believe this is an injury due to repetitive motion.  The question if the patient has underlying osteoarthritis or if this is simply a sprain and the risk.  Recommended wearing a wrist splint coupled with diclofenac 75 mg p.o. twice daily for 1 week.  Await results of the x-ray.  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  Patient wears a splint at work and took the NSAIDs with minimal improvement.  He is here today requesting a cortisone injection due to the persistent dorsal and lateral wrist pain primarily over the distal ulna.  He also reports swelling in that area with repetitive motion.  There is no erythema or warmth  Past Medical History:  Diagnosis Date   Allergy    allergic rhinitis   Diabetes mellitus    Elevated LFTs    Hyperlipidemia    Hypertension    Obesity    No past surgical history on file. Current Outpatient Medications on File Prior to Visit  Medication Sig Dispense Refill   aspirin 81 MG tablet Take 81 mg by mouth at bedtime.     atorvastatin (LIPITOR) 20 MG tablet TAKE 1 TABLET BY MOUTH ONCE A DAY. 90 tablet 2   Beta Carotene (VITAMIN A) 25000 UNIT capsule Take 25,000 Units by mouth daily.     cetirizine (ZYRTEC) 10 MG tablet Take 1 tablet (10 mg total) by mouth daily. 30 tablet 0   cholecalciferol (VITAMIN D) 1000 UNITS tablet Take  1,000 Units by mouth at bedtime.     Cod Liver Oil 1000 MG CAPS Take 1 capsule by mouth daily.     diclofenac (VOLTAREN) 75 MG EC tablet Take 1 tablet (75 mg total) by mouth 2 (two) times daily. 60 tablet 2   fish oil-omega-3 fatty acids 1000 MG capsule Take 1 g by mouth 2 (two) times daily. 1 in am, 2 in pm     fluticasone (FLONASE) 50 MCG/ACT nasal spray Place 2 sprays into both nostrils daily. 16 g 0   glucose blood test strip Check BS bid and prn  - Pt uses one touch ultra meter 100 each 12   ibuprofen (ADVIL) 600 MG tablet Take 1 tablet (600 mg total) by mouth every 6 (six) hours as needed. 30 tablet 0   JARDIANCE 25 MG TABS tablet TAKE 1 TABLET BY MOUTH ONCE A DAY. 90 tablet 3   levocetirizine (XYZAL) 5 MG tablet Take 1 tablet (5 mg total) by mouth every evening. 30 tablet 0   losartan (COZAAR) 50 MG tablet TAKE ONE TABLET BY MOUTH ONCE DAILY. 90 tablet 3   metFORMIN (GLUCOPHAGE) 1000 MG tablet Take 1 tablet by mouth twice daily 180 tablet 3   mometasone (ELOCON) 0.1 % cream Apply topically in the morning  and at bedtime. 15 g 1   Multiple Vitamins-Minerals (MULTIVITAMIN PO) Take 1 tablet by mouth daily.     pioglitazone (ACTOS) 30 MG tablet TAKE 1 TABLET EVERY DAY. 90 tablet 3   pyridOXINE (VITAMIN B-6) 100 MG tablet Take 100 mg by mouth daily.     TURMERIC PO Take by mouth.     vitamin C (ASCORBIC ACID) 500 MG tablet Take 500 mg by mouth at bedtime.     No current facility-administered medications on file prior to visit.    No Known Allergies Social History   Socioeconomic History   Marital status: Married    Spouse name: Not on file   Number of children: Not on file   Years of education: Not on file   Highest education level: Not on file  Occupational History   Not on file  Tobacco Use   Smoking status: Never   Smokeless tobacco: Current    Types: Chew  Substance and Sexual Activity   Alcohol use: No   Drug use: No    Comment: Used "cocaine" as young adult   Sexual  activity: Not on file  Other Topics Concern   Not on file  Social History Narrative   Not on file   Social Determinants of Health   Financial Resource Strain: Not on file  Food Insecurity: Not on file  Transportation Needs: Not on file  Physical Activity: Not on file  Stress: Not on file  Social Connections: Not on file  Intimate Partner Violence: Not on file   Family History  Problem Relation Age of Onset   Healthy Mother    Hyperlipidemia Father    Hypertension Father    Heart disease Father    Alcohol abuse Father       Review of Systems  All other systems reviewed and are negative.      Objective:   Physical Exam Vitals reviewed.  Constitutional:      General: He is not in acute distress.    Appearance: He is well-developed. He is not diaphoretic.  HENT:     Head: Normocephalic and atraumatic.  Neck:     Thyroid: No thyromegaly.     Vascular: No JVD.     Trachea: No tracheal deviation.  Cardiovascular:     Rate and Rhythm: Normal rate and regular rhythm.     Heart sounds: Normal heart sounds.  Pulmonary:     Effort: Pulmonary effort is normal. No respiratory distress.     Breath sounds: Normal breath sounds. No wheezing, rhonchi or rales.  Musculoskeletal:     Right wrist: Swelling, tenderness and bony tenderness present. No snuff box tenderness or crepitus. Decreased range of motion. Normal pulse.  Neurological:     Mental Status: He is alert.           Assessment & Plan:  Chronic pain of right wrist Patient's right hand was placed in approximately 20 degrees of flexion.  The anatomic snuffbox was located distal to the radius.  This space was injected with 1/2 cc of 40 mg/mL Kenalog and 1/2 cc of 0.1% lidocaine using sterile technique.  Patient tolerated procedure well without complication.  If pain does not improve, would recommend referral to orthopedic surgery

## 2022-07-04 ENCOUNTER — Encounter (HOSPITAL_BASED_OUTPATIENT_CLINIC_OR_DEPARTMENT_OTHER): Payer: Self-pay | Admitting: Emergency Medicine

## 2022-07-04 ENCOUNTER — Emergency Department (HOSPITAL_BASED_OUTPATIENT_CLINIC_OR_DEPARTMENT_OTHER): Payer: 59

## 2022-07-04 ENCOUNTER — Emergency Department (HOSPITAL_BASED_OUTPATIENT_CLINIC_OR_DEPARTMENT_OTHER)
Admission: EM | Admit: 2022-07-04 | Discharge: 2022-07-04 | Disposition: A | Discharge status: Home / Self Care | Payer: 59 | Attending: Emergency Medicine | Admitting: Emergency Medicine

## 2022-07-04 DIAGNOSIS — I10 Essential (primary) hypertension: Secondary | ICD-10-CM | POA: Insufficient documentation

## 2022-07-04 DIAGNOSIS — E119 Type 2 diabetes mellitus without complications: Secondary | ICD-10-CM | POA: Insufficient documentation

## 2022-07-04 DIAGNOSIS — R1032 Left lower quadrant pain: Secondary | ICD-10-CM | POA: Diagnosis present

## 2022-07-04 DIAGNOSIS — K529 Noninfective gastroenteritis and colitis, unspecified: Secondary | ICD-10-CM | POA: Diagnosis not present

## 2022-07-04 LAB — LIPASE, BLOOD: Lipase: 147 U/L — ABNORMAL HIGH (ref 11–51)

## 2022-07-04 LAB — COMPREHENSIVE METABOLIC PANEL
ALT: 21 U/L (ref 0–44)
AST: 25 U/L (ref 15–41)
Albumin: 4.5 g/dL (ref 3.5–5.0)
Alkaline Phosphatase: 77 U/L (ref 38–126)
Anion gap: 8 (ref 5–15)
BUN: 21 mg/dL (ref 8–23)
CO2: 28 mmol/L (ref 22–32)
Calcium: 9.2 mg/dL (ref 8.9–10.3)
Chloride: 102 mmol/L (ref 98–111)
Creatinine, Ser: 0.94 mg/dL (ref 0.61–1.24)
GFR, Estimated: >60 mL/min (ref >60)
Glucose, Bld: 132 mg/dL — ABNORMAL HIGH (ref 70–99)
Potassium: 3.8 mmol/L (ref 3.5–5.1)
Sodium: 138 mmol/L (ref 135–145)
Total Bilirubin: 1.4 mg/dL — ABNORMAL HIGH (ref 0.3–1.2)
Total Protein: 7.7 g/dL (ref 6.5–8.1)

## 2022-07-04 LAB — CBC WITH DIFFERENTIAL/PLATELET
Abs Immature Granulocytes: 0.02 10*3/uL (ref 0.00–0.07)
Basophils Absolute: 0 10*3/uL (ref 0.0–0.1)
Basophils Relative: 0 %
Eosinophils Absolute: 0.1 10*3/uL (ref 0.0–0.5)
Eosinophils Relative: 1 %
HCT: 45.5 % (ref 39.0–52.0)
Hemoglobin: 15.1 g/dL (ref 13.0–17.0)
Immature Granulocytes: 0 %
Lymphocytes Relative: 6 %
Lymphs Abs: 0.6 10*3/uL — ABNORMAL LOW (ref 0.7–4.0)
MCH: 32 pg (ref 26.0–34.0)
MCHC: 33.2 g/dL (ref 30.0–36.0)
MCV: 96.4 fL (ref 80.0–100.0)
Monocytes Absolute: 0.3 10*3/uL (ref 0.1–1.0)
Monocytes Relative: 3 %
Neutro Abs: 8.9 10*3/uL — ABNORMAL HIGH (ref 1.7–7.7)
Neutrophils Relative %: 90 %
Platelets: 140 10*3/uL — ABNORMAL LOW (ref 150–400)
RBC: 4.72 MIL/uL (ref 4.22–5.81)
RDW: 14 % (ref 11.5–15.5)
WBC: 9.9 10*3/uL (ref 4.0–10.5)
nRBC: 0 % (ref 0.0–0.2)

## 2022-07-04 MED ORDER — SODIUM CHLORIDE 0.9 % IV BOLUS
1000.0000 mL | Freq: Once | INTRAVENOUS | Status: AC
  Administered 2022-07-04: 1000 mL via INTRAVENOUS

## 2022-07-04 MED ORDER — ONDANSETRON HCL 4 MG/2ML IJ SOLN
4.0000 mg | Freq: Once | INTRAMUSCULAR | Status: AC
  Administered 2022-07-04: 4 mg via INTRAVENOUS
  Filled 2022-07-04: qty 2

## 2022-07-04 MED ORDER — ONDANSETRON 4 MG PO TBDP: ORAL_TABLET | ORAL | 0 refills | Status: AC

## 2022-07-04 MED ORDER — IOHEXOL 300 MG/ML  SOLN
100.0000 mL | Freq: Once | INTRAMUSCULAR | Status: AC | PRN
  Administered 2022-07-04: 100 mL via INTRAVENOUS

## 2022-07-04 NOTE — ED Notes (Signed)
Patient transported to CT 

## 2022-07-04 NOTE — ED Triage Notes (Signed)
Pt vomited several times at work today, now has LLQ pain; UC sent here for further eval

## 2022-07-04 NOTE — ED Notes (Signed)
Discharge paperwork reviewed entirely with patient, including Rx's and follow up care. Pain was under control. Pt verbalized understanding as well as all parties involved. No questions or concerns voiced at the time of discharge. No acute distress noted.   Pt ambulated out to PVA without incident or assistance.  

## 2022-07-04 NOTE — ED Provider Notes (Signed)
Emergency Department Provider Note   I have reviewed the triage vital signs and the nursing notes.   HISTORY  Chief Complaint Emesis   HPI Kyle Ray is a 62 y.o. male past history of diabetes, hypertension, hyperlipidemia presents emergency department for evaluation of abdominal pain with vomiting, now focused in the left lower quadrant.  Patient was referred here from urgent care.  He states he was at work and began to feel very sleepy and fatigued.  After break, he had an episode of emesis which was nonbloody.  He had some cramping pain in his abdomen diffusely which he thought was muscle straining from vomiting and then pain seemed to become more focal in the left lower abdomen.  He went to urgent care for evaluation and after examination they referred him to the ED for further eval.  No pain to the chest, palpitations, shortness of breath.  No fevers or chills.  No other sick contacts.  No respiratory symptoms.    Past Medical History:  Diagnosis Date   Allergy    allergic rhinitis   Diabetes mellitus    Elevated LFTs    Hyperlipidemia    Hypertension    Obesity     Review of Systems  Constitutional: No fever/chills Cardiovascular: Denies chest pain. Respiratory: Denies shortness of breath. Gastrointestinal: Positive LLQ abdominal pain. Positive nausea and vomiting.  No diarrhea.  No constipation. Musculoskeletal: Negative for back pain. Skin: Negative for rash. Neurological: Negative for headaches.   ____________________________________________   PHYSICAL EXAM:  VITAL SIGNS: ED Triage Vitals  Enc Vitals Group     BP 07/04/22 1409 117/68     Pulse Rate 07/04/22 1409 (!) 114     Resp 07/04/22 1409 18     Temp 07/04/22 1409 98.4 F (36.9 C)     Temp Source 07/04/22 1409 Oral     SpO2 07/04/22 1409 100 %     Weight 07/04/22 1409 185 lb (83.9 kg)     Height 07/04/22 1409 5' 7"$  (1.702 m)   Constitutional: Alert and oriented. Well appearing and in no  acute distress. Eyes: Conjunctivae are normal. Head: Atraumatic. Nose: No congestion/rhinnorhea. Mouth/Throat: Mucous membranes are moist.  Neck: No stridor.   Cardiovascular: Normal rate, regular rhythm. Good peripheral circulation. Grossly normal heart sounds.   Respiratory: Normal respiratory effort.  No retractions. Lungs CTAB. Gastrointestinal: Soft with focal LLQ tenderness. No rebound or guarding. Remaining abdomen is non-tender. No distention.  Musculoskeletal: No lower extremity tenderness nor edema. No gross deformities of extremities. Neurologic:  Normal speech and language. No gross focal neurologic deficits are appreciated.  Skin:  Skin is warm, dry and intact. No rash noted.  ____________________________________________   LABS (all labs ordered are listed, but only abnormal results are displayed)  Labs Reviewed  COMPREHENSIVE METABOLIC PANEL - Abnormal; Notable for the following components:      Result Value   Glucose, Bld 132 (*)    Total Bilirubin 1.4 (*)    All other components within normal limits  LIPASE, BLOOD - Abnormal; Notable for the following components:   Lipase 147 (*)    All other components within normal limits  CBC WITH DIFFERENTIAL/PLATELET - Abnormal; Notable for the following components:   Platelets 140 (*)    Neutro Abs 8.9 (*)    Lymphs Abs 0.6 (*)    All other components within normal limits    ____________________________________________   PROCEDURES  Procedure(s) performed:   Procedures  None  ____________________________________________  INITIAL IMPRESSION / ASSESSMENT AND PLAN / ED COURSE  Pertinent labs & imaging results that were available during my care of the patient were reviewed by me and considered in my medical decision making (see chart for details).   This patient is Presenting for Evaluation of abdominal pain, which does require a range of treatment options, and is a complaint that involves a high risk of morbidity  and mortality.  The Differential Diagnoses includes but is not exclusive to acute cholecystitis, intrathoracic causes for epigastric abdominal pain, gastritis, duodenitis, pancreatitis, small bowel or large bowel obstruction, diverticulitis, abdominal aortic aneurysm, hernia, gastritis, etc.   Critical Interventions-    Medications  sodium chloride 0.9 % bolus 1,000 mL ( Intravenous Stopped 07/04/22 1604)  iohexol (OMNIPAQUE) 300 MG/ML solution 100 mL (100 mLs Intravenous Contrast Given 07/04/22 1510)  ondansetron (ZOFRAN) injection 4 mg (4 mg Intravenous Given 07/04/22 1505)    Reassessment after intervention:  Pain improved.   I decided to review pertinent External Data, and in summary urgent care notes from today show COVID and flu A/B negative.    Clinical Laboratory Tests Ordered, included mild lipase elevation at 147. Normal creatinine. No leukocytosis.   Radiologic Tests Ordered, included CT abdomen/pelvis. I independently interpreted the images and agree with radiology interpretation.   Cardiac Monitor Tracing which shows tachycardia.   Social Determinants of Health Risk patient is a smoker.   Medical Decision Making: Summary:  Patient presents emergency department for evaluation of left lower quadrant abdominal pain with vomiting.  Initially presented as fatigue but no chest pain or other ACS equivalent to strongly suspect atypical ACS presentation.  He has some tenderness in the left lower quadrant and arrives with mild tachycardia but no distress.  In this setting, plan for IV fluids, labs, CT abdomen pelvis for further evaluation.  Reevaluation with update and discussion with patient. CT pending. Care transferred to Dr. Darl Householder.   Considered admission but workup is pending.   Patient's presentation is most consistent with acute presentation with potential threat to life or bodily function.   Disposition: pending  ____________________________________________  FINAL CLINICAL  IMPRESSION(S) / ED DIAGNOSES  Final diagnoses:  Gastroenteritis     NEW OUTPATIENT MEDICATIONS STARTED DURING THIS VISIT:  Discharge Medication List as of 07/04/2022  3:56 PM     START taking these medications   Details  ondansetron (ZOFRAN-ODT) 4 MG disintegrating tablet 3m ODT q4 hours prn nausea/vomit, Print        Note:  This document was prepared using Dragon voice recognition software and may include unintentional dictation errors.  JNanda Quinton MD, [facility]    Foch Rosenwald, JWonda Olds MD 07/05/22 [phone]

## 2022-07-04 NOTE — ED Provider Notes (Signed)
  Physical Exam  BP 117/68 (BP Location: Right Arm)   Pulse (!) 114   Temp 98.4 F (36.9 C) (Oral)   Resp 18   Ht 5\' 7"  (1.702 m)   Wt 83.9 kg   SpO2 100%   BMI 28.98 kg/m   Physical Exam  Procedures  Procedures  ED Course / MDM    Medical Decision Making Care assumed at 3 PM.  Patient has gastroenteritis symptoms and elevated lipase of 147.  Signed out pending CT abdomen pelvis to rule out diverticulitis versus gastroenteritis.  3:54 PM CT showed diffuse dilated loops of bowel consistent with diarrheal illness.  Patient is feeling better after IV fluids.  I think likely gastroenteritis.  Told him to take Imodium as needed and prescribe Zofran as needed.   Problems Addressed: Gastroenteritis: acute illness or injury  Amount and/or Complexity of Data Reviewed Labs: ordered. Decision-making details documented in ED Course. Radiology: ordered and independent interpretation performed. Decision-making details documented in ED Course.  Risk Prescription drug management.         Drenda Freeze, MD 07/04/22 [phone]

## 2022-07-04 NOTE — Discharge Instructions (Signed)
You likely have a stomach virus  Stay hydrated and take Zofran as needed  Take Imodium as needed for diarrhea  See your doctor for follow-up  Return to ER if you have worse abdominal pain or vomiting or dehydration

## 2022-07-06 ENCOUNTER — Telehealth: Payer: Self-pay

## 2022-07-06 ENCOUNTER — Other Ambulatory Visit: Payer: 59

## 2022-07-06 DIAGNOSIS — E118 Type 2 diabetes mellitus with unspecified complications: Secondary | ICD-10-CM

## 2022-07-06 NOTE — Transitions of Care (Post Inpatient/ED Visit) (Signed)
   07/06/2022  Name: Kyle Ray MRN: [id number] DOB: August 13, 1960  Today's TOC FU Call Status: Today's TOC FU Call Status:: Successful TOC FU Call Competed TOC FU Call Complete Date: 07/06/22  Transition Care Management Follow-up Telephone Call Date of Discharge: 07/04/22 Discharge Facility: [facility] Type of Discharge: Emergency Department Reason for ED Visit: Other: (gastroenteritis) How have you been since you were released from the hospital?: Better Any questions or concerns?: Yes Patient Questions/Concerns:: pt states the cortisone shot didnt help wrist and needs referral to ortho  Items Reviewed: Did you receive and understand the discharge instructions provided?: Yes Medications obtained and verified?: Yes (Medications Reviewed) Any new allergies since your discharge?: No Dietary orders reviewed?: NA Do you have support at home?: Yes  Home Care and Equipment/Supplies: [place] Ordered?: NA Any new equipment or medical supplies ordered?: NA  Functional Questionnaire: Do you need assistance with bathing/showering or dressing?: No Do you need assistance with meal preparation?: No Do you need assistance with eating?: No Do you have difficulty maintaining continence: No Do you need assistance with getting out of bed/getting out of a chair/moving?: No Do you have difficulty managing or taking your medications?: No  Folllow up appointments reviewed: PCP Follow-up appointment confirmed?: No MD Provider Line Number:[phone] Given: Yes [facility] Follow-up appointment confirmed?: NA Do you need transportation to your follow-up appointment?: No Do you understand care options if your condition(s) worsen?: Yes-patient verbalized understanding    [place] LPN [place] Direct Dial [phone]

## 2022-07-07 LAB — HEMOGLOBIN A1C
Hgb A1c MFr Bld: 6.6 % of total Hgb — ABNORMAL HIGH (ref <5.7)
Mean Plasma Glucose: 143 mg/dL
eAG (mmol/L): 7.9 mmol/L

## 2022-07-10 ENCOUNTER — Other Ambulatory Visit: Payer: Self-pay | Admitting: Family Medicine

## 2022-07-11 ENCOUNTER — Other Ambulatory Visit: Payer: Self-pay | Admitting: Family Medicine

## 2022-07-13 NOTE — Telephone Encounter (Signed)
Requested Prescriptions  Pending Prescriptions Disp Refills   metFORMIN (GLUCOPHAGE) 1000 MG tablet [Pharmacy Med Name: metFORMIN HCl 1000 MG Oral Tablet] 180 tablet 0    Sig: Take 1 tablet by mouth twice daily     Endocrinology:  Diabetes - Biguanides Failed - 07/11/2022  5:08 PM      Failed - B12 Level in normal range and within 720 days    No results found for: "VITAMINB12"       Failed - Valid encounter within last 6 months    Recent Outpatient Visits           10 months ago Wrist pain, acute, right   [place] Pickard, Cammie Mcgee, MD   11 months ago [place] Pickard, Cammie Mcgee, MD   1 year ago Colon cancer screening   [place] Susy Frizzle, MD   1 year ago Tick bite of pelvic region, initial encounter   [place] Susy Frizzle, MD   1 year ago [place] Susy Frizzle, MD              Failed - CBC within normal limits and completed in the last 12 months    WBC  Date Value Ref Range Status  07/04/2022 9.9 4.0 - 10.5 K/uL Final   RBC  Date Value Ref Range Status  07/04/2022 4.72 4.22 - 5.81 MIL/uL Final   Hemoglobin  Date Value Ref Range Status  07/04/2022 15.1 13.0 - 17.0 g/dL Final   HCT  Date Value Ref Range Status  07/04/2022 45.5 39.0 - 52.0 % Final   MCHC  Date Value Ref Range Status  07/04/2022 33.2 30.0 - 36.0 g/dL Final   [facility]  Date Value Ref Range Status  07/04/2022 32.0 26.0 - 34.0 pg Final   MCV  Date Value Ref Range Status  07/04/2022 96.4 80.0 - 100.0 fL Final   No results found for: "PLTCOUNTKUC", "LABPLAT", "POCPLA" RDW  Date Value Ref Range Status  07/04/2022 14.0 11.5 - 15.5 % Final         Passed - Cr in normal range and within 360 days    Creat  Date Value Ref Range Status  01/09/2022 0.92 0.70 - 1.35 mg/dL Final   Creatinine, Ser  Date Value Ref Range Status  07/04/2022 0.94 0.61 - 1.24 mg/dL  Final   Creatinine, Urine  Date Value Ref Range Status  07/08/2016 85 20 - 370 mg/dL Final         Passed - HBA1C is between 0 and 7.9 and within 180 days    Hgb A1c MFr Bld  Date Value Ref Range Status  07/06/2022 6.6 (H) <5.7 % of total Hgb Final    Comment:    For someone without known diabetes, a hemoglobin A1c value of 6.5% or greater indicates that they may have  diabetes and this should be confirmed with a follow-up  test. . For someone with known diabetes, a value <7% indicates  that their diabetes is well controlled and a value  greater than or equal to 7% indicates suboptimal  control. A1c targets should be individualized based on  duration of diabetes, age, comorbid conditions, and  other considerations. . Currently, no consensus exists regarding use of hemoglobin A1c for diagnosis of diabetes for children. .          Passed - eGFR in normal range and  within 360 days    GFR, Est African American  Date Value Ref Range Status  06/28/2020 113 > OR = 60 mL/min/1.45m Final   GFR, Est Non African American  Date Value Ref Range Status  06/28/2020 97 > OR = 60 mL/min/1.759mFinal   GFR, Estimated  Date Value Ref Range Status  07/04/2022 >60 >60 mL/min Final    Comment:    (NOTE) Calculated using the CKD-EPI Creatinine Equation (2021)    eGFR  Date Value Ref Range Status  06/27/2021 98 > OR = 60 mL/min/1.7386minal    Comment:    The eGFR is based on the CKD-EPI 2021 equation. To calculate  the new eGFR from a previous Creatinine or Cystatin C result, go to https://www.kidney.org/professionals/ kdoqi/gfr%5Fcalculator

## 2022-08-12 ENCOUNTER — Other Ambulatory Visit: Payer: Self-pay | Admitting: Family Medicine

## 2022-09-14 ENCOUNTER — Other Ambulatory Visit: Payer: Self-pay | Admitting: Family Medicine

## 2022-09-14 ENCOUNTER — Other Ambulatory Visit: Payer: Self-pay

## 2022-09-14 NOTE — Telephone Encounter (Signed)
Prescription Request  09/14/2022  LOV: 06/12/22  What is the name of the medication or equipment? JARDIANCE 25 MG TABS tablet [[id number]]   Have you contacted your pharmacy to request a refill? Yes   Which pharmacy would you like this sent to?   [facility] - [place], [place] - [address], [place] [address] Phone: [phone]  Fax: [phone]    Patient notified that their request is being sent to the clinical staff for review and that they should receive a response within 2 business days.   Prescription Request  09/14/2022  LOV:06/12/22  What is the name of the medication or equipment? losartan (COZAAR) 50 MG tablet [[id number]]  Have you contacted your pharmacy to request a refill? Yes   Which pharmacy would you like this sent to? [facility] - [place], [place] - [address], [place] [address] Phone: [phone]  Fax: [phone]    Patient notified that their request is being sent to the clinical staff for review and that they should receive a response within 2 business days.   Please advise at Mobile [phone] (mobile)   Please advise at [facility] [phone]   Prescription Request  09/14/2022  LOV:06/12/22  What is the name of the medication or equipment? pioglitazone (ACTOS) 30 MG tablet [[id number]]  DISCONTINUED  Have you contacted your pharmacy to request a refill? Yes   Which pharmacy would you like this sent to? [facility] - [place], [place] - [address], [place] [address] Phone: [phone]  Fax: [phone] DEA #: --    Patient notified that their request is being sent to the clinical staff for review and that they should receive a response within 2 business days.   Please advise at [facility] [phone]  Prescription Request  09/14/2022  LOV: 06/12/22  What is the name of the medication or equipment? atorvastatin (LIPITOR) 20 MG tablet [[id number]]   DISCONTINUED   Have you contacted your pharmacy to request a refill? Yes   Which pharmacy would you like this sent to?   [facility] - [place], [place] - [address],  [place] [address] Phone: [phone]  Fax: [phone]    Patient notified that their request is being sent to the clinical staff for review and that they should receive a response within 2 business days.   Please advise at [facility] [phone]

## 2022-09-15 NOTE — Telephone Encounter (Signed)
Requested Prescriptions  Pending Prescriptions Disp Refills   atorvastatin (LIPITOR) 20 MG tablet 30 tablet 0    Sig: Take 1 tablet (20 mg total) by mouth daily.     Cardiovascular:  Antilipid - Statins Failed - 09/14/2022  4:33 PM      Failed - Valid encounter within last 12 months    Recent Outpatient Visits           1 year ago Wrist pain, acute, right   [facility] Pickard, Priscille Heidelberg, MD   1 year ago Bruise   [facility] Pickard, Priscille Heidelberg, MD   1 year ago Colon cancer screening   [facility] Tanya Nones Priscille Heidelberg, MD   1 year ago Tick bite of pelvic region, initial encounter   [facility] Pickard, Priscille Heidelberg, MD   2 years ago Rhinosinusitis   [facility] Donita Brooks, MD              Failed - Lipid Panel in normal range within the last 12 months    Cholesterol  Date Value Ref Range Status  06/27/2021 106 <200 mg/dL Final   LDL Cholesterol (Calc)  Date Value Ref Range Status  06/27/2021 36 mg/dL (calc) Final    Comment:    Reference range: <100 . Desirable range <100 mg/dL for primary prevention;   <70 mg/dL for patients with CHD or diabetic patients  with > or = 2 CHD risk factors. Marland Kitchen LDL-C is now calculated using the Martin-Hopkins  calculation, which is a validated novel method providing  better accuracy than the Friedewald equation in the  estimation of LDL-C.  Horald Pollen et al. Lenox Ahr. 4098;119(14): 2061-2068  (http://education.[employer].com/faq/FAQ164)    HDL  Date Value Ref Range Status  06/27/2021 57 > OR = 40 mg/dL Final   Triglycerides  Date Value Ref Range Status  06/27/2021 46 <150 mg/dL Final         Passed - Patient is not pregnant       losartan (COZAAR) 50 MG tablet 30 tablet 0    Sig: Take 1 tablet (50 mg total) by mouth daily.     Cardiovascular:  Angiotensin Receptor Blockers Failed - 09/14/2022  4:33 PM      Failed - Valid encounter within last  6 months    Recent Outpatient Visits           1 year ago Wrist pain, acute, right   [facility] Pickard, Priscille Heidelberg, MD   1 year ago Bruise   [facility] Pickard, Priscille Heidelberg, MD   1 year ago Colon cancer screening   [facility] Tanya Nones Priscille Heidelberg, MD   1 year ago Tick bite of pelvic region, initial encounter   [facility] Pickard, Priscille Heidelberg, MD   2 years ago Rhinosinusitis   [facility] Donita Brooks, MD              Passed - Cr in normal range and within 180 days    Creat  Date Value Ref Range Status  01/09/2022 0.92 0.70 - 1.35 mg/dL Final   Creatinine, Ser  Date Value Ref Range Status  07/04/2022 0.94 0.61 - 1.24 mg/dL Final   Creatinine, Urine  Date Value Ref Range Status  07/08/2016 85 20 - 370 mg/dL Final         Passed - K in normal range and  within 180 days    Potassium  Date Value Ref Range Status  07/04/2022 3.8 3.5 - 5.1 mmol/L Final         Passed - Patient is not pregnant      Passed - Last BP in normal range    BP Readings from Last 1 Encounters:  07/04/22 136/70          pioglitazone (ACTOS) 30 MG tablet 30 tablet 0    Sig: Take 1 tablet (30 mg total) by mouth daily.     Endocrinology:  Diabetes - Glitazones - pioglitazone Failed - 09/14/2022  4:33 PM      Failed - Valid encounter within last 6 months    Recent Outpatient Visits           1 year ago Wrist pain, acute, right   [facility] Pickard, Priscille Heidelberg, MD   1 year ago Bruise   [facility] Pickard, Priscille Heidelberg, MD   1 year ago Colon cancer screening   [facility] Donita Brooks, MD   1 year ago Tick bite of pelvic region, initial encounter   [facility] Donita Brooks, MD   2 years ago Rhinosinusitis   [facility] Pickard, Priscille Heidelberg, MD              Passed - HBA1C is between 0 and 7.9 and within 180 days     Hgb A1c MFr Bld  Date Value Ref Range Status  07/06/2022 6.6 (H) <5.7 % of total Hgb Final    Comment:    For someone without known diabetes, a hemoglobin A1c value of 6.5% or greater indicates that they may have  diabetes and this should be confirmed with a follow-up  test. . For someone with known diabetes, a value <7% indicates  that their diabetes is well controlled and a value  greater than or equal to 7% indicates suboptimal  control. A1c targets should be individualized based on  duration of diabetes, age, comorbid conditions, and  other considerations. . Currently, no consensus exists regarding use of hemoglobin A1c for diagnosis of diabetes for children. .           empagliflozin (JARDIANCE) 25 MG TABS tablet 30 tablet 0    Sig: Take 1 tablet (25 mg total) by mouth daily.     Endocrinology:  Diabetes - SGLT2 Inhibitors Failed - 09/14/2022  4:33 PM      Failed - Valid encounter within last 6 months    Recent Outpatient Visits           1 year ago Wrist pain, acute, right   [facility] Pickard, Priscille Heidelberg, MD   1 year ago Bruise   [facility] Pickard, Priscille Heidelberg, MD   1 year ago Colon cancer screening   [facility] Tanya Nones Priscille Heidelberg, MD   1 year ago Tick bite of pelvic region, initial encounter   [facility] Pickard, Priscille Heidelberg, MD   2 years ago Rhinosinusitis   [facility] Tanya Nones, Priscille Heidelberg, MD              Passed - Cr in normal range and within 360 days    Creat  Date Value Ref Range Status  01/09/2022 0.92 0.70 - 1.35 mg/dL Final   Creatinine, Ser  Date Value Ref Range Status  07/04/2022 0.94 0.61 - 1.24 mg/dL  Final   Creatinine, Urine  Date Value Ref Range Status  07/08/2016 85 20 - 370 mg/dL Final         Passed - HBA1C is between 0 and 7.9 and within 180 days    Hgb A1c MFr Bld  Date Value Ref Range Status  07/06/2022 6.6 (H) <5.7 % of total Hgb Final     Comment:    For someone without known diabetes, a hemoglobin A1c value of 6.5% or greater indicates that they may have  diabetes and this should be confirmed with a follow-up  test. . For someone with known diabetes, a value <7% indicates  that their diabetes is well controlled and a value  greater than or equal to 7% indicates suboptimal  control. A1c targets should be individualized based on  duration of diabetes, age, comorbid conditions, and  other considerations. . Currently, no consensus exists regarding use of hemoglobin A1c for diagnosis of diabetes for children. .          Passed - eGFR in normal range and within 360 days    GFR, Est African American  Date Value Ref Range Status  06/28/2020 113 > OR = 60 mL/min/1.73m2 Final   GFR, Est Non African American  Date Value Ref Range Status  06/28/2020 97 > OR = 60 mL/min/1.38m2 Final   GFR, Estimated  Date Value Ref Range Status  07/04/2022 >60 >60 mL/min Final    Comment:    (NOTE) Calculated using the CKD-EPI Creatinine Equation (2021)    eGFR  Date Value Ref Range Status  06/27/2021 98 > OR = 60 mL/min/1.74m2 Final    Comment:    The eGFR is based on the CKD-EPI 2021 equation. To calculate  the new eGFR from a previous Creatinine or Cystatin C result, go to https://www.kidney.org/professionals/ kdoqi/gfr%5Fcalculator

## 2022-10-02 IMAGING — DX DG WRIST COMPLETE 3+V*R*
4 series · 4 of 4 positions shown · non-contrast
Comparison: None.

CLINICAL DATA: Pain over distal ulna.  No known injury.

EXAM:
RIGHT WRIST - COMPLETE 3+ VIEW

[wrist pa]
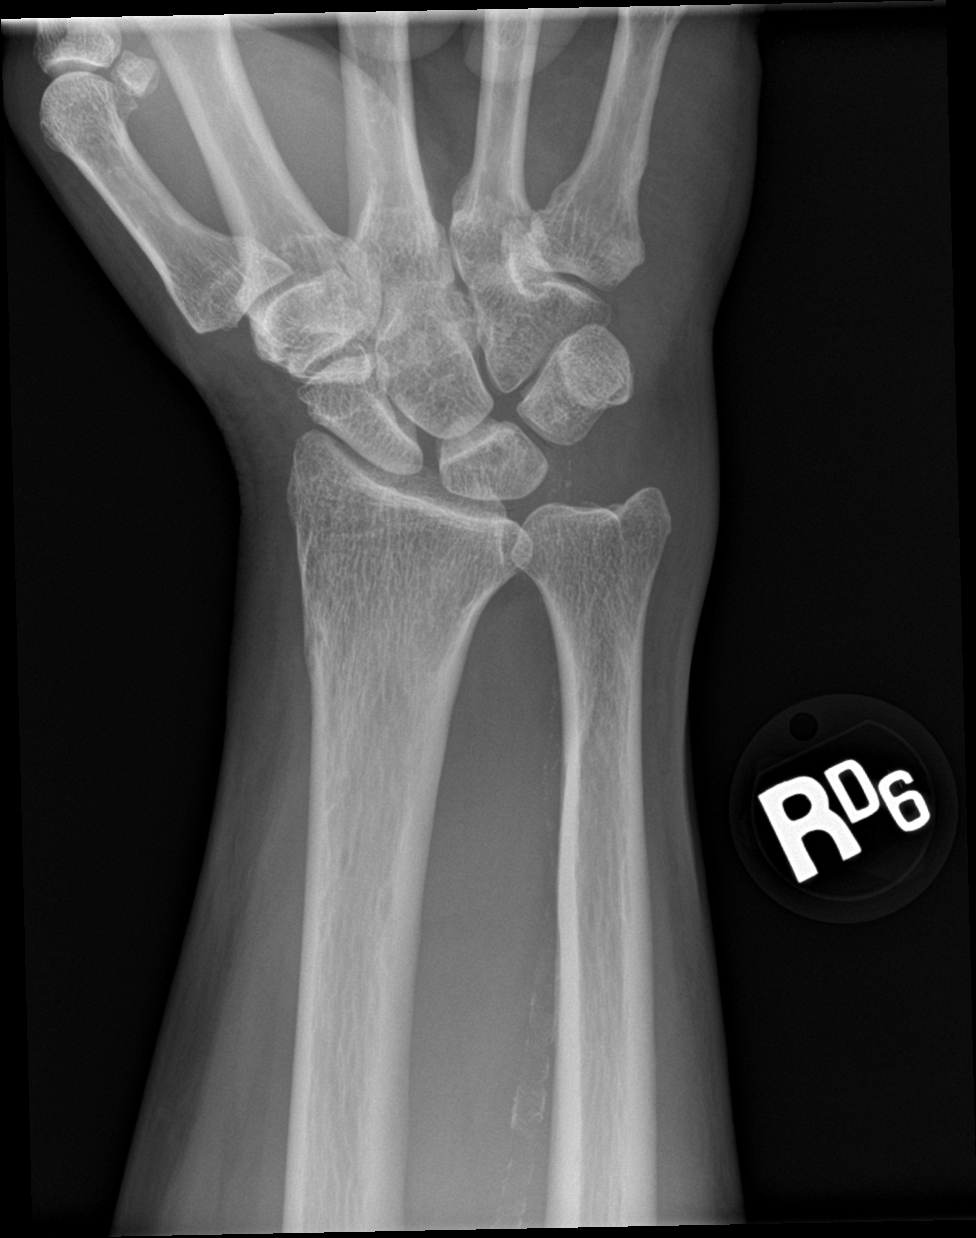

[wrist obl]
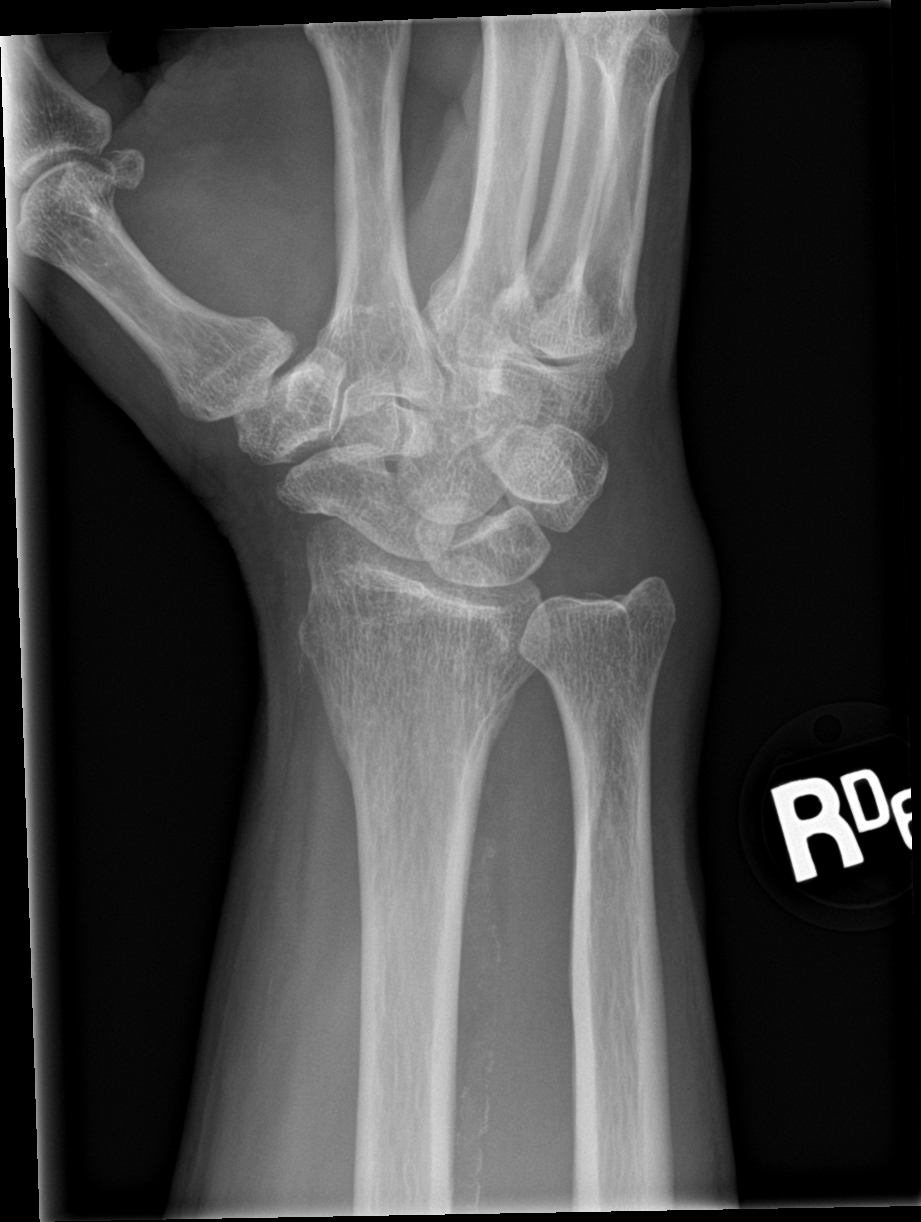

[wrist lat]
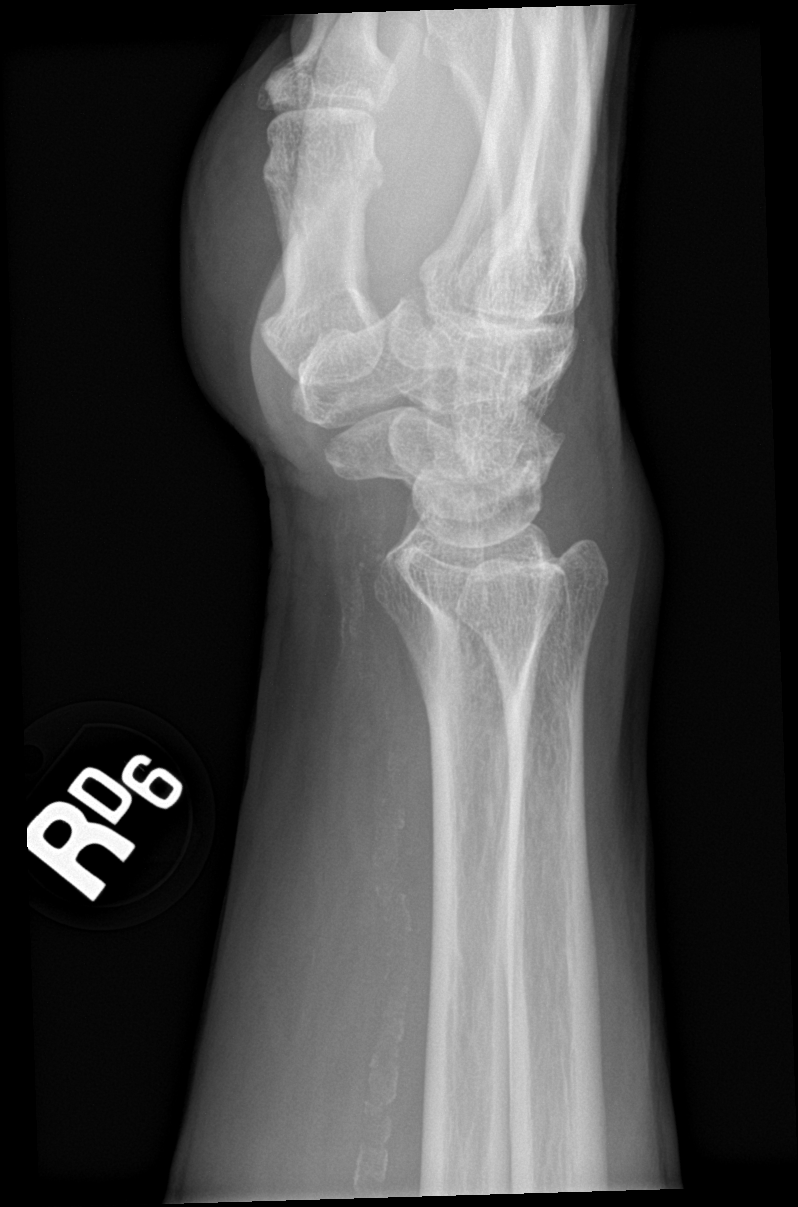

[wrist navicular]
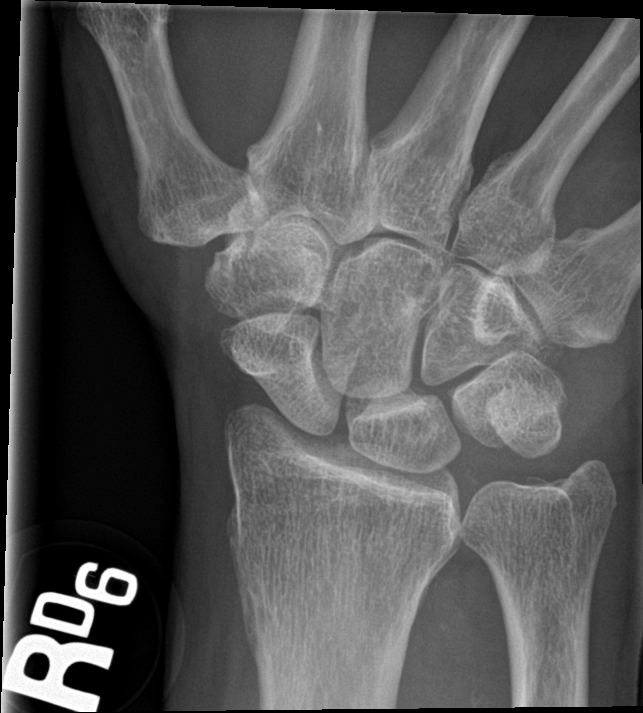

[4 of 4 positions shown; findings below may reference images not displayed]

FINDINGS: There is minimal approximate 2 mm ulnar positive variance. Mild
triscaphe joint space narrowing and peripheral osteophytosis. No
acute fracture or dislocation. Mild vascular calcifications.
IMPRESSION: :
IMPRESSION: 1. Mild triscaphe osteoarthritis.
2. Mild ulnar positive variance.

## 2022-10-07 ENCOUNTER — Other Ambulatory Visit: Payer: Self-pay | Admitting: Family Medicine

## 2022-10-07 NOTE — Telephone Encounter (Signed)
Requested Prescriptions  Pending Prescriptions Disp Refills   metFORMIN (GLUCOPHAGE) 1000 MG tablet [Pharmacy Med Name: metFORMIN HCl 1000 MG Oral Tablet] 180 tablet 0    Sig: Take 1 tablet by mouth twice daily     Endocrinology:  Diabetes - Biguanides Failed - 10/07/2022  9:30 AM      Failed - B12 Level in normal range and within 720 days    No results found for: "VITAMINB12"       Failed - Valid encounter within last 6 months    Recent Outpatient Visits           1 year ago Wrist pain, acute, right   [facility] Pickard, Priscille Heidelberg, MD   1 year ago Bruise   [facility] Pickard, Priscille Heidelberg, MD   1 year ago Colon cancer screening   [facility] Donita Brooks, MD   1 year ago Tick bite of pelvic region, initial encounter   [facility] Donita Brooks, MD   2 years ago Rhinosinusitis   [facility] Tanya Nones, Priscille Heidelberg, MD              Passed - Cr in normal range and within 360 days    Creat  Date Value Ref Range Status  01/09/2022 0.92 0.70 - 1.35 mg/dL Final   Creatinine, Ser  Date Value Ref Range Status  07/04/2022 0.94 0.61 - 1.24 mg/dL Final   Creatinine, Urine  Date Value Ref Range Status  07/08/2016 85 20 - 370 mg/dL Final         Passed - HBA1C is between 0 and 7.9 and within 180 days    Hgb A1c MFr Bld  Date Value Ref Range Status  07/06/2022 6.6 (H) <5.7 % of total Hgb Final    Comment:    For someone without known diabetes, a hemoglobin A1c value of 6.5% or greater indicates that they may have  diabetes and this should be confirmed with a follow-up  test. . For someone with known diabetes, a value <7% indicates  that their diabetes is well controlled and a value  greater than or equal to 7% indicates suboptimal  control. A1c targets should be individualized based on  duration of diabetes, age, comorbid conditions, and  other considerations. . Currently, no consensus  exists regarding use of hemoglobin A1c for diagnosis of diabetes for children. .          Passed - eGFR in normal range and within 360 days    GFR, Est African American  Date Value Ref Range Status  06/28/2020 113 > OR = 60 mL/min/1.75m2 Final   GFR, Est Non African American  Date Value Ref Range Status  06/28/2020 97 > OR = 60 mL/min/1.38m2 Final   GFR, Estimated  Date Value Ref Range Status  07/04/2022 >60 >60 mL/min Final    Comment:    (NOTE) Calculated using the CKD-EPI Creatinine Equation (2021)    eGFR  Date Value Ref Range Status  06/27/2021 98 > OR = 60 mL/min/1.70m2 Final    Comment:    The eGFR is based on the CKD-EPI 2021 equation. To calculate  the new eGFR from a previous Creatinine or Cystatin C result, go to https://www.kidney.org/professionals/ kdoqi/gfr%5Fcalculator          Passed - CBC within normal limits and completed in the last 12 months    WBC  Date Value Ref Range Status  07/04/2022  9.9 4.0 - 10.5 K/uL Final   RBC  Date Value Ref Range Status  07/04/2022 4.72 4.22 - 5.81 MIL/uL Final   Hemoglobin  Date Value Ref Range Status  07/04/2022 15.1 13.0 - 17.0 g/dL Final   HCT  Date Value Ref Range Status  07/04/2022 45.5 39.0 - 52.0 % Final   MCHC  Date Value Ref Range Status  07/04/2022 33.2 30.0 - 36.0 g/dL Final   [facility]  Date Value Ref Range Status  07/04/2022 32.0 26.0 - 34.0 pg Final   MCV  Date Value Ref Range Status  07/04/2022 96.4 80.0 - 100.0 fL Final   No results found for: "PLTCOUNTKUC", "LABPLAT", "POCPLA" RDW  Date Value Ref Range Status  07/04/2022 14.0 11.5 - 15.5 % Final

## 2022-10-08 ENCOUNTER — Other Ambulatory Visit: Payer: Self-pay | Admitting: Family Medicine

## 2022-10-08 NOTE — Telephone Encounter (Signed)
Requested medication (s) are due for refill today- yes   Requested medication (s) are on the active medication list -yes  Future visit scheduled -no  Last refill: 09/14/22- 1 month supply  Notes to clinic: last OV 06/17/22- but chronic diagnosis not addressed- no physical over 1 year- please review for appointment need.  Requested Prescriptions  Pending Prescriptions Disp Refills   JARDIANCE 25 MG TABS tablet [Pharmacy Med Name: JARDIANCE 25MG  TABLET] 30 tablet 0    Sig: TAKE 1 TABLET BY MOUTH ONCE A DAY.     Endocrinology:  Diabetes - SGLT2 Inhibitors Failed - 10/08/2022  4:17 PM      Failed - Valid encounter within last 6 months    Recent Outpatient Visits           1 year ago Wrist pain, acute, right   [facility] Pickard, Priscille Heidelberg, MD   1 year ago Bruise   [facility] Pickard, Priscille Heidelberg, MD   1 year ago Colon cancer screening   [facility] Donita Brooks, MD   1 year ago Tick bite of pelvic region, initial encounter   [facility] Donita Brooks, MD   2 years ago Rhinosinusitis   [facility] Tanya Nones, Priscille Heidelberg, MD              Passed - Cr in normal range and within 360 days    Creat  Date Value Ref Range Status  01/09/2022 0.92 0.70 - 1.35 mg/dL Final   Creatinine, Ser  Date Value Ref Range Status  07/04/2022 0.94 0.61 - 1.24 mg/dL Final   Creatinine, Urine  Date Value Ref Range Status  07/08/2016 85 20 - 370 mg/dL Final         Passed - HBA1C is between 0 and 7.9 and within 180 days    Hgb A1c MFr Bld  Date Value Ref Range Status  07/06/2022 6.6 (H) <5.7 % of total Hgb Final    Comment:    For someone without known diabetes, a hemoglobin A1c value of 6.5% or greater indicates that they may have  diabetes and this should be confirmed with a follow-up  test. . For someone with known diabetes, a value <7% indicates  that their diabetes is well controlled and a value   greater than or equal to 7% indicates suboptimal  control. A1c targets should be individualized based on  duration of diabetes, age, comorbid conditions, and  other considerations. . Currently, no consensus exists regarding use of hemoglobin A1c for diagnosis of diabetes for children. .          Passed - eGFR in normal range and within 360 days    GFR, Est African American  Date Value Ref Range Status  06/28/2020 113 > OR = 60 mL/min/1.59m2 Final   GFR, Est Non African American  Date Value Ref Range Status  06/28/2020 97 > OR = 60 mL/min/1.63m2 Final   GFR, Estimated  Date Value Ref Range Status  07/04/2022 >60 >60 mL/min Final    Comment:    (NOTE) Calculated using the CKD-EPI Creatinine Equation (2021)    eGFR  Date Value Ref Range Status  06/27/2021 98 > OR = 60 mL/min/1.47m2 Final    Comment:    The eGFR is based on the CKD-EPI 2021 equation. To calculate  the new eGFR from a previous Creatinine or Cystatin C result, go to https://www.kidney.org/professionals/ kdoqi/gfr%5Fcalculator  losartan (COZAAR) 50 MG tablet [Pharmacy Med Name: LOSARTAN POTASSIUM 50 MG TAB] 30 tablet 0    Sig: TAKE 1 TABLET BY MOUTH ONCE DAILY.     Cardiovascular:  Angiotensin Receptor Blockers Failed - 10/08/2022  4:17 PM      Failed - Valid encounter within last 6 months    Recent Outpatient Visits           1 year ago Wrist pain, acute, right   [facility] Pickard, Priscille Heidelberg, MD   1 year ago Bruise   [facility] Medicine Pickard, Priscille Heidelberg, MD   1 year ago Colon cancer screening   [facility] Donita Brooks, MD   1 year ago Tick bite of pelvic region, initial encounter   [facility] Donita Brooks, MD   2 years ago Rhinosinusitis   [facility] Tanya Nones, Priscille Heidelberg, MD              Passed - Cr in normal range and within 180 days    Creat  Date Value Ref Range Status  01/09/2022 0.92  0.70 - 1.35 mg/dL Final   Creatinine, Ser  Date Value Ref Range Status  07/04/2022 0.94 0.61 - 1.24 mg/dL Final   Creatinine, Urine  Date Value Ref Range Status  07/08/2016 85 20 - 370 mg/dL Final         Passed - K in normal range and within 180 days    Potassium  Date Value Ref Range Status  07/04/2022 3.8 3.5 - 5.1 mmol/L Final         Passed - Patient is not pregnant      Passed - Last BP in normal range    BP Readings from Last 1 Encounters:  07/04/22 136/70          pioglitazone (ACTOS) 30 MG tablet [Pharmacy Med Name: PIOGLITAZONE HCL 30 MG TABLET] 30 tablet 0    Sig: TAKE 1 TABLET BY MOUTH ONCE DAILY.     Endocrinology:  Diabetes - Glitazones - pioglitazone Failed - 10/08/2022  4:17 PM      Failed - Valid encounter within last 6 months    Recent Outpatient Visits           1 year ago Wrist pain, acute, right   [facility] Medicine Pickard, Priscille Heidelberg, MD   1 year ago Bruise   [facility] Medicine Pickard, Priscille Heidelberg, MD   1 year ago Colon cancer screening   [facility] Donita Brooks, MD   1 year ago Tick bite of pelvic region, initial encounter   [facility] Donita Brooks, MD   2 years ago Rhinosinusitis   [facility] Medicine Pickard, Priscille Heidelberg, MD              Passed - HBA1C is between 0 and 7.9 and within 180 days    Hgb A1c MFr Bld  Date Value Ref Range Status  07/06/2022 6.6 (H) <5.7 % of total Hgb Final    Comment:    For someone without known diabetes, a hemoglobin A1c value of 6.5% or greater indicates that they may have  diabetes and this should be confirmed with a follow-up  test. . For someone with known diabetes, a value <7% indicates  that their diabetes is well controlled and a value  greater than or equal to 7% indicates suboptimal  control. A1c  targets should be individualized based on  duration of diabetes, age, comorbid conditions, and  other  considerations. . Currently, no consensus exists regarding use of hemoglobin A1c for diagnosis of diabetes for children. Marland Kitchen           atorvastatin (LIPITOR) 20 MG tablet [Pharmacy Med Name: ATORVASTATIN 20 MG TABLET] 30 tablet 0    Sig: TAKE 1 TABLET BY MOUTH ONCE A DAY.     Cardiovascular:  Antilipid - Statins Failed - 10/08/2022  4:17 PM      Failed - Valid encounter within last 12 months    Recent Outpatient Visits           1 year ago Wrist pain, acute, right   [facility] Medicine Pickard, Priscille Heidelberg, MD   1 year ago Bruise   [facility] Medicine Pickard, Priscille Heidelberg, MD   1 year ago Colon cancer screening   [facility] Tanya Nones Priscille Heidelberg, MD   1 year ago Tick bite of pelvic region, initial encounter   [facility] Medicine Pickard, Priscille Heidelberg, MD   2 years ago Rhinosinusitis   [facility] Medicine Donita Brooks, MD              Failed - Lipid Panel in normal range within the last 12 months    Cholesterol  Date Value Ref Range Status  06/27/2021 106 <200 mg/dL Final   LDL Cholesterol (Calc)  Date Value Ref Range Status  06/27/2021 36 mg/dL (calc) Final    Comment:    Reference range: <100 . Desirable range <100 mg/dL for primary prevention;   <70 mg/dL for patients with CHD or diabetic patients  with > or = 2 CHD risk factors. Marland Kitchen LDL-C is now calculated using the Martin-Hopkins  calculation, which is a validated novel method providing  better accuracy than the Friedewald equation in the  estimation of LDL-C.  Horald Pollen et al. Lenox Ahr. 5621;308(65): 2061-2068  (http://education.[facility].com/faq/FAQ164)    HDL  Date Value Ref Range Status  06/27/2021 57 > OR = 40 mg/dL Final   Triglycerides  Date Value Ref Range Status  06/27/2021 46 <150 mg/dL Final         Passed - Patient is not pregnant         Requested Prescriptions  Pending Prescriptions Disp Refills   JARDIANCE 25 MG TABS tablet  [Pharmacy Med Name: JARDIANCE 25MG  TABLET] 30 tablet 0    Sig: TAKE 1 TABLET BY MOUTH ONCE A DAY.     Endocrinology:  Diabetes - SGLT2 Inhibitors Failed - 10/08/2022  4:17 PM      Failed - Valid encounter within last 6 months    Recent Outpatient Visits           1 year ago Wrist pain, acute, right   [facility] Pickard, Priscille Heidelberg, MD   1 year ago Bruise   [facility] Medicine Pickard, Priscille Heidelberg, MD   1 year ago Colon cancer screening   [facility] Tanya Nones Priscille Heidelberg, MD   1 year ago Tick bite of pelvic region, initial encounter   [facility] Donita Brooks, MD   2 years ago Rhinosinusitis   [facility] Medicine Pickard, Priscille Heidelberg, MD              Passed - Cr in normal range and within 360 days    Creat  Date Value Ref Range Status  01/09/2022 0.92 0.70 - 1.35 mg/dL Final   Creatinine, Ser  Date Value Ref Range Status  07/04/2022 0.94 0.61 - 1.24 mg/dL Final   Creatinine, Urine  Date Value Ref Range Status  07/08/2016 85 20 - 370 mg/dL Final         Passed - HBA1C is between 0 and 7.9 and within 180 days    Hgb A1c MFr Bld  Date Value Ref Range Status  07/06/2022 6.6 (H) <5.7 % of total Hgb Final    Comment:    For someone without known diabetes, a hemoglobin A1c value of 6.5% or greater indicates that they may have  diabetes and this should be confirmed with a follow-up  test. . For someone with known diabetes, a value <7% indicates  that their diabetes is well controlled and a value  greater than or equal to 7% indicates suboptimal  control. A1c targets should be individualized based on  duration of diabetes, age, comorbid conditions, and  other considerations. . Currently, no consensus exists regarding use of hemoglobin A1c for diagnosis of diabetes for children. .          Passed - eGFR in normal range and within 360 days    GFR, Est African American  Date Value Ref Range Status   06/28/2020 113 > OR = 60 mL/min/1.11m2 Final   GFR, Est Non African American  Date Value Ref Range Status  06/28/2020 97 > OR = 60 mL/min/1.32m2 Final   GFR, Estimated  Date Value Ref Range Status  07/04/2022 >60 >60 mL/min Final    Comment:    (NOTE) Calculated using the CKD-EPI Creatinine Equation (2021)    eGFR  Date Value Ref Range Status  06/27/2021 98 > OR = 60 mL/min/1.62m2 Final    Comment:    The eGFR is based on the CKD-EPI 2021 equation. To calculate  the new eGFR from a previous Creatinine or Cystatin C result, go to https://www.kidney.org/professionals/ kdoqi/gfr%5Fcalculator           losartan (COZAAR) 50 MG tablet [Pharmacy Med Name: LOSARTAN POTASSIUM 50 MG TAB] 30 tablet 0    Sig: TAKE 1 TABLET BY MOUTH ONCE DAILY.     Cardiovascular:  Angiotensin Receptor Blockers Failed - 10/08/2022  4:17 PM      Failed - Valid encounter within last 6 months    Recent Outpatient Visits           1 year ago Wrist pain, acute, right   [facility] Medicine Pickard, Priscille Heidelberg, MD   1 year ago Bruise   [facility] Pickard, Priscille Heidelberg, MD   1 year ago Colon cancer screening   [facility] Tanya Nones Priscille Heidelberg, MD   1 year ago Tick bite of pelvic region, initial encounter   [facility] Medicine Pickard, Priscille Heidelberg, MD   2 years ago Rhinosinusitis   [facility] Donita Brooks, MD              Passed - Cr in normal range and within 180 days    Creat  Date Value Ref Range Status  01/09/2022 0.92 0.70 - 1.35 mg/dL Final   Creatinine, Ser  Date Value Ref Range Status  07/04/2022 0.94 0.61 - 1.24 mg/dL Final   Creatinine, Urine  Date Value Ref Range Status  07/08/2016 85 20 - 370 mg/dL Final         Passed - K in normal range and within  180 days    Potassium  Date Value Ref Range Status  07/04/2022 3.8 3.5 - 5.1 mmol/L Final         Passed - Patient is not pregnant      Passed - Last BP in  normal range    BP Readings from Last 1 Encounters:  07/04/22 136/70          pioglitazone (ACTOS) 30 MG tablet [Pharmacy Med Name: PIOGLITAZONE HCL 30 MG TABLET] 30 tablet 0    Sig: TAKE 1 TABLET BY MOUTH ONCE DAILY.     Endocrinology:  Diabetes - Glitazones - pioglitazone Failed - 10/08/2022  4:17 PM      Failed - Valid encounter within last 6 months    Recent Outpatient Visits           1 year ago Wrist pain, acute, right   [facility] Medicine Pickard, Priscille Heidelberg, MD   1 year ago Bruise   [facility] Medicine Pickard, Priscille Heidelberg, MD   1 year ago Colon cancer screening   [facility] Donita Brooks, MD   1 year ago Tick bite of pelvic region, initial encounter   [facility] Medicine Donita Brooks, MD   2 years ago Rhinosinusitis   [facility] Medicine Pickard, Priscille Heidelberg, MD              Passed - HBA1C is between 0 and 7.9 and within 180 days    Hgb A1c MFr Bld  Date Value Ref Range Status  07/06/2022 6.6 (H) <5.7 % of total Hgb Final    Comment:    For someone without known diabetes, a hemoglobin A1c value of 6.5% or greater indicates that they may have  diabetes and this should be confirmed with a follow-up  test. . For someone with known diabetes, a value <7% indicates  that their diabetes is well controlled and a value  greater than or equal to 7% indicates suboptimal  control. A1c targets should be individualized based on  duration of diabetes, age, comorbid conditions, and  other considerations. . Currently, no consensus exists regarding use of hemoglobin A1c for diagnosis of diabetes for children. Marland Kitchen           atorvastatin (LIPITOR) 20 MG tablet [Pharmacy Med Name: ATORVASTATIN 20 MG TABLET] 30 tablet 0    Sig: TAKE 1 TABLET BY MOUTH ONCE A DAY.     Cardiovascular:  Antilipid - Statins Failed - 10/08/2022  4:17 PM      Failed - Valid encounter within last 12 months    Recent Outpatient Visits            1 year ago Wrist pain, acute, right   [facility] Medicine Pickard, Priscille Heidelberg, MD   1 year ago Bruise   [facility] Medicine Pickard, Priscille Heidelberg, MD   1 year ago Colon cancer screening   [facility] Tanya Nones Priscille Heidelberg, MD   1 year ago Tick bite of pelvic region, initial encounter   [facility] Medicine Donita Brooks, MD   2 years ago Rhinosinusitis   [facility] Medicine Donita Brooks, MD              Failed - Lipid Panel in normal range within the last 12 months    Cholesterol  Date Value Ref Range Status  06/27/2021 106 <200 mg/dL Final   LDL Cholesterol (Calc)  Date  Value Ref Range Status  06/27/2021 36 mg/dL (calc) Final    Comment:    Reference range: <100 . Desirable range <100 mg/dL for primary prevention;   <70 mg/dL for patients with CHD or diabetic patients  with > or = 2 CHD risk factors. Marland Kitchen LDL-C is now calculated using the Martin-Hopkins  calculation, which is a validated novel method providing  better accuracy than the Friedewald equation in the  estimation of LDL-C.  Horald Pollen et al. Lenox Ahr. 4098;119(14): 2061-2068  (http://education.[facility].com/faq/FAQ164)    HDL  Date Value Ref Range Status  06/27/2021 57 > OR = 40 mg/dL Final   Triglycerides  Date Value Ref Range Status  06/27/2021 46 <150 mg/dL Final         Passed - Patient is not pregnant

## 2022-12-10 ENCOUNTER — Other Ambulatory Visit: Payer: Self-pay | Admitting: Family Medicine

## 2022-12-11 ENCOUNTER — Other Ambulatory Visit: Payer: Self-pay

## 2022-12-11 DIAGNOSIS — I10 Essential (primary) hypertension: Secondary | ICD-10-CM

## 2022-12-11 DIAGNOSIS — E118 Type 2 diabetes mellitus with unspecified complications: Secondary | ICD-10-CM

## 2022-12-11 DIAGNOSIS — R7989 Other specified abnormal findings of blood chemistry: Secondary | ICD-10-CM

## 2022-12-11 MED ORDER — PIOGLITAZONE HCL 30 MG PO TABS
30.0000 mg | ORAL_TABLET | Freq: Every day | ORAL | 1 refills | Status: DC
Start: 2022-12-11 — End: 2023-05-31

## 2022-12-11 MED ORDER — ATORVASTATIN CALCIUM 20 MG PO TABS: 20.0000 mg | ORAL_TABLET | Freq: Every day | ORAL | 1 refills | Status: DC

## 2022-12-11 MED ORDER — LOSARTAN POTASSIUM 50 MG PO TABS
50.0000 mg | ORAL_TABLET | Freq: Every day | ORAL | 1 refills | Status: DC
Start: 2022-12-11 — End: 2023-05-31

## 2022-12-11 MED ORDER — EMPAGLIFLOZIN 25 MG PO TABS: 25.0000 mg | ORAL_TABLET | Freq: Every day | ORAL | 1 refills | Status: DC

## 2022-12-31 ENCOUNTER — Ambulatory Visit (INDEPENDENT_AMBULATORY_CARE_PROVIDER_SITE_OTHER): Payer: 59 | Admitting: Family Medicine

## 2022-12-31 VITALS — BP 118/64 | HR 91 | Temp 97.9°F | Ht 67.0 in | Wt 167.4 lb

## 2022-12-31 DIAGNOSIS — M654 Radial styloid tenosynovitis [de Quervain]: Secondary | ICD-10-CM | POA: Diagnosis not present

## 2022-12-31 MED ORDER — MELOXICAM 15 MG PO TABS: 15.0000 mg | ORAL_TABLET | Freq: Every day | ORAL | 4 refills | Status: DC

## 2022-12-31 MED ORDER — HYDROCODONE-ACETAMINOPHEN 5-325 MG PO TABS: 1.0000 | ORAL_TABLET | Freq: Four times a day (QID) | ORAL | 0 refills | Status: DC | PRN

## 2022-12-31 NOTE — Progress Notes (Signed)
Subjective:    Patient ID: Kyle Ray, male    DOB: 24-Aug-1960, 62 y.o.   MRN: [id number]  Patient reports pain in his right hand.  Pain is located on the dorsal surface of the right thumb between the MCP joint and the PIP joint.  He states that it aches and throbs.  It keeps him awake at night.  He has been taking ibuprofen with no relief.  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  His repetitive task that requires him to grasp paper between his thumb and his fingers and then end his left wrist.  I believe he is developing de Quervain's tendinitis.  The patient does not have any pain in the anatomic snuffbox.  There is no pain with passive range of motion of the IP joint.  There is some pain with passive range of motion of the MCP joint.  The patient does have pain with Finkelstein's maneuver Past Medical History:  Diagnosis Date   Allergy    allergic rhinitis   Diabetes mellitus    Elevated LFTs    Hyperlipidemia    Hypertension    Obesity    No past surgical history on file. Current Outpatient Medications on File Prior to Visit  Medication Sig Dispense Refill   aspirin 81 MG tablet Take 81 mg by mouth at bedtime.     atorvastatin (LIPITOR) 20 MG tablet Take 1 tablet (20 mg total) by mouth daily. 90 tablet 1   Beta Carotene (VITAMIN A) 25000 UNIT capsule Take 25,000 Units by mouth daily.     cetirizine (ZYRTEC) 10 MG tablet Take 1 tablet (10 mg total) by mouth daily. 30 tablet 0   cholecalciferol (VITAMIN D) 1000 UNITS tablet Take 1,000 Units by mouth at bedtime.     Cod Liver Oil 1000 MG CAPS Take 1 capsule by mouth daily.     empagliflozin (JARDIANCE) 25 MG TABS tablet Take 1 tablet (25 mg total) by mouth daily. 90 tablet 1   fish oil-omega-3 fatty acids 1000 MG capsule Take 1 g by mouth 2 (two) times daily. 1 in am, 2 in pm     glucose  blood test strip Check BS bid and prn  - Pt uses one touch ultra meter 100 each 12   ibuprofen (ADVIL) 600 MG tablet Take 1 tablet (600 mg total) by mouth every 6 (six) hours as needed. 30 tablet 0   levocetirizine (XYZAL) 5 MG tablet Take 1 tablet (5 mg total) by mouth every evening. 30 tablet 0   losartan (COZAAR) 50 MG tablet Take 1 tablet (50 mg total) by mouth daily. 90 tablet 1   metFORMIN (GLUCOPHAGE) 1000 MG tablet Take 1 tablet by mouth twice daily 180 tablet 0   mometasone (ELOCON) 0.1 % cream Apply topically in the morning and at bedtime. 15 g 1   Multiple Vitamins-Minerals (MULTIVITAMIN PO) Take 1 tablet by mouth daily.     ondansetron (ZOFRAN-ODT) 4 MG disintegrating tablet 4mg  ODT q4 hours prn nausea/vomit 10 tablet 0   pioglitazone (ACTOS) 30 MG tablet Take 1 tablet (30 mg total) by mouth daily. 90 tablet 1   pyridOXINE (VITAMIN B-6) 100 MG tablet Take 100 mg by mouth daily.     TURMERIC PO Take by mouth.     vitamin C (ASCORBIC ACID) 500 MG  tablet Take 500 mg by mouth at bedtime.     No current facility-administered medications on file prior to visit.    No Known Allergies Social History   Socioeconomic History   Marital status: Married    Spouse name: Not on file   Number of children: Not on file   Years of education: Not on file   Highest education level: 12th grade  Occupational History   Not on file  Tobacco Use   Smoking status: Never   Smokeless tobacco: Current    Types: Chew  Substance and Sexual Activity   Alcohol use: No   Drug use: No    Comment: Used "cocaine" as young adult   Sexual activity: Not on file  Other Topics Concern   Not on file  Social History Narrative   Not on file   Social Determinants of Health   Financial Resource Strain: Low Risk  (12/31/2022)   Overall Financial Resource Strain (CARDIA)    Difficulty of Paying Living Expenses: Not hard at all  Food Insecurity: No Food Insecurity (12/31/2022)   Hunger Vital Sign    Worried  About Running Out of Food in the Last Year: Never true    Ran Out of Food in the Last Year: Never true  Transportation Needs: Unknown (12/31/2022)   PRAPARE - Administrator, Civil Service (Medical): Patient declined    Lack of Transportation (Non-Medical): Not on file  Physical Activity: Insufficiently Active (12/31/2022)   Exercise Vital Sign    Days of Exercise per Week: 2 days    Minutes of Exercise per Session: 30 min  Stress: No Stress Concern Present (12/31/2022)   [employer] of Occupational Health - Occupational Stress Questionnaire    Feeling of Stress : Not at all  Social Connections: Unknown (12/31/2022)   Social Connection and Isolation Panel [NHANES]    Frequency of Communication with Friends and Family: Patient declined    Frequency of Social Gatherings with Friends and Family: Patient declined    Attends Religious Services: Patient declined    Database administrator or Organizations: Patient declined    Attends Banker Meetings: Not on file    Marital Status: Patient declined  Intimate Partner Violence: Unknown (07/04/2022)   Received from [facility]   HITS    Physically Hurt: Not on file    Insult or Talk Down To: Not on file    Threaten Physical Harm: Not on file    Scream or Curse: Not on file   Family History  Problem Relation Age of Onset   Healthy Mother    Hyperlipidemia Father    Hypertension Father    Heart disease Father    Alcohol abuse Father       Review of Systems  All other systems reviewed and are negative.      Objective:   Physical Exam Vitals reviewed.  Constitutional:      General: He is not in acute distress.    Appearance: He is well-developed. He is not diaphoretic.  HENT:     Head: Normocephalic and atraumatic.  Neck:     Thyroid: No thyromegaly.     Vascular: No JVD.     Trachea: No tracheal deviation.  Cardiovascular:     Rate and Rhythm: Normal rate and regular rhythm.     Heart sounds:  Normal heart sounds.  Pulmonary:     Effort: Pulmonary effort is normal. No respiratory distress.  Breath sounds: Normal breath sounds. No wheezing, rhonchi or rales.  Musculoskeletal:     Right hand: Tenderness and bony tenderness present. No swelling or deformity. Decreased range of motion. Normal strength. Normal sensation. There is no disruption of two-point discrimination.  Neurological:     Mental Status: He is alert.           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  Begin by wearing a thumb spica splint work to avoid overuse.  Also use meloxicam 15 mg daily as an anti-inflammatory.  I did give him a few hydrocodone to be today at night for severe breakthrough pain.  15 pills will likely last him more than a year.

## 2023-01-05 ENCOUNTER — Other Ambulatory Visit: Payer: Self-pay | Admitting: Family Medicine

## 2023-01-06 NOTE — Telephone Encounter (Signed)
Requested by interface surescripts . Last OV 12/31/22.  Requested Prescriptions  Pending Prescriptions Disp Refills   metFORMIN (GLUCOPHAGE) 1000 MG tablet [Pharmacy Med Name: metFORMIN HCl 1000 MG Oral Tablet] 180 tablet 0    Sig: Take 1 tablet by mouth twice daily     Endocrinology:  Diabetes - Biguanides Failed - 01/06/2023  2:58 PM      Failed - HBA1C is between 0 and 7.9 and within 180 days    Hgb A1c MFr Bld  Date Value Ref Range Status  07/06/2022 6.6 (H) <5.7 % of total Hgb Final    Comment:    For someone without known diabetes, a hemoglobin A1c value of 6.5% or greater indicates that they may have  diabetes and this should be confirmed with a follow-up  test. . For someone with known diabetes, a value <7% indicates  that their diabetes is well controlled and a value  greater than or equal to 7% indicates suboptimal  control. A1c targets should be individualized based on  duration of diabetes, age, comorbid conditions, and  other considerations. . Currently, no consensus exists regarding use of hemoglobin A1c for diagnosis of diabetes for children. .          Failed - B12 Level in normal range and within 720 days    No results found for: "VITAMINB12"       Failed - Valid encounter within last 6 months    Recent Outpatient Visits           1 year ago Wrist pain, acute, right   [facility] Pickard, Priscille Heidelberg, MD   1 year ago Bruise   [facility] Pickard, Priscille Heidelberg, MD   1 year ago Colon cancer screening   [facility] Donita Brooks, MD   2 years ago Tick bite of pelvic region, initial encounter   [facility] Donita Brooks, MD   2 years ago Rhinosinusitis   [facility] Tanya Nones, Priscille Heidelberg, MD              Passed - Cr in normal range and within 360 days    Creat  Date Value Ref Range Status  01/09/2022 0.92 0.70 - 1.35 mg/dL Final   Creatinine, Ser  Date Value Ref  Range Status  07/04/2022 0.94 0.61 - 1.24 mg/dL Final   Creatinine, Urine  Date Value Ref Range Status  07/08/2016 85 20 - 370 mg/dL Final         Passed - eGFR in normal range and within 360 days    GFR, Est African American  Date Value Ref Range Status  06/28/2020 113 > OR = 60 mL/min/1.1m2 Final   GFR, Est Non African American  Date Value Ref Range Status  06/28/2020 97 > OR = 60 mL/min/1.58m2 Final   GFR, Estimated  Date Value Ref Range Status  07/04/2022 >60 >60 mL/min Final    Comment:    (NOTE) Calculated using the CKD-EPI Creatinine Equation (2021)    eGFR  Date Value Ref Range Status  06/27/2021 98 > OR = 60 mL/min/1.18m2 Final    Comment:    The eGFR is based on the CKD-EPI 2021 equation. To calculate  the new eGFR from a previous Creatinine or Cystatin C result, go to https://www.kidney.org/professionals/ kdoqi/gfr%5Fcalculator          Passed - CBC within normal limits and completed in the last 12 months  WBC  Date Value Ref Range Status  07/04/2022 9.9 4.0 - 10.5 K/uL Final   RBC  Date Value Ref Range Status  07/04/2022 4.72 4.22 - 5.81 MIL/uL Final   Hemoglobin  Date Value Ref Range Status  07/04/2022 15.1 13.0 - 17.0 g/dL Final   HCT  Date Value Ref Range Status  07/04/2022 45.5 39.0 - 52.0 % Final   MCHC  Date Value Ref Range Status  07/04/2022 33.2 30.0 - 36.0 g/dL Final   [facility]  Date Value Ref Range Status  07/04/2022 32.0 26.0 - 34.0 pg Final   MCV  Date Value Ref Range Status  07/04/2022 96.4 80.0 - 100.0 fL Final   No results found for: "PLTCOUNTKUC", "LABPLAT", "POCPLA" RDW  Date Value Ref Range Status  07/04/2022 14.0 11.5 - 15.5 % Final

## 2023-01-11 ENCOUNTER — Other Ambulatory Visit: Payer: Self-pay | Admitting: Family Medicine

## 2023-01-11 DIAGNOSIS — I10 Essential (primary) hypertension: Secondary | ICD-10-CM

## 2023-01-11 DIAGNOSIS — E118 Type 2 diabetes mellitus with unspecified complications: Secondary | ICD-10-CM

## 2023-01-12 NOTE — Telephone Encounter (Signed)
Requested Prescriptions  Pending Prescriptions Disp Refills   atorvastatin (LIPITOR) 20 MG tablet [Pharmacy Med Name: ATORVASTATIN 20 MG TABLET] 90 tablet 0    Sig: TAKE ONE TABLET BY MOUTH EVERY DAY     Cardiovascular:  Antilipid - Statins Failed - 01/11/2023  8:54 AM      Failed - Valid encounter within last 12 months    Recent Outpatient Visits           1 year ago Wrist pain, acute, right   [facility] Pickard, Priscille Heidelberg, MD   1 year ago Bruise   [facility] Pickard, Priscille Heidelberg, MD   1 year ago Colon cancer screening   [facility] Donita Brooks, MD   2 years ago Tick bite of pelvic region, initial encounter   [facility] Pickard, Priscille Heidelberg, MD   2 years ago Rhinosinusitis   [facility] Donita Brooks, MD              Failed - Lipid Panel in normal range within the last 12 months    Cholesterol  Date Value Ref Range Status  06/27/2021 106 <200 mg/dL Final   LDL Cholesterol (Calc)  Date Value Ref Range Status  06/27/2021 36 mg/dL (calc) Final    Comment:    Reference range: <100 . Desirable range <100 mg/dL for primary prevention;   <70 mg/dL for patients with CHD or diabetic patients  with > or = 2 CHD risk factors. Marland Kitchen LDL-C is now calculated using the Martin-Hopkins  calculation, which is a validated novel method providing  better accuracy than the Friedewald equation in the  estimation of LDL-C.  Horald Pollen et al. Lenox Ahr. 7253;664(40): 2061-2068  (http://education.[employer].com/faq/FAQ164)    HDL  Date Value Ref Range Status  06/27/2021 57 > OR = 40 mg/dL Final   Triglycerides  Date Value Ref Range Status  06/27/2021 46 <150 mg/dL Final         Passed - Patient is not pregnant       pioglitazone (ACTOS) 30 MG tablet [Pharmacy Med Name: PIOGLITAZONE HCL 30 MG TABLET] 90 tablet 0    Sig: TAKE ONE TABLET BY MOUTH EVERY DAY     Endocrinology:  Diabetes -  Glitazones - pioglitazone Failed - 01/11/2023  8:54 AM      Failed - HBA1C is between 0 and 7.9 and within 180 days    Hgb A1c MFr Bld  Date Value Ref Range Status  07/06/2022 6.6 (H) <5.7 % of total Hgb Final    Comment:    For someone without known diabetes, a hemoglobin A1c value of 6.5% or greater indicates that they may have  diabetes and this should be confirmed with a follow-up  test. . For someone with known diabetes, a value <7% indicates  that their diabetes is well controlled and a value  greater than or equal to 7% indicates suboptimal  control. A1c targets should be individualized based on  duration of diabetes, age, comorbid conditions, and  other considerations. . Currently, no consensus exists regarding use of hemoglobin A1c for diagnosis of diabetes for children. .          Failed - Valid encounter within last 6 months    Recent Outpatient Visits           1 year ago Wrist pain, acute, right   [facility] Pickard, Priscille Heidelberg, MD   1 year  ago Bruise   [facility] Pickard, Priscille Heidelberg, MD   1 year ago Colon cancer screening   [facility] Donita Brooks, MD   2 years ago Tick bite of pelvic region, initial encounter   [facility] Pickard, Priscille Heidelberg, MD   2 years ago Rhinosinusitis   [facility] Pickard, Priscille Heidelberg, MD

## 2023-01-13 ENCOUNTER — Other Ambulatory Visit: Payer: 59

## 2023-01-13 DIAGNOSIS — E118 Type 2 diabetes mellitus with unspecified complications: Secondary | ICD-10-CM

## 2023-01-14 LAB — MICROALBUMIN / CREATININE URINE RATIO
Creatinine, Urine: 72 mg/dL (ref 20–320)
Microalb Creat Ratio: 7 mg/g{creat} (ref <30)
Microalb, Ur: 0.5 mg/dL

## 2023-01-14 LAB — HEMOGLOBIN A1C
Hgb A1c MFr Bld: 6.4 %{Hb} — ABNORMAL HIGH (ref <5.7)
Mean Plasma Glucose: 137 mg/dL
eAG (mmol/L): 7.6 mmol/L

## 2023-01-15 ENCOUNTER — Ambulatory Visit
Admission: EM | Admit: 2023-01-15 | Discharge: 2023-01-15 | Disposition: A | Discharge status: Home / Self Care | Payer: 59 | Attending: Internal Medicine | Admitting: Internal Medicine

## 2023-01-15 ENCOUNTER — Other Ambulatory Visit: Payer: Self-pay

## 2023-01-15 DIAGNOSIS — L259 Unspecified contact dermatitis, unspecified cause: Secondary | ICD-10-CM | POA: Diagnosis not present

## 2023-01-15 MED ORDER — TRIAMCINOLONE ACETONIDE 0.1 % EX CREA
1.0000 | TOPICAL_CREAM | Freq: Two times a day (BID) | CUTANEOUS | 0 refills | Status: DC
Start: 2023-01-15 — End: 2023-11-04

## 2023-01-15 NOTE — ED Triage Notes (Signed)
Pt presents to UC w/ c/o rash on bilateral legs and left arm x2 days. Pt states he was doing yardwork a few days ago.

## 2023-01-15 NOTE — ED Provider Notes (Signed)
[facility]    CSN: [id number] Arrival date & time: 01/15/23  7829      History   Chief Complaint No chief complaint on file.   HPI Kyle Ray is a 62 y.o. male presents for evaluation of a rash.  Patient reports 2 days of a pruritic red rash on his left forearm and left lower leg.  Occurred after working out in his yard.  He is not sure if he came in contact with poison ivy or oak but has had in the past and this seems similar.  Reports some swelling to the area but denies any drainage, fevers, chills, warmth.  Been using calamine lotion which does help his symptoms.  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  No other concerns at this time.  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 No pertinent surgical history.     Home Medications    Prior to Admission medications   Medication Sig Start Date End Date Taking? Authorizing Provider  triamcinolone cream (KENALOG) 0.1 % Apply 1 Application topically 2 (two) times daily. 01/15/23  Yes Radford Pax, NP  aspirin 81 MG tablet Take 81 mg by mouth at bedtime.    [provider]  atorvastatin (LIPITOR) 20 MG tablet Take 1 tablet (20 mg total) by mouth daily. 12/11/22   Donita Brooks, MD  Beta Carotene (VITAMIN A) 25000 UNIT capsule Take 25,000 Units by mouth daily.    [provider]  cetirizine (ZYRTEC) 10 MG tablet Take 1 tablet (10 mg total) by mouth daily. 06/30/19   Wurst, [place], PA-C   cholecalciferol (VITAMIN D) 1000 UNITS tablet Take 1,000 Units by mouth at bedtime.    [provider]  Cod Liver Oil 1000 MG CAPS Take 1 capsule by mouth daily.    [provider]  empagliflozin (JARDIANCE) 25 MG TABS tablet Take 1 tablet (25 mg total) by mouth daily. 12/11/22   Donita Brooks, MD  fish oil-omega-3 fatty acids 1000 MG capsule Take 1 g by mouth 2 (two) times daily. 1 in am, 2 in pm    [provider]  glucose blood test strip Check BS bid and prn  - Pt uses one touch ultra meter 07/27/14   Donita Brooks, MD  HYDROcodone-acetaminophen (NORCO) 5-325 MG tablet Take 1 tablet by mouth every 6 (six) hours as needed for moderate pain. 12/31/22   Donita Brooks, MD  ibuprofen (ADVIL) 600 MG tablet Take 1 tablet (600 mg total) by mouth every 6 (six) hours as needed. 11/29/18   Burgess Amor, PA-C  levocetirizine (XYZAL) 5 MG tablet Take 1 tablet (5 mg total) by mouth every evening. 09/03/20   Donita Brooks, MD  losartan (COZAAR) 50 MG tablet Take 1 tablet (50 mg total) by mouth daily. 12/11/22   Donita Brooks, MD  meloxicam (MOBIC) 15 MG tablet  Take 1 tablet (15 mg total) by mouth daily. 12/31/22   Donita Brooks, MD  metFORMIN (GLUCOPHAGE) 1000 MG tablet Take 1 tablet by mouth twice daily 01/06/23   Donita Brooks, MD  mometasone (ELOCON) 0.1 % cream Apply topically in the morning and at bedtime. 11/07/20   Donita Brooks, MD  Multiple Vitamins-Minerals (MULTIVITAMIN PO) Take 1 tablet by mouth daily.    [provider]  ondansetron (ZOFRAN-ODT) 4 MG disintegrating tablet 4mg  ODT q4 hours prn nausea/vomit 07/04/22   Charlynne Pander, MD  pioglitazone (ACTOS) 30 MG tablet Take 1 tablet (30 mg total) by mouth daily. 12/11/22   Donita Brooks, MD  pyridOXINE (VITAMIN B-6) 100 MG tablet Take 100 mg by mouth daily.    [provider]  TURMERIC PO Take by mouth.    [provider]  vitamin C (ASCORBIC ACID) 500 MG tablet Take  500 mg by mouth at bedtime.    [provider]    Family History Family History  Problem Relation Age of Onset   Healthy Mother    Hyperlipidemia Father    Hypertension Father    Heart disease Father    Alcohol abuse Father     Social History Social History   Tobacco Use   Smoking status: Never   Smokeless tobacco: Current    Types: Chew  Substance Use Topics   Alcohol use: No   Drug use: No    Comment: Used "cocaine" as young adult     Allergies   Patient has no known allergies.   Review of Systems Review of Systems  Skin:  Positive for rash.     Physical Exam Triage Vital Signs ED Triage Vitals  Encounter Vitals Group     BP 01/15/23 1021 (!) 162/80     Systolic BP Percentile --      Diastolic BP Percentile --      Pulse Rate 01/15/23 1021 66     Resp 01/15/23 1021 16     Temp 01/15/23 1021 (!) 97.5 F (36.4 C)     Temp Source 01/15/23 1021 Oral     SpO2 01/15/23 1021 97 %     Weight --      Height --      Head Circumference --      Peak Flow --      Pain Score 01/15/23 1025 0     Pain Loc --      Pain Education --      Exclude from Growth Chart --    No data found.  Updated Vital Signs BP (!) 162/80 (BP Location: Right Arm)   Pulse 66   Temp (!) 97.5 F (36.4 C) (Oral)   Resp 16   SpO2 97%   Visual Acuity Right Eye Distance:   Left Eye Distance:   Bilateral Distance:    Right Eye Near:   Left Eye Near:    Bilateral Near:     Physical Exam Vitals and nursing note reviewed.  Constitutional:      General: He is not in acute distress.    Appearance: Normal appearance. He is not ill-appearing.  HENT:     Head: Normocephalic and atraumatic.  Eyes:     Pupils: Pupils are equal, round, and reactive to light.  Cardiovascular:     Rate and Rhythm: Normal rate.  Pulmonary:     Effort: Pulmonary effort is normal.  Skin:    General: Skin is warm and  dry.     Comments: There is a linear pustular rash with mild erythema on the  left forearm and left lower leg.  No drainage.  Minimal swelling.  Neurological:     General: No focal deficit present.     Mental Status: He is alert and oriented to person, place, and time.  Psychiatric:        Mood and Affect: Mood normal.        Behavior: Behavior normal.      UC Treatments / Results  Labs (all labs ordered are listed, but only abnormal results are displayed) Labs Reviewed - No data to display  EKG   Radiology No results found.  Procedures Procedures (including critical care time)  Medications Ordered in UC Medications - No data to display  Initial Impression / Assessment and Plan / UC Course  I have reviewed the triage vital signs and the nursing notes.  Pertinent labs & imaging results that were available during my care of the patient were reviewed by me and considered in my medical decision making (see chart for details).     Reviewed exam and symptoms with patient.  No red flags.  Will start Kenalog steroid cream topically and he may continue calamine lotion as needed.  Did dress his forearm to protect the rash area while at work.  He may do over-the-counter Benadryl as needed as well.  PCP follow-up if symptoms do not improve.  ER precautions reviewed and patient verbalized understanding. Final Clinical Impressions(s) / UC Diagnoses   Final diagnoses:  Contact dermatitis, unspecified contact dermatitis type, unspecified trigger     Discharge Instructions      Start triamcinolone cream to the rash areas twice daily as needed for itching.  You may continue calamine lotion as needed.  Please follow-up with your PCP if your symptoms do not improve.  Please go to the ER for any worsening symptoms.  Hope you feel better soon!    ED Prescriptions     Medication Sig Dispense Auth. Provider   triamcinolone cream (KENALOG) 0.1 % Apply 1 Application topically 2 (two) times daily. 45 g Radford Pax, NP      PDMP not reviewed this encounter.    Radford Pax, NP 01/15/23 1051

## 2023-01-15 NOTE — Discharge Instructions (Signed)
Start triamcinolone cream to the rash areas twice daily as needed for itching.  You may continue calamine lotion as needed.  Please follow-up with your PCP if your symptoms do not improve.  Please go to the ER for any worsening symptoms.  Hope you feel better soon!

## 2023-01-18 MED ORDER — HYDROCODONE-ACETAMINOPHEN 5-325 MG PO TABS: 1.0000 | ORAL_TABLET | Freq: Four times a day (QID) | ORAL | 0 refills | Status: DC | PRN

## 2023-02-10 ENCOUNTER — Ambulatory Visit: Payer: 59 | Admitting: Family Medicine

## 2023-02-10 VITALS — BP 130/72 | HR 97 | Temp 98.2°F | Ht 67.0 in | Wt 166.6 lb

## 2023-02-10 DIAGNOSIS — M654 Radial styloid tenosynovitis [de Quervain]: Secondary | ICD-10-CM

## 2023-02-10 NOTE — Progress Notes (Unsigned)
Subjective:  HPI: Kyle Ray is a 62 y.o. male presenting on 02/10/2023 for No chief complaint on file.   Hand Pain    Patient is in today for acute pain from trigger finger. 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 He reports those were ineffective and he had to take 2 tablets nightly to help him sleep. He did see orthopedics today and received a cortisone shot. Upon review of his PDMP it does appear he was also prescribed 90 Hydrocodone-Acetaminophen tablets on 01/15/2023 but he cannot recall this. He is currently taking Ibuprofen without relief.  Review of Systems  All other systems reviewed and are negative.   Relevant past medical history reviewed and updated as indicated.   Past Medical History:  Diagnosis Date   Allergy    allergic rhinitis   Diabetes mellitus    Elevated LFTs    Hyperlipidemia    Hypertension    Obesity      No past surgical history on file.  Allergies and medications reviewed and updated.   Current Outpatient Medications:    aspirin 81 MG tablet, Take 81 mg by mouth at bedtime., Disp: , Rfl:    atorvastatin (LIPITOR) 20 MG tablet, Take 1 tablet (20 mg total) by mouth daily., Disp: 90 tablet, Rfl: 1   Beta Carotene (VITAMIN A) 25000 UNIT capsule, Take 25,000 Units by mouth daily., Disp: , Rfl:    cetirizine (ZYRTEC) 10 MG tablet, Take 1 tablet (10 mg total) by mouth daily., Disp: 30 tablet, Rfl: 0   cholecalciferol (VITAMIN D) 1000 UNITS tablet, Take 1,000 Units by mouth at bedtime., Disp: , Rfl:    Cod Liver Oil 1000 MG CAPS, Take 1 capsule by mouth daily., Disp: , Rfl:    empagliflozin (JARDIANCE) 25 MG TABS tablet, Take 1 tablet (25 mg total) by mouth daily., Disp: 90 tablet, Rfl: 1   fish oil-omega-3 fatty acids 1000 MG capsule, Take 1 g by mouth 2 (two) times daily. 1 in am, 2 in pm, Disp: , Rfl:     glucose blood test strip, Check BS bid and prn  - Pt uses one touch ultra meter, Disp: 100 each, Rfl: 12   HYDROcodone-acetaminophen (NORCO) 5-325 MG tablet, Take 1 tablet by mouth every 6 (six) hours as needed for moderate pain., Disp: 15 tablet, Rfl: 0   ibuprofen (ADVIL) 600 MG tablet, Take 1 tablet (600 mg total) by mouth every 6 (six) hours as needed., Disp: 30 tablet, Rfl: 0   levocetirizine (XYZAL) 5 MG tablet, Take 1 tablet (5 mg total) by mouth every evening., Disp: 30 tablet, Rfl: 0   losartan (COZAAR) 50 MG tablet, Take 1 tablet (50 mg total) by mouth daily., Disp: 90 tablet, Rfl: 1   meloxicam (MOBIC) 15 MG tablet, Take 1 tablet (15 mg total) by mouth daily., Disp: 30 tablet, Rfl: 4   metFORMIN (GLUCOPHAGE) 1000 MG tablet, Take 1 tablet by mouth twice daily, Disp: 180 tablet, Rfl: 0   mometasone (ELOCON) 0.1 % cream, Apply topically in the morning and at bedtime., Disp: 15 g, Rfl: 1   Multiple Vitamins-Minerals (MULTIVITAMIN PO), Take 1 tablet by mouth daily., Disp: , Rfl:    ondansetron (ZOFRAN-ODT) 4 MG disintegrating tablet, 4mg  ODT q4 hours prn nausea/vomit, Disp: 10 tablet, Rfl: 0   pioglitazone (ACTOS) 30 MG tablet, Take 1 tablet (30 mg total) by mouth  daily., Disp: 90 tablet, Rfl: 1   pyridOXINE (VITAMIN B-6) 100 MG tablet, Take 100 mg by mouth daily., Disp: , Rfl:    triamcinolone cream (KENALOG) 0.1 %, Apply 1 Application topically 2 (two) times daily., Disp: 45 g, Rfl: 0   TURMERIC PO, Take by mouth., Disp: , Rfl:    vitamin C (ASCORBIC ACID) 500 MG tablet, Take 500 mg by mouth at bedtime., Disp: , Rfl:   No Known Allergies  Objective:   There were no vitals taken for this visit.     01/15/2023   10:21 AM 12/31/2022   10:53 AM 07/04/2022    4:10 PM  Vitals with BMI  Height  5\' 7"    Weight  167 lbs 6 oz   BMI  26.21   Systolic 162 118 147  Diastolic 80 64 70  Pulse 66 91 91     Physical Exam Vitals and nursing note reviewed.  Constitutional:      Appearance:  Normal appearance. He is normal weight.  HENT:     Head: Normocephalic and atraumatic.  Skin:    General: Skin is warm and dry.     Capillary Refill: Capillary refill takes less than 2 seconds.  Neurological:     General: No focal deficit present.     Mental Status: He is alert and oriented to person, place, and time. Mental status is at baseline.  Psychiatric:        Mood and Affect: Mood normal.        Behavior: Behavior normal.        Thought Content: Thought content normal.        Judgment: Judgment normal.     Assessment & Plan:  There are no diagnoses linked to this encounter.   Follow up plan: No follow-ups on file.  Park Meo, FNP

## 2023-02-11 DIAGNOSIS — M654 Radial styloid tenosynovitis [de Quervain]: Secondary | ICD-10-CM | POA: Insufficient documentation

## 2023-02-11 NOTE — Assessment & Plan Note (Signed)
Patient is here today seeking better control of his left hand pain. He did receive a steroid injection today with orthopedics just prior to my appointment with him. I discussed with him that this would take time to take effect. He is returning to their office in 1 month for surgical consideration if pain persists. It appears he did have a prescription of Hydrocodone-Acetaminophen written on 8/23, I encouraged him to check with his pharmacy about this. No documentation regarding his attempt to contact my partner regarding this, I will follow up on this. Return to office if symptoms persist or worsen.

## 2023-02-18 ENCOUNTER — Other Ambulatory Visit: Payer: Self-pay | Admitting: Family Medicine

## 2023-02-18 DIAGNOSIS — E118 Type 2 diabetes mellitus with unspecified complications: Secondary | ICD-10-CM

## 2023-02-18 DIAGNOSIS — I10 Essential (primary) hypertension: Secondary | ICD-10-CM

## 2023-02-18 NOTE — Telephone Encounter (Signed)
Requested Prescriptions  Pending Prescriptions Disp Refills   pioglitazone (ACTOS) 30 MG tablet [Pharmacy Med Name: pioglitazone 30 mg tablet] 90 tablet 1    Sig: TAKE ONE TABLET BY MOUTH EVERY DAY     Endocrinology:  Diabetes - Glitazones - pioglitazone Failed - 02/18/2023  8:40 AM      Failed - Valid encounter within last 6 months    Recent Outpatient Visits           1 year ago Wrist pain, acute, right   [facility] Pickard, Priscille Heidelberg, MD   1 year ago Bruise   [facility] Pickard, Priscille Heidelberg, MD   1 year ago Colon cancer screening   [facility] Donita Brooks, MD   2 years ago Tick bite of pelvic region, initial encounter   [facility] Donita Brooks, MD   2 years ago Rhinosinusitis   [facility] Pickard, Priscille Heidelberg, MD              Passed - HBA1C is between 0 and 7.9 and within 180 days    Hgb A1c MFr Bld  Date Value Ref Range Status  01/13/2023 6.4 (H) <5.7 % of total Hgb Final    Comment:    For someone without known diabetes, a hemoglobin  A1c value between 5.7% and 6.4% is consistent with prediabetes and should be confirmed with a  follow-up test. . For someone with known diabetes, a value <7% indicates that their diabetes is well controlled. A1c targets should be individualized based on duration of diabetes, age, comorbid conditions, and other considerations. . This assay result is consistent with an increased risk of diabetes. . Currently, no consensus exists regarding use of hemoglobin A1c for diagnosis of diabetes for children. Marland Kitchen           atorvastatin (LIPITOR) 20 MG tablet [Pharmacy Med Name: atorvastatin 20 mg tablet] 90 tablet 1    Sig: TAKE ONE TABLET BY MOUTH EVERY DAY     Cardiovascular:  Antilipid - Statins Failed - 02/18/2023  8:40 AM      Failed - Valid encounter within last 12 months    Recent Outpatient Visits           1 year ago Wrist pain, acute,  right   [facility] Pickard, Priscille Heidelberg, MD   1 year ago Bruise   [facility] Pickard, Priscille Heidelberg, MD   1 year ago Colon cancer screening   [facility] Donita Brooks, MD   2 years ago Tick bite of pelvic region, initial encounter   [facility] Pickard, Priscille Heidelberg, MD   2 years ago Rhinosinusitis   [facility] Donita Brooks, MD              Failed - Lipid Panel in normal range within the last 12 months    Cholesterol  Date Value Ref Range Status  06/27/2021 106 <200 mg/dL Final   LDL Cholesterol (Calc)  Date Value Ref Range Status  06/27/2021 36 mg/dL (calc) Final    Comment:    Reference range: <100 . Desirable range <100 mg/dL for primary prevention;   <70 mg/dL for patients with CHD or diabetic patients  with > or = 2 CHD risk factors. Marland Kitchen LDL-C is now calculated using the Martin-Hopkins  calculation, which is a validated novel method providing  better accuracy than  the Friedewald equation in the  estimation of LDL-C.  Horald Pollen et al. Lenox Ahr. 1610;960(45): 2061-2068  (http://education.[facility].com/faq/FAQ164)    HDL  Date Value Ref Range Status  06/27/2021 57 > OR = 40 mg/dL Final   Triglycerides  Date Value Ref Range Status  06/27/2021 46 <150 mg/dL Final         Passed - Patient is not pregnant       losartan (COZAAR) 50 MG tablet [Pharmacy Med Name: losartan 50 mg tablet] 90 tablet 1    Sig: TAKE ONE TABLET BY MOUTH EVERY DAY     Cardiovascular:  Angiotensin Receptor Blockers Failed - 02/18/2023  8:40 AM      Failed - Cr in normal range and within 180 days    Creat  Date Value Ref Range Status  01/09/2022 0.92 0.70 - 1.35 mg/dL Final   Creatinine, Ser  Date Value Ref Range Status  07/04/2022 0.94 0.61 - 1.24 mg/dL Final   Creatinine, Urine  Date Value Ref Range Status  01/13/2023 72 20 - 320 mg/dL Final         Failed - K in normal range and within 180  days    Potassium  Date Value Ref Range Status  07/04/2022 3.8 3.5 - 5.1 mmol/L Final         Failed - Valid encounter within last 6 months    Recent Outpatient Visits           1 year ago Wrist pain, acute, right   [facility] Pickard, Priscille Heidelberg, MD   1 year ago Bruise   [facility] Pickard, Priscille Heidelberg, MD   1 year ago Colon cancer screening   [facility] Donita Brooks, MD   2 years ago Tick bite of pelvic region, initial encounter   [facility] Pickard, Priscille Heidelberg, MD   2 years ago Rhinosinusitis   [facility] Pickard, Priscille Heidelberg, MD              Passed - Patient is not pregnant      Passed - Last BP in normal range    BP Readings from Last 1 Encounters:  02/10/23 130/72          JARDIANCE 25 MG TABS tablet [Pharmacy Med Name: Jardiance 25 mg tablet] 90 tablet 1    Sig: Take 1 tablet (25 mg total) by mouth daily.     Endocrinology:  Diabetes - SGLT2 Inhibitors Failed - 02/18/2023  8:40 AM      Failed - Valid encounter within last 6 months    Recent Outpatient Visits           1 year ago Wrist pain, acute, right   [facility] Pickard, Priscille Heidelberg, MD   1 year ago Bruise   [facility] Pickard, Priscille Heidelberg, MD   1 year ago Colon cancer screening   [facility] Donita Brooks, MD   2 years ago Tick bite of pelvic region, initial encounter   [facility] Pickard, Priscille Heidelberg, MD   2 years ago Rhinosinusitis   [facility] Tanya Nones, Priscille Heidelberg, MD              Passed - Cr in normal range and within 360 days    Creat  Date Value Ref Range Status  01/09/2022 0.92 0.70 - 1.35 mg/dL Final   Creatinine, Ser  Date Value Ref Range Status  07/04/2022 0.94 0.61 - 1.24 mg/dL Final   Creatinine, Urine  Date Value Ref Range Status  01/13/2023 72 20 - 320 mg/dL Final         Passed - HBA1C is between 0 and 7.9  and within 180 days    Hgb A1c MFr Bld  Date Value Ref Range Status  01/13/2023 6.4 (H) <5.7 % of total Hgb Final    Comment:    For someone without known diabetes, a hemoglobin  A1c value between 5.7% and 6.4% is consistent with prediabetes and should be confirmed with a  follow-up test. . For someone with known diabetes, a value <7% indicates that their diabetes is well controlled. A1c targets should be individualized based on duration of diabetes, age, comorbid conditions, and other considerations. . This assay result is consistent with an increased risk of diabetes. . Currently, no consensus exists regarding use of hemoglobin A1c for diagnosis of diabetes for children. .          Passed - eGFR in normal range and within 360 days    GFR, Est African American  Date Value Ref Range Status  06/28/2020 113 > OR = 60 mL/min/1.69m2 Final   GFR, Est Non African American  Date Value Ref Range Status  06/28/2020 97 > OR = 60 mL/min/1.17m2 Final   GFR, Estimated  Date Value Ref Range Status  07/04/2022 >60 >60 mL/min Final    Comment:    (NOTE) Calculated using the CKD-EPI Creatinine Equation (2021)    eGFR  Date Value Ref Range Status  06/27/2021 98 > OR = 60 mL/min/1.82m2 Final    Comment:    The eGFR is based on the CKD-EPI 2021 equation. To calculate  the new eGFR from a previous Creatinine or Cystatin C result, go to https://www.kidney.org/professionals/ kdoqi/gfr%5Fcalculator

## 2023-04-05 ENCOUNTER — Other Ambulatory Visit: Payer: Self-pay | Admitting: Family Medicine

## 2023-04-06 NOTE — Telephone Encounter (Signed)
Requested Prescriptions  Pending Prescriptions Disp Refills   metFORMIN (GLUCOPHAGE) 1000 MG tablet [Pharmacy Med Name: metFORMIN HCl 1000 MG Oral Tablet] 180 tablet 0    Sig: Take 1 tablet by mouth twice daily     Endocrinology:  Diabetes - Biguanides Failed - 04/05/2023  6:52 AM      Failed - B12 Level in normal range and within 720 days    No results found for: "VITAMINB12"       Failed - Valid encounter within last 6 months    Recent Outpatient Visits           1 year ago Wrist pain, acute, right   [facility] Pickard, Priscille Heidelberg, MD   1 year ago Bruise   [facility] Pickard, Priscille Heidelberg, MD   1 year ago Colon cancer screening   [facility] Donita Brooks, MD   2 years ago Tick bite of pelvic region, initial encounter   [facility] Donita Brooks, MD   2 years ago Rhinosinusitis   [facility] Tanya Nones, Priscille Heidelberg, MD              Passed - Cr in normal range and within 360 days    Creat  Date Value Ref Range Status  01/09/2022 0.92 0.70 - 1.35 mg/dL Final   Creatinine, Ser  Date Value Ref Range Status  07/04/2022 0.94 0.61 - 1.24 mg/dL Final   Creatinine, Urine  Date Value Ref Range Status  01/13/2023 72 20 - 320 mg/dL Final         Passed - HBA1C is between 0 and 7.9 and within 180 days    Hgb A1c MFr Bld  Date Value Ref Range Status  01/13/2023 6.4 (H) <5.7 % of total Hgb Final    Comment:    For someone without known diabetes, a hemoglobin  A1c value between 5.7% and 6.4% is consistent with prediabetes and should be confirmed with a  follow-up test. . For someone with known diabetes, a value <7% indicates that their diabetes is well controlled. A1c targets should be individualized based on duration of diabetes, age, comorbid conditions, and other considerations. . This assay result is consistent with an increased risk of diabetes. . Currently, no consensus exists  regarding use of hemoglobin A1c for diagnosis of diabetes for children. .          Passed - eGFR in normal range and within 360 days    GFR, Est African American  Date Value Ref Range Status  06/28/2020 113 > OR = 60 mL/min/1.33m2 Final   GFR, Est Non African American  Date Value Ref Range Status  06/28/2020 97 > OR = 60 mL/min/1.12m2 Final   GFR, Estimated  Date Value Ref Range Status  07/04/2022 >60 >60 mL/min Final    Comment:    (NOTE) Calculated using the CKD-EPI Creatinine Equation (2021)    eGFR  Date Value Ref Range Status  06/27/2021 98 > OR = 60 mL/min/1.43m2 Final    Comment:    The eGFR is based on the CKD-EPI 2021 equation. To calculate  the new eGFR from a previous Creatinine or Cystatin C result, go to https://www.kidney.org/professionals/ kdoqi/gfr%5Fcalculator          Passed - CBC within normal limits and completed in the last 12 months    WBC  Date Value Ref Range Status  07/04/2022 9.9 4.0 - 10.5 K/uL Final  RBC  Date Value Ref Range Status  07/04/2022 4.72 4.22 - 5.81 MIL/uL Final   Hemoglobin  Date Value Ref Range Status  07/04/2022 15.1 13.0 - 17.0 g/dL Final   HCT  Date Value Ref Range Status  07/04/2022 45.5 39.0 - 52.0 % Final   MCHC  Date Value Ref Range Status  07/04/2022 33.2 30.0 - 36.0 g/dL Final   [facility]  Date Value Ref Range Status  07/04/2022 32.0 26.0 - 34.0 pg Final   MCV  Date Value Ref Range Status  07/04/2022 96.4 80.0 - 100.0 fL Final   No results found for: "PLTCOUNTKUC", "LABPLAT", "POCPLA" RDW  Date Value Ref Range Status  07/04/2022 14.0 11.5 - 15.5 % Final

## 2023-05-31 ENCOUNTER — Other Ambulatory Visit: Payer: Self-pay | Admitting: Family Medicine

## 2023-05-31 DIAGNOSIS — I10 Essential (primary) hypertension: Secondary | ICD-10-CM

## 2023-05-31 DIAGNOSIS — E118 Type 2 diabetes mellitus with unspecified complications: Secondary | ICD-10-CM

## 2023-06-07 ENCOUNTER — Other Ambulatory Visit: Payer: Self-pay

## 2023-06-07 DIAGNOSIS — I70209 Unspecified atherosclerosis of native arteries of extremities, unspecified extremity: Secondary | ICD-10-CM

## 2023-06-08 ENCOUNTER — Ambulatory Visit (INDEPENDENT_AMBULATORY_CARE_PROVIDER_SITE_OTHER): Payer: 59 | Admitting: Vascular Surgery

## 2023-06-08 ENCOUNTER — Ambulatory Visit (INDEPENDENT_AMBULATORY_CARE_PROVIDER_SITE_OTHER): Payer: 59

## 2023-06-08 ENCOUNTER — Encounter: Payer: Self-pay | Admitting: Vascular Surgery

## 2023-06-08 VITALS — BP 133/72 | HR 63 | Ht 67.0 in | Wt 173.0 lb

## 2023-06-08 DIAGNOSIS — I70209 Unspecified atherosclerosis of native arteries of extremities, unspecified extremity: Secondary | ICD-10-CM | POA: Diagnosis not present

## 2023-06-08 DIAGNOSIS — I739 Peripheral vascular disease, unspecified: Secondary | ICD-10-CM | POA: Diagnosis not present

## 2023-06-08 LAB — VAS US ABI WITH/WO TBI

## 2023-06-08 NOTE — Progress Notes (Signed)
 [facility] [place]  ASSESSMENT / PLAN: Kyle Ray is a 63 y.o. male with atherosclerosis of native arteries of bilateral lower extremities causing no symptoms.  Recommend:  Abstinence from all tobacco products. Blood glucose control with goal A1c < 7%. Blood pressure control with goal blood pressure < 140/90 mmHg. Lipid reduction therapy with goal LDL-C <100 mg/dL Aspirin  81mg  PO QD.  Atorvastatin  40-80mg  PO QD (or other high intensity statin therapy).  No role for intervention. Follow up with me as needed.  CHIEF COMPLAINT: vascular plaque seen on radiograph  HISTORY OF PRESENT ILLNESS: Kyle Ray is a 63 y.o. male referred to clinic for ration of calcification seen on plain radiographs of lower extremities.  This was done during workup for fall.  The patient reports lower extremity discomfort which occurs when standing for long period time.  He also notices this when he sits in certain positions.  Walking does not seem to worsen the pain, though he does notice the pain while walking.  He spends most of his time at work on his feet.  He does not describe symptoms typical of vascular claudication, ischemic rest pain.  He has no ischemic ulcers about his feet.   Past Medical History:  Diagnosis Date   Allergy    allergic rhinitis   Diabetes mellitus    Elevated LFTs    Hyperlipidemia    Hypertension    Obesity     History reviewed. No pertinent surgical history.  Family History  Problem Relation Age of Onset   Healthy Mother    Hyperlipidemia Father    Hypertension Father    Heart disease Father    Alcohol abuse Father     Social History   Socioeconomic History   Marital status: Single    Spouse name: Not on file   Number of children: Not on file   Years of education: Not on file   Highest education level: 12th grade  Occupational History   Not on file  Tobacco Use   Smoking status: Never   Smokeless tobacco: Current     Types: Chew  Vaping Use   Vaping status: Never Used  Substance and Sexual Activity   Alcohol use: No   Drug use: No    Comment: Used cocaine as young adult   Sexual activity: Not on file  Other Topics Concern   Not on file  Social History Narrative   Not on file   Social Drivers of Health   Financial Resource Strain: Low Risk  (12/31/2022)   Overall Financial Resource Strain (CARDIA)    Difficulty of Paying Living Expenses: Not hard at all  Food Insecurity: No Food Insecurity (12/31/2022)   Hunger Vital Sign    Worried About Running Out of Food in the Last Year: Never true    Ran Out of Food in the Last Year: Never true  Transportation Needs: Unknown (12/31/2022)   PRAPARE - Administrator, Civil Service (Medical): Patient declined    Lack of Transportation (Non-Medical): Not on file  Physical Activity: Insufficiently Active (12/31/2022)   Exercise Vital Sign    Days of Exercise per Week: 2 days    Minutes of Exercise per Session: 30 min  Stress: No Stress Concern Present (12/31/2022)   [employer] of Occupational Health - Occupational Stress Questionnaire    Feeling of Stress : Not at all  Social Connections: Unknown (12/31/2022)   Social Connection and Isolation Panel [NHANES]  Frequency of Communication with Friends and Family: Patient declined    Frequency of Social Gatherings with Friends and Family: Patient declined    Attends Religious Services: Patient declined    Database Administrator or Organizations: Patient declined    Attends Banker Meetings: Not on file    Marital Status: Patient declined  Intimate Partner Violence: Unknown (07/04/2022)   Received from [employer], [facility]   HITS    Physically Hurt: Not on file    Insult or Talk Down To: Not on file    Threaten Physical Harm: Not on file    Scream or Curse: Not on file    No Known Allergies  Current Outpatient Medications  Medication Sig Dispense Refill   aspirin  81 MG  tablet Take 81 mg by mouth at bedtime.     atorvastatin  (LIPITOR) 20 MG tablet TAKE ONE TABLET BY MOUTH EVERY DAY 90 tablet 1   Beta Carotene (VITAMIN A) 25000 UNIT capsule Take 25,000 Units by mouth daily.     cetirizine  (ZYRTEC ) 10 MG tablet Take 1 tablet (10 mg total) by mouth daily. 30 tablet 0   cholecalciferol  (VITAMIN D) 1000 UNITS tablet Take 1,000 Units by mouth at bedtime.     Cod Liver Oil 1000 MG CAPS Take 1 capsule by mouth daily.     fish oil-omega-3 fatty acids 1000 MG capsule Take 1 g by mouth 2 (two) times daily. 1 in am, 2 in pm     glucose blood test strip Check BS bid and prn  - Pt uses one touch ultra meter 100 each 12   HYDROcodone -acetaminophen  (NORCO) 5-325 MG tablet Take 1 tablet by mouth every 6 (six) hours as needed for moderate pain. 15 tablet 0   ibuprofen  (ADVIL ) 600 MG tablet Take 1 tablet (600 mg total) by mouth every 6 (six) hours as needed. 30 tablet 0   JARDIANCE  25 MG TABS tablet Take 1 tablet (25 mg total) by mouth daily. 90 tablet 1   levocetirizine (XYZAL ) 5 MG tablet Take 1 tablet (5 mg total) by mouth every evening. 30 tablet 0   losartan  (COZAAR ) 50 MG tablet TAKE ONE TABLET BY MOUTH EVERY DAY 90 tablet 1   meloxicam  (MOBIC ) 15 MG tablet Take 1 tablet (15 mg total) by mouth daily. 30 tablet 4   metFORMIN  (GLUCOPHAGE ) 1000 MG tablet Take 1 tablet by mouth twice daily 180 tablet 0   mometasone  (ELOCON ) 0.1 % cream Apply topically in the morning and at bedtime. 15 g 1   Multiple Vitamins-Minerals (MULTIVITAMIN PO) Take 1 tablet by mouth daily.     ondansetron  (ZOFRAN -ODT) 4 MG disintegrating tablet 4mg  ODT q4 hours prn nausea/vomit 10 tablet 0   oxyCODONE-acetaminophen  (PERCOCET) 10-325 MG tablet Take 1 tablet by mouth 4 (four) times daily as needed.     pioglitazone  (ACTOS ) 30 MG tablet TAKE ONE TABLET BY MOUTH EVERY DAY 90 tablet 1   pyridOXINE (VITAMIN B-6) 100 MG tablet Take 100 mg by mouth daily.     TURMERIC PO Take by mouth.     vitamin C (ASCORBIC  ACID) 500 MG tablet Take 500 mg by mouth at bedtime.     triamcinolone  cream (KENALOG ) 0.1 % Apply 1 Application topically 2 (two) times daily. (Patient not taking: Reported on 06/08/2023) 45 g 0   No current facility-administered medications for this visit.    PHYSICAL EXAM Vitals:   06/08/23 0831  BP: 133/72  Pulse: 63  Weight: 173  lb (78.5 kg)  Height: 5' 7 (1.702 m)   Elderly man in no distress Unlabored breathing Regular rate and rhythm Palpable dorsalis pedis pulses bilaterally  PERTINENT LABORATORY AND RADIOLOGIC DATA  Most recent CBC    Latest Ref Rng & Units 07/04/2022    2:19 PM 06/27/2021    9:13 AM 12/24/2020    8:13 AM  CBC  WBC 4.0 - 10.5 K/uL 9.9  4.2  5.3   Hemoglobin 13.0 - 17.0 g/dL 84.8  85.5  85.5   Hematocrit 39.0 - 52.0 % 45.5  43.3  43.3   Platelets 150 - 400 K/uL 140  149  162      Most recent CMP    Latest Ref Rng & Units 07/04/2022    2:19 PM 01/09/2022    8:42 AM 06/27/2021    9:13 AM  CMP  Glucose 70 - 99 mg/dL 867  894  893   BUN 8 - 23 mg/dL 21  9  12    Creatinine 0.61 - 1.24 mg/dL 9.05  9.07  9.10   Sodium 135 - 145 mmol/L 138  142  141   Potassium 3.5 - 5.1 mmol/L 3.8  4.3  4.1   Chloride 98 - 111 mmol/L 102  105  102   CO2 22 - 32 mmol/L 28  28  28    Calcium  8.9 - 10.3 mg/dL 9.2  9.6  89.6   Total Protein 6.5 - 8.1 g/dL 7.7  6.9  7.2   Total Bilirubin 0.3 - 1.2 mg/dL 1.4  0.7  1.1   Alkaline Phos 38 - 126 U/L 77     AST 15 - 41 U/L 25  22  20    ALT 0 - 44 U/L 21  17  16      Renal function CrCl cannot be calculated (Patient's most recent lab result is older than the maximum 21 days allowed.).  Hgb A1c MFr Bld (% of total Hgb)  Date Value  01/13/2023 6.4 (H)    LDL Cholesterol (Calc)  Date Value Ref Range Status  06/27/2021 36 mg/dL (calc) Final    Comment:    Reference range: <100 . Desirable range <100 mg/dL for primary prevention;   <70 mg/dL for patients with CHD or diabetic patients  with > or = 2 CHD risk  factors. SABRA LDL-C is now calculated using the Martin-Hopkins  calculation, which is a validated novel method providing  better accuracy than the Friedewald equation in the  estimation of LDL-C.  Gladis APPLETHWAITE et al. SANDREA. 7986;689(80): 2061-2068  (http://education.[facility].com/faq/FAQ164)      +-------+-----------+-----------+------------+------------+  ABI/TBIToday's ABIToday's TBIPrevious ABIPrevious TBI  +-------+-----------+-----------+------------+------------+  Right [place]         0.88                                 +-------+-----------+-----------+------------+------------+  Left  [place]         0.91                                 +-------+-----------+-----------+------------+------------+   Noncompressible ankle-brachial index.  Normal toe pressures.  Debby SAILOR. Magda, MD FACS [facility] Phone Number: [phone] 06/08/2023 8:40 AM   Total time spent on preparing this encounter including chart review, data review, collecting history, examining the patient, coordinating care for this new patient, 60  minutes.  Portions of this report may have been transcribed using voice recognition software.  Every effort has been made to ensure accuracy; however, inadvertent computerized transcription errors may still be present.

## 2023-07-03 ENCOUNTER — Other Ambulatory Visit: Payer: Self-pay | Admitting: Family Medicine

## 2023-07-06 ENCOUNTER — Other Ambulatory Visit: Payer: Self-pay | Admitting: Family Medicine

## 2023-07-16 ENCOUNTER — Other Ambulatory Visit: Payer: Self-pay | Admitting: Family Medicine

## 2023-07-28 ENCOUNTER — Other Ambulatory Visit: Payer: Self-pay | Admitting: Family Medicine

## 2023-08-16 ENCOUNTER — Other Ambulatory Visit: Payer: Self-pay | Admitting: Family Medicine

## 2023-08-17 NOTE — Telephone Encounter (Signed)
 Requested medications are due for refill today.  yes  Requested medications are on the active medications list.  yes  Last refill. 12/31/2022 #30 4 rf  Future visit scheduled.   no  Notes to clinic.  Labs are expired.    Requested Prescriptions  Pending Prescriptions Disp Refills   meloxicam (MOBIC) 15 MG tablet [Pharmacy Med Name: meloxicam 15 mg tablet] 30 tablet 4    Sig: TAKE ONE TABLET BY MOUTH EVERY DAY     Analgesics:  COX2 Inhibitors Failed - 08/17/2023  4:29 PM      Failed - Manual Review: Labs are only required if the patient has taken medication for more than 8 weeks.      Failed - HGB in normal range and within 360 days    Hemoglobin  Date Value Ref Range Status  07/04/2022 15.1 13.0 - 17.0 g/dL Final         Failed - Cr in normal range and within 360 days    Creat  Date Value Ref Range Status  01/09/2022 0.92 0.70 - 1.35 mg/dL Final   Creatinine, Ser  Date Value Ref Range Status  07/04/2022 0.94 0.61 - 1.24 mg/dL Final   Creatinine, Urine  Date Value Ref Range Status  01/13/2023 72 20 - 320 mg/dL Final         Failed - HCT in normal range and within 360 days    HCT  Date Value Ref Range Status  07/04/2022 45.5 39.0 - 52.0 % Final         Failed - AST in normal range and within 360 days    AST  Date Value Ref Range Status  07/04/2022 25 15 - 41 U/L Final         Failed - ALT in normal range and within 360 days    ALT  Date Value Ref Range Status  07/04/2022 21 0 - 44 U/L Final         Failed - eGFR is 30 or above and within 360 days    GFR, Est African American  Date Value Ref Range Status  06/28/2020 113 > OR = 60 mL/min/1.44m2 Final   GFR, Est Non African American  Date Value Ref Range Status  06/28/2020 97 > OR = 60 mL/min/1.45m2 Final   GFR, Estimated  Date Value Ref Range Status  07/04/2022 >60 >60 mL/min Final    Comment:    (NOTE) Calculated using the CKD-EPI Creatinine Equation (2021)    eGFR  Date Value Ref Range Status   06/27/2021 98 > OR = 60 mL/min/1.47m2 Final    Comment:    The eGFR is based on the CKD-EPI 2021 equation. To calculate  the new eGFR from a previous Creatinine or Cystatin C result, go to https://www.kidney.org/professionals/ kdoqi/gfr%5Fcalculator          Failed - Valid encounter within last 12 months    Recent Outpatient Visits           1 year ago Wrist pain, acute, right   [facility] Pickard, Priscille Heidelberg, MD   2 years ago Bruise   [facility] Pickard, Priscille Heidelberg, MD   2 years ago Colon cancer screening   [facility] Donita Brooks, MD   2 years ago Tick bite of pelvic region, initial encounter   [facility] Medicine Pickard, Priscille Heidelberg, MD   2 years ago Rhinosinusitis   [facility] Donita Brooks,  MD              Passed - Patient is not pregnant

## 2023-09-23 ENCOUNTER — Other Ambulatory Visit: Payer: Self-pay | Admitting: Family Medicine

## 2023-09-23 DIAGNOSIS — I10 Essential (primary) hypertension: Secondary | ICD-10-CM

## 2023-09-23 DIAGNOSIS — E118 Type 2 diabetes mellitus with unspecified complications: Secondary | ICD-10-CM

## 2023-09-26 NOTE — Telephone Encounter (Signed)
 Rx for both 05/31/23 #90 1RF- overdue on some labs-and needs appointment Requested Prescriptions  Pending Prescriptions Disp Refills   losartan  (COZAAR ) 50 MG tablet [Pharmacy Med Name: losartan  50 mg tablet] 90 tablet 1    Sig: TAKE ONE TABLET BY MOUTH EVERY DAY     Cardiovascular:  Angiotensin Receptor Blockers Failed - 09/26/2023 12:02 PM      Failed - Cr in normal range and within 180 days    Creat  Date Value Ref Range Status  01/09/2022 0.92 0.70 - 1.35 mg/dL Final   Creatinine, Ser  Date Value Ref Range Status  07/04/2022 0.94 0.61 - 1.24 mg/dL Final   Creatinine, Urine  Date Value Ref Range Status  01/13/2023 72 20 - 320 mg/dL Final         Failed - K in normal range and within 180 days    Potassium  Date Value Ref Range Status  07/04/2022 3.8 3.5 - 5.1 mmol/L Final         Failed - Valid encounter within last 6 months    Recent Outpatient Visits           7 months ago Tenosynovitis, de Public relations account executive   [place] [facility] Jenelle Mis, FNP   8 months ago Tenosynovitis, de Public relations account executive   [place] [facility] Austine Lefort, MD   1 year ago Chronic pain of right wrist   [place] [facility] Pickard, Cisco Crest, MD              Passed - Patient is not pregnant      Passed - Last BP in normal range    BP Readings from Last 1 Encounters:  06/08/23 133/72          pioglitazone  (ACTOS ) 30 MG tablet [Pharmacy Med Name: pioglitazone  30 mg tablet] 90 tablet 1    Sig: TAKE ONE TABLET BY MOUTH EVERY DAY     Endocrinology:  Diabetes - Glitazones - pioglitazone  Failed - 09/26/2023 12:02 PM      Failed - HBA1C is between 0 and 7.9 and within 180 days    Hgb A1c MFr Bld  Date Value Ref Range Status  01/13/2023 6.4 (H) <5.7 % of total Hgb Final    Comment:    For someone without known diabetes, a hemoglobin  A1c value between 5.7% and 6.4% is consistent with prediabetes and should be confirmed with a  follow-up  test. . For someone with known diabetes, a value <7% indicates that their diabetes is well controlled. A1c targets should be individualized based on duration of diabetes, age, comorbid conditions, and other considerations. . This assay result is consistent with an increased risk of diabetes. . Currently, no consensus exists regarding use of hemoglobin A1c for diagnosis of diabetes for children. .          Failed - Valid encounter within last 6 months    Recent Outpatient Visits           7 months ago Tenosynovitis, de Public relations account executive   [place] [facility] Jenelle Mis, FNP   8 months ago Tenosynovitis, de [place]   [place] [facility] Pickard, Cisco Crest, MD   1 year ago Chronic pain of right wrist   [place] [facility] Pickard, Cisco Crest, MD

## 2023-09-27 ENCOUNTER — Ambulatory Visit
Admission: EM | Admit: 2023-09-27 | Discharge: 2023-09-27 | Disposition: A | Discharge status: Home / Self Care | Attending: Emergency Medicine | Admitting: Emergency Medicine

## 2023-09-27 DIAGNOSIS — R21 Rash and other nonspecific skin eruption: Secondary | ICD-10-CM

## 2023-09-27 DIAGNOSIS — R0981 Nasal congestion: Secondary | ICD-10-CM

## 2023-09-27 MED ORDER — PREDNISONE 10 MG (21) PO TBPK: ORAL_TABLET | Freq: Every day | ORAL | 0 refills | Status: DC

## 2023-09-27 MED ORDER — AZITHROMYCIN 250 MG PO TABS: 250.0000 mg | ORAL_TABLET | Freq: Every day | ORAL | 0 refills | Status: AC

## 2023-09-27 MED ORDER — IPRATROPIUM BROMIDE 0.03 % NA SOLN: 2.0000 | Freq: Two times a day (BID) | NASAL | 0 refills | Status: DC

## 2023-09-27 NOTE — ED Provider Notes (Signed)
 Kyle Ray    CSN: [id number] Arrival date & time: 09/27/23  1105      History   Chief Complaint Chief Complaint  Patient presents with   Rash    HPI Kyle Ray is a 63 y.o. male.   Patient presents for evaluation of pruritus to the bilateral eyes, nasal congestion, rhinorrhea and a nonproductive cough present for 7 days.  Has been taking antihistamines and Mucinex with minimal relief.  No known sick contacts.  Tolerating food and liquids.  Denies ear pain, sore throat, sinus pressure, shortness of breath or wheezing.  No known sick contacts.  History of seasonal allergies.  Patient endorses a erythematous mildly pruritic rash present to the bilateral lower extremities beginning 3 days ago after mowing the lawn.  Denies drainage, fever.  Denies changes in trees, diet or medications.    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 No pertinent surgical history.     Home Medications    Prior to Admission medications   Medication Sig Start Date End Date Taking? Authorizing Provider  azithromycin  (ZITHROMAX ) 250 MG tablet Take 1 tablet (250 mg total) by mouth daily. Take first 2 tablets together, then 1 every day until finished. 09/27/23  Yes Willow Shidler R, NP  ipratropium (ATROVENT) 0.03 % nasal spray Place 2 sprays into both nostrils every 12 (twelve) hours. 09/27/23  Yes Elonzo Sopp R, NP  predniSONE  (STERAPRED UNI-PAK 21 TAB) 10 MG (21) TBPK tablet Take by mouth daily. Take  6 tabs by mouth daily  for 1 days, then 5 tabs for 1 days, then 4 tabs for 1 days, then 3 tabs for 1 days, 2 tabs for 1 days, then 1 tab by mouth daily for 1 days 09/27/23  Yes Crosby Oriordan R, NP  aspirin  81 MG tablet Take 81 mg by mouth at bedtime.    [provider]  atorvastatin  (LIPITOR) 20 MG tablet TAKE ONE TABLET BY MOUTH EVERY DAY 05/31/23   Austine Lefort, MD  Beta Carotene (VITAMIN A) 25000 UNIT capsule Take 25,000 Units by mouth daily.    [provider]  cetirizine  (ZYRTEC ) 10 MG tablet Take 1 tablet (10 mg total) by mouth daily. 06/30/19   Wurst, [place], PA-C  cholecalciferol  (VITAMIN D) 1000 UNITS tablet Take 1,000 Units by mouth at bedtime.    [provider]  Cod Liver Oil 1000 MG CAPS Take 1 capsule by mouth daily.    [provider]  fish oil-omega-3 fatty acids 1000 MG capsule Take 1 g by mouth 2 (two) times daily. 1 in am, 2 in pm    [provider]  glucose blood test strip Check BS bid and prn  - Pt uses one touch ultra meter 07/27/14   Austine Lefort, MD  HYDROcodone -acetaminophen  (NORCO) 5-325 MG tablet Take 1 tablet by mouth every 6 (six) hours as needed for moderate pain. 01/18/23   Austine Lefort, MD  ibuprofen  (ADVIL ) 600 MG tablet Take 1 tablet (600 mg total) by mouth every 6 (six) hours as needed. 11/29/18   Idol, Julie, PA-C  JARDIANCE  25 MG TABS tablet Take 1 tablet (25 mg total) by mouth daily. 05/31/23   Austine Lefort, MD  levocetirizine (XYZAL ) 5 MG tablet Take 1 tablet (5 mg total) by mouth every evening. 09/03/20   Austine Lefort, MD  losartan  (COZAAR ) 50 MG tablet TAKE ONE TABLET BY MOUTH EVERY DAY 05/31/23   Austine Lefort, MD  meloxicam  (MOBIC ) 15 MG tablet TAKE ONE TABLET BY MOUTH EVERY DAY 08/17/23   Austine Lefort, MD  metFORMIN  (GLUCOPHAGE ) 1000 MG tablet Take 1 tablet by mouth twice daily 07/05/23   Austine Lefort, MD  mometasone  (ELOCON ) 0.1 % cream Apply topically in the morning and at bedtime.  11/07/20   Austine Lefort, MD  Multiple Vitamins-Minerals (MULTIVITAMIN PO) Take 1 tablet by mouth daily.    [provider]  ondansetron  (ZOFRAN -ODT) 4 MG disintegrating tablet 4mg  ODT q4 hours prn nausea/vomit 07/04/22   Dalene Duck, MD  oxyCODONE-acetaminophen  (PERCOCET) 10-325 MG tablet Take 1 tablet by mouth 4 (four) times daily as needed. 05/24/23   [provider]  pioglitazone  (ACTOS ) 30 MG tablet TAKE ONE TABLET BY MOUTH EVERY DAY 05/31/23   Austine Lefort, MD  pyridOXINE (VITAMIN B-6) 100 MG tablet Take 100 mg by mouth daily.    [provider]  triamcinolone  cream (KENALOG ) 0.1 % Apply 1 Application topically 2 (two) times daily. Patient not taking: Reported on 06/08/2023 01/15/23   Mayer, Jodi R, NP  TURMERIC PO Take by mouth.    [provider]  vitamin C (ASCORBIC ACID) 500 MG tablet Take 500 mg by mouth at bedtime.    [provider]    Family History Family History  Problem Relation Age of Onset   Healthy Mother    Hyperlipidemia Father    Hypertension Father    Heart disease Father    Alcohol abuse Father     Social History Social History   Tobacco Use   Smoking status: Never   Smokeless tobacco: Current    Types: Chew  Vaping Use   Vaping status: Never Used  Substance Use Topics   Alcohol use: No   Drug use: No    Comment: Used "cocaine" as young adult     Allergies   Hydrocodone    Review of Systems Review of Systems  Skin:  Positive for rash.     Physical Exam Triage Vital Signs ED Triage Vitals  Encounter Vitals Group     BP 09/27/23 1118 130/68     Systolic BP Percentile --      Diastolic BP Percentile --      Pulse Rate 09/27/23 1118 81     Resp 09/27/23 1118 16     Temp 09/27/23 1118 (!) 97.2 F (36.2 C)     Temp Source 09/27/23 1118 Oral     SpO2 09/27/23 1118 98 %     Weight --      Height --      Head Circumference --      Peak Flow --      Pain Score 09/27/23 1119 0      Pain Loc --      Pain Education --      Exclude from Growth Chart --    No data found.  Updated Vital Signs BP 130/68 (BP Location: Right  Arm)   Pulse 81   Temp (!) 97.2 F (36.2 C) (Oral)   Resp 16   SpO2 98%   Visual Acuity Right Eye Distance:   Left Eye Distance:   Bilateral Distance:    Right Eye Near:   Left Eye Near:    Bilateral Near:     Physical Exam Constitutional:      Appearance: Normal appearance.  HENT:     Head: Normocephalic.     Right Ear: Tympanic membrane, ear canal and external ear normal.     Left Ear: Tympanic membrane, ear canal and external ear normal.     Nose: Congestion present.     Mouth/Throat:     Pharynx: No oropharyngeal exudate or posterior oropharyngeal erythema.  Eyes:     Comments: No erythema or drainage noted to the eyes, vision grossly intact, extraocular movements intact  Cardiovascular:     Rate and Rhythm: Normal rate and regular rhythm.     Pulses: Normal pulses.     Heart sounds: Normal heart sounds.  Pulmonary:     Effort: Pulmonary effort is normal.     Breath sounds: Normal breath sounds.  Skin:    Comments: Erythematous macular rash present to the anterior of the bilateral shin, nondraining, nontender  Neurological:     Mental Status: He is alert and oriented to person, place, and time.      UC Treatments / Results  Labs (all labs ordered are listed, but only abnormal results are displayed) Labs Reviewed - No data to display  EKG   Radiology No results found.  Procedures Procedures (including critical care time)  Medications Ordered in UC Medications - No data to display  Initial Impression / Assessment and Plan / UC Course  I have reviewed the triage vital signs and the nursing notes.  Pertinent labs & imaging results that were available during my care of the patient were reviewed by me and considered in my medical decision making (see chart for details).  Nasal congestion, rash  Etiology most  likely related to seasonal allergies however patient endorses mucus is changed from clear to green in color and is progressively worsening therefore will provide bacterial coverage as symptoms have been present for a week, azithromycin  sent to pharmacy as well as ipratropium nasal spray, recommended continued over-the-counter medications and additional nonpharmacological measures, may follow-up as needed  Rash is consistent with inflammatory process, no signs of infection, discussed with patient, scribed oral steroids and recommended over-the-counter medications for management of pruritus, may follow-up with urgent care as needed Final Clinical Impressions(s) / UC Diagnoses   Final diagnoses:  Rash and nonspecific skin eruption  Nasal congestion     Discharge Instructions      Today you were evaluated for your nasal congestion which could be related to the weather change versus a germ  Treating prophylactically for germs with azithromycin , take as directed, this is an antibiotic  Use nasal spray twice daily to clear out the sinuses and help reduce congestion, may continue to take Mucinex as needed in addition to this    You can take Tylenol  and/or Ibuprofen  as needed for fever reduction and pain relief.   For congestion: take a daily anti-histamine like Zyrtec , Claritin, and a oral decongestant, such as pseudoephedrine.  You can also use Flonase  1-2 sprays in each nostril daily.   It is important to stay hydrated: drink plenty of fluids (water, gatorade/powerade/pedialyte, juices, or teas) to keep your throat moisturized and help  further relieve irritation/discomfort.  Today you are being treated for the rash appears inflammatory, no signs of infection to the skin  take prednisone  every morning with food as directed, to continue the above process to help reduce the inflammatory process that occurs with this rash which will help minimize your itching as well as begin to clear, avoid use of  ibuprofen  during treatment  You may continue use of topical calamine or Benadryl  cream to help manage itching, you may also continue oral Benadryl   Please avoid long exposures to heat such as a hot steamy shower or being outside as this may cause further irritation to your rash  You may follow-up with his urgent care as needed if symptoms persist or worsen     ED Prescriptions     Medication Sig Dispense Auth. Provider   predniSONE  (STERAPRED UNI-PAK 21 TAB) 10 MG (21) TBPK tablet Take by mouth daily. Take 6 tabs by mouth daily  for 1 days, then 5 tabs for 1 days, then 4 tabs for 1 days, then 3 tabs for 1 days, 2 tabs for 1 days, then 1 tab by mouth daily for 1 days 21 tablet Rahmah Mccamy R, NP   azithromycin  (ZITHROMAX ) 250 MG tablet Take 1 tablet (250 mg total) by mouth daily. Take first 2 tablets together, then 1 every day until finished. 6 tablet Tilak Oakley R, NP   ipratropium (ATROVENT) 0.03 % nasal spray Place 2 sprays into both nostrils every 12 (twelve) hours. 30 mL Reena Canning, NP      PDMP not reviewed this encounter.   Reena Canning, NP 09/27/23 1141

## 2023-09-27 NOTE — ED Triage Notes (Signed)
 Patient presents to UC for itchy eyes, congestion, rash on legs x 1 week. States he think the rash is a poison ivy rash. Has been taking allergy meds, mucinex, and benadryl .

## 2023-09-27 NOTE — Discharge Instructions (Signed)
 Today you were evaluated for your nasal congestion which could be related to the weather change versus a germ  Treating prophylactically for germs with azithromycin , take as directed, this is an antibiotic  Use nasal spray twice daily to clear out the sinuses and help reduce congestion, may continue to take Mucinex as needed in addition to this    You can take Tylenol  and/or Ibuprofen  as needed for fever reduction and pain relief.   For congestion: take a daily anti-histamine like Zyrtec , Claritin, and a oral decongestant, such as pseudoephedrine.  You can also use Flonase  1-2 sprays in each nostril daily.   It is important to stay hydrated: drink plenty of fluids (water, gatorade/powerade/pedialyte, juices, or teas) to keep your throat moisturized and help further relieve irritation/discomfort.  Today you are being treated for the rash appears inflammatory, no signs of infection to the skin  take prednisone  every morning with food as directed, to continue the above process to help reduce the inflammatory process that occurs with this rash which will help minimize your itching as well as begin to clear, avoid use of ibuprofen  during treatment  You may continue use of topical calamine or Benadryl  cream to help manage itching, you may also continue oral Benadryl   Please avoid long exposures to heat such as a hot steamy shower or being outside as this may cause further irritation to your rash  You may follow-up with his urgent care as needed if symptoms persist or worsen

## 2023-10-04 ENCOUNTER — Other Ambulatory Visit: Payer: Self-pay | Admitting: Family Medicine

## 2023-10-05 NOTE — Telephone Encounter (Signed)
 Requested medication (s) are due for refill today: yes  Requested medication (s) are on the active medication list: yes  Last refill:  07/05/23  Future visit scheduled: yes  Notes to clinic:  overdue appt and labs    Requested Prescriptions  Pending Prescriptions Disp Refills   metFORMIN  (GLUCOPHAGE ) 1000 MG tablet [Pharmacy Med Name: metFORMIN  HCl 1000 MG Oral Tablet] 180 tablet 0    Sig: Take 1 tablet by mouth twice daily     Endocrinology:  Diabetes - Biguanides Failed - 10/05/2023  4:11 PM      Failed - Cr in normal range and within 360 days    Creat  Date Value Ref Range Status  01/09/2022 0.92 0.70 - 1.35 mg/dL Final   Creatinine, Ser  Date Value Ref Range Status  07/04/2022 0.94 0.61 - 1.24 mg/dL Final   Creatinine, Urine  Date Value Ref Range Status  01/13/2023 72 20 - 320 mg/dL Final         Failed - HBA1C is between 0 and 7.9 and within 180 days    Hgb A1c MFr Bld  Date Value Ref Range Status  01/13/2023 6.4 (H) <5.7 % of total Hgb Final    Comment:    For someone without known diabetes, a hemoglobin  A1c value between 5.7% and 6.4% is consistent with prediabetes and should be confirmed with a  follow-up test. . For someone with known diabetes, a value <7% indicates that their diabetes is well controlled. A1c targets should be individualized based on duration of diabetes, age, comorbid conditions, and other considerations. . This assay result is consistent with an increased risk of diabetes. . Currently, no consensus exists regarding use of hemoglobin A1c for diagnosis of diabetes for children. .          Failed - eGFR in normal range and within 360 days    GFR, Est African American  Date Value Ref Range Status  06/28/2020 113 > OR = 60 mL/min/1.7m2 Final   GFR, Est Non African American  Date Value Ref Range Status  06/28/2020 97 > OR = 60 mL/min/1.63m2 Final   GFR, Estimated  Date Value Ref Range Status  07/04/2022 >60 >60 mL/min Final     Comment:    (NOTE) Calculated using the CKD-EPI Creatinine Equation (2021)    eGFR  Date Value Ref Range Status  06/27/2021 98 > OR = 60 mL/min/1.35m2 Final    Comment:    The eGFR is based on the CKD-EPI 2021 equation. To calculate  the new eGFR from a previous Creatinine or Cystatin C result, go to https://www.kidney.org/professionals/ kdoqi/gfr%5Fcalculator          Failed - B12 Level in normal range and within 720 days    No results found for: "VITAMINB12"       Failed - Valid encounter within last 6 months    Recent Outpatient Visits           7 months ago Tenosynovitis, de Public relations account executive   [facility] Jenelle Mis, FNP   9 months ago Tenosynovitis, de Public relations account executive   [facility] Pickard, Cisco Crest, MD   1 year ago Chronic pain of right wrist    [facility] Austine Lefort, MD              Failed - CBC within normal limits and completed in the last 12 months    WBC  Date Value  Ref Range Status  07/04/2022 9.9 4.0 - 10.5 K/uL Final   RBC  Date Value Ref Range Status  07/04/2022 4.72 4.22 - 5.81 MIL/uL Final   Hemoglobin  Date Value Ref Range Status  07/04/2022 15.1 13.0 - 17.0 g/dL Final   HCT  Date Value Ref Range Status  07/04/2022 45.5 39.0 - 52.0 % Final   MCHC  Date Value Ref Range Status  07/04/2022 33.2 30.0 - 36.0 g/dL Final   [facility]  Date Value Ref Range Status  07/04/2022 32.0 26.0 - 34.0 pg Final   MCV  Date Value Ref Range Status  07/04/2022 96.4 80.0 - 100.0 fL Final   No results found for: "PLTCOUNTKUC", "LABPLAT", "POCPLA" RDW  Date Value Ref Range Status  07/04/2022 14.0 11.5 - 15.5 % Final

## 2023-10-06 ENCOUNTER — Telehealth: Payer: Self-pay

## 2023-10-06 NOTE — Telephone Encounter (Signed)
 Copied from CRM [id number]. Topic: Clinical - Lab/Test Results >> Oct 06, 2023  1:07 PM Geneva B wrote: Reason for CRM: patient needs lab order for lab work 10/07/2023

## 2023-10-07 ENCOUNTER — Other Ambulatory Visit

## 2023-10-07 DIAGNOSIS — E6609 Other obesity due to excess calories: Secondary | ICD-10-CM

## 2023-10-07 DIAGNOSIS — Z13220 Encounter for screening for lipoid disorders: Secondary | ICD-10-CM

## 2023-10-07 DIAGNOSIS — E118 Type 2 diabetes mellitus with unspecified complications: Secondary | ICD-10-CM

## 2023-10-07 DIAGNOSIS — R7989 Other specified abnormal findings of blood chemistry: Secondary | ICD-10-CM

## 2023-10-07 DIAGNOSIS — I10 Essential (primary) hypertension: Secondary | ICD-10-CM

## 2023-10-08 ENCOUNTER — Ambulatory Visit: Payer: Self-pay | Admitting: Family Medicine

## 2023-10-08 LAB — COMPLETE METABOLIC PANEL WITHOUT GFR
AG Ratio: 2.2 (calc) (ref 1.0–2.5)
ALT: 19 U/L (ref 9–46)
AST: 18 U/L (ref 10–35)
Albumin: 4.4 g/dL (ref 3.6–5.1)
Alkaline phosphatase (APISO): 66 U/L (ref 35–144)
BUN: 15 mg/dL (ref 7–25)
CO2: 27 mmol/L (ref 20–32)
Calcium: 9.3 mg/dL (ref 8.6–10.3)
Chloride: 104 mmol/L (ref 98–110)
Creat: 0.8 mg/dL (ref 0.70–1.35)
Globulin: 2 g/dL (ref 1.9–3.7)
Glucose, Bld: 122 mg/dL — ABNORMAL HIGH (ref 65–99)
Potassium: 4.1 mmol/L (ref 3.5–5.3)
Sodium: 141 mmol/L (ref 135–146)
Total Bilirubin: 1.3 mg/dL — ABNORMAL HIGH (ref 0.2–1.2)
Total Protein: 6.4 g/dL (ref 6.1–8.1)

## 2023-10-08 LAB — CBC WITH DIFFERENTIAL/PLATELET
Absolute Lymphocytes: 2221 {cells}/uL (ref 850–3900)
Absolute Monocytes: 592 {cells}/uL (ref 200–950)
Basophils Absolute: 41 {cells}/uL (ref 0–200)
Basophils Relative: 0.7 %
Eosinophils Absolute: 180 {cells}/uL (ref 15–500)
Eosinophils Relative: 3.1 %
HCT: 41.9 % (ref 38.5–50.0)
Hemoglobin: 13.9 g/dL (ref 13.2–17.1)
MCH: 32.9 pg (ref 27.0–33.0)
MCHC: 33.2 g/dL (ref 32.0–36.0)
MCV: 99.1 fL (ref 80.0–100.0)
MPV: 9.1 fL (ref 7.5–12.5)
Monocytes Relative: 10.2 %
Neutro Abs: 2767 {cells}/uL (ref 1500–7800)
Neutrophils Relative %: 47.7 %
Platelets: 156 10*3/uL (ref 140–400)
RBC: 4.23 10*6/uL (ref 4.20–5.80)
RDW: 12.8 % (ref 11.0–15.0)
Total Lymphocyte: 38.3 %
WBC: 5.8 10*3/uL (ref 3.8–10.8)

## 2023-10-08 LAB — LIPID PANEL
Cholesterol: 116 mg/dL (ref <200)
HDL: 66 mg/dL (ref >=40)
LDL Cholesterol (Calc): 36 mg/dL
Non-HDL Cholesterol (Calc): 50 mg/dL (ref <130)
Total CHOL/HDL Ratio: 1.8 (calc) (ref <5.0)
Triglycerides: 48 mg/dL (ref <150)

## 2023-10-08 LAB — HEMOGLOBIN A1C
Hgb A1c MFr Bld: 6.9 % — ABNORMAL HIGH (ref <5.7)
Mean Plasma Glucose: 151 mg/dL
eAG (mmol/L): 8.4 mmol/L

## 2023-10-28 ENCOUNTER — Encounter: Admitting: Family Medicine

## 2023-11-04 ENCOUNTER — Ambulatory Visit (INDEPENDENT_AMBULATORY_CARE_PROVIDER_SITE_OTHER)

## 2023-11-04 ENCOUNTER — Encounter: Payer: Self-pay | Admitting: Family Medicine

## 2023-11-04 ENCOUNTER — Ambulatory Visit (INDEPENDENT_AMBULATORY_CARE_PROVIDER_SITE_OTHER): Admitting: Family Medicine

## 2023-11-04 VITALS — BP 118/60 | HR 82 | Ht 67.0 in | Wt 179.0 lb

## 2023-11-04 DIAGNOSIS — E118 Type 2 diabetes mellitus with unspecified complications: Secondary | ICD-10-CM

## 2023-11-04 DIAGNOSIS — Z1211 Encounter for screening for malignant neoplasm of colon: Secondary | ICD-10-CM | POA: Diagnosis not present

## 2023-11-04 DIAGNOSIS — Z0000 Encounter for general adult medical examination without abnormal findings: Secondary | ICD-10-CM

## 2023-11-04 DIAGNOSIS — Z0001 Encounter for general adult medical examination with abnormal findings: Secondary | ICD-10-CM | POA: Diagnosis not present

## 2023-11-04 DIAGNOSIS — Z125 Encounter for screening for malignant neoplasm of prostate: Secondary | ICD-10-CM

## 2023-11-04 DIAGNOSIS — I10 Essential (primary) hypertension: Secondary | ICD-10-CM

## 2023-11-04 DIAGNOSIS — Z7984 Long term (current) use of oral hypoglycemic drugs: Secondary | ICD-10-CM

## 2023-11-04 LAB — HM DIABETES EYE EXAM

## 2023-11-04 NOTE — Progress Notes (Signed)
 Kyle Ray arrived 11/04/2023 and has given verbal consent to obtain images and complete their overdue diabetic retinal screening.  The images have been sent to an ophthalmologist or optometrist for review and interpretation.  Results will be sent back to Austine Lefort, MD for review.  Patient has been informed they will be contacted when we receive the results via telephone or MyChart  Vallerie Gave, CMA

## 2023-11-04 NOTE — Progress Notes (Signed)
 Subjective:    Patient ID: Kyle Ray, male    DOB: 05-May-1961, 63 y.o.   MRN: [id number] Patient is a 63 year old gentleman here today for complete physical exam.  He has a history of type 2 diabetes mellitus.  His most recent lab work is listed below. Lab on 10/07/2023  Component Date Value Ref Range Status   WBC 10/07/2023 5.8  3.8 - 10.8 Thousand/uL Final   RBC 10/07/2023 4.23  4.20 - 5.80 Million/uL Final   Hemoglobin 10/07/2023 13.9  13.2 - 17.1 g/dL Final   HCT 29/52/8413 41.9  38.5 - 50.0 % Final   MCV 10/07/2023 99.1  80.0 - 100.0 fL Final   MCH 10/07/2023 32.9  27.0 - 33.0 pg Final   MCHC 10/07/2023 33.2  32.0 - 36.0 g/dL Final   Comment: For adults, a slight decrease in the calculated MCHC value (in the range of 30 to 32 g/dL) is most likely not clinically significant; however, it should be interpreted with caution in correlation with other red cell parameters and the patient's clinical condition.    RDW 10/07/2023 12.8  11.0 - 15.0 % Final   Platelets 10/07/2023 156  140 - 400 Thousand/uL Final   MPV 10/07/2023 9.1  7.5 - 12.5 fL Final   Neutro Abs 10/07/2023 2,767  1,500 - 7,800 cells/uL Final   Absolute Lymphocytes 10/07/2023 2,221  850 - 3,900 cells/uL Final   Absolute Monocytes 10/07/2023 592  200 - 950 cells/uL Final   Eosinophils Absolute 10/07/2023 180  15 - 500 cells/uL Final   Basophils Absolute 10/07/2023 41  0 - 200 cells/uL Final   Neutrophils Relative % 10/07/2023 47.7  % Final   Total Lymphocyte 10/07/2023 38.3  % Final   Monocytes Relative 10/07/2023 10.2  % Final   Eosinophils Relative 10/07/2023 3.1  % Final   Basophils Relative 10/07/2023 0.7  % Final   Glucose, Bld 10/07/2023 122 (H)  65 - 99 mg/dL Final   Comment: .            Fasting reference interval . For someone without known diabetes, a glucose value between 100 and 125 mg/dL is consistent with prediabetes and should be confirmed with a follow-up test. .    BUN 10/07/2023 15  7 -  25 mg/dL Final   Creat 24/40/1027 0.80  0.70 - 1.35 mg/dL Final   BUN/Creatinine Ratio 10/07/2023 SEE NOTE:  6 - 22 (calc) Final   Comment:    Not Reported: BUN and Creatinine are within    reference range. .    Sodium 10/07/2023 141  135 - 146 mmol/L Final   Potassium 10/07/2023 4.1  3.5 - 5.3 mmol/L Final   Chloride 10/07/2023 104  98 - 110 mmol/L Final   CO2 10/07/2023 27  20 - 32 mmol/L Final   Calcium  10/07/2023 9.3  8.6 - 10.3 mg/dL Final   Total Protein 25/36/6440 6.4  6.1 - 8.1 g/dL Final   Albumin 34/74/2595 4.4  3.6 - 5.1 g/dL Final   Globulin 63/87/5643 2.0  1.9 - 3.7 g/dL (calc) Final   AG Ratio 10/07/2023 2.2  1.0 - 2.5 (calc) Final   Total Bilirubin 10/07/2023 1.3 (H)  0.2 - 1.2 mg/dL Final   Alkaline phosphatase (APISO) 10/07/2023 66  35 - 144 U/L Final   AST 10/07/2023 18  10 - 35 U/L Final   ALT 10/07/2023 19  9 - 46 U/L Final   Hgb A1c MFr Bld 10/07/2023 6.9 (H)  <  5.7 % Final   Comment: For someone without known diabetes, a hemoglobin A1c value of 6.5% or greater indicates that they may have  diabetes and this should be confirmed with a follow-up  test. . For someone with known diabetes, a value <7% indicates  that their diabetes is well controlled and a value  greater than or equal to 7% indicates suboptimal  control. A1c targets should be individualized based on  duration of diabetes, age, comorbid conditions, and  other considerations. . Currently, no consensus exists regarding use of hemoglobin A1c for diagnosis of diabetes for children. .    Mean Plasma Glucose 10/07/2023 151  mg/dL Final   eAG (mmol/L) 09/81/1914 8.4  mmol/L Final   Cholesterol 10/07/2023 116  <200 mg/dL Final   HDL 78/29/5621 66  > OR = 40 mg/dL Final   Triglycerides 30/86/5784 48  <150 mg/dL Final   LDL Cholesterol (Calc) 10/07/2023 36  mg/dL (calc) Final   Comment: Reference range: <100 . Desirable range <100 mg/dL for primary prevention;   <70 mg/dL for patients with CHD or  diabetic patients  with > or = 2 CHD risk factors. Aaron Aas LDL-C is now calculated using the Martin-Hopkins  calculation, which is a validated novel method providing  better accuracy than the Friedewald equation in the  estimation of LDL-C.  Melinda Sprawls et al. Erroll Heard. 6962;952(84): 2061-2068  (http://education.[facility].com/faq/FAQ164)    Total CHOL/HDL Ratio 10/07/2023 1.8  <1.3 (calc) Final   Non-HDL Cholesterol (Calc) 10/07/2023 50  <130 mg/dL (calc) Final   Comment: For patients with diabetes plus 1 major ASCVD risk  factor, treating to a non-HDL-C goal of <100 mg/dL  (LDL-C of <24 mg/dL) is considered a therapeutic  option.    Hemoglobin A1c is acceptable at 6.9.  Total cholesterol is excellent at 116.  LDL cholesterol is excellent at 36.  HDL is very good at 66.  CMP and CBC were normal.  Patient is overdue for a PSA.  He had Cologuard performed in 2023.  This is due again next year.  Reviewing his shot records show that he is up-to-date with the pneumonia vaccine.  He states that he had a shingles vaccine at another facility.  His last tetanus shot documented here was 2015.  He states that he had a tetanus shot at another facility since that time. Immunization History  Administered Date(s) Administered   H1N1 03/20/2008   Influenza Whole 03/03/2006, 03/24/2007, 02/29/2008   Influenza, Seasonal, Injecte, Preservative Fre 03/23/2013   Influenza,inj,Quad PF,6+ Mos 03/22/2014, 03/30/2017, 01/30/2018   Influenza-Unspecified 03/16/2015, 02/12/2016, 04/12/2019   Moderna Sars-Covid-2 Vaccination 08/25/2019, 09/22/2019   Pneumococcal Polysaccharide-23 03/03/2006, 06/27/2021   Tdap 09/21/2013     Past Medical History:  Diagnosis Date   Allergy    allergic rhinitis   Diabetes mellitus    Elevated LFTs    Hyperlipidemia    Hypertension    Obesity    History reviewed. No pertinent surgical history. Current Outpatient Medications on File Prior to Visit  Medication Sig Dispense  Refill   aspirin  81 MG tablet Take 81 mg by mouth at bedtime.     atorvastatin  (LIPITOR) 20 MG tablet TAKE ONE TABLET BY MOUTH EVERY DAY 90 tablet 1   Beta Carotene (VITAMIN A) 25000 UNIT capsule Take 25,000 Units by mouth daily.     cetirizine  (ZYRTEC ) 10 MG tablet Take 1 tablet (10 mg total) by mouth daily. 30 tablet 0   cholecalciferol  (VITAMIN D) 1000 UNITS tablet Take 1,000 Units by  mouth at bedtime.     Cod Liver Oil 1000 MG CAPS Take 1 capsule by mouth daily.     fish oil-omega-3 fatty acids 1000 MG capsule Take 1 g by mouth 2 (two) times daily. 1 in am, 2 in pm     glucose blood test strip Check BS bid and prn  - Pt uses one touch ultra meter 100 each 12   ibuprofen  (ADVIL ) 600 MG tablet Take 1 tablet (600 mg total) by mouth every 6 (six) hours as needed. 30 tablet 0   JARDIANCE  25 MG TABS tablet Take 1 tablet (25 mg total) by mouth daily. 90 tablet 1   levocetirizine (XYZAL ) 5 MG tablet Take 1 tablet (5 mg total) by mouth every evening. 30 tablet 0   losartan  (COZAAR ) 50 MG tablet TAKE ONE TABLET BY MOUTH EVERY DAY 90 tablet 1   meloxicam  (MOBIC ) 15 MG tablet TAKE ONE TABLET BY MOUTH EVERY DAY 30 tablet 4   metFORMIN  (GLUCOPHAGE ) 1000 MG tablet Take 1 tablet by mouth twice daily 180 tablet 0   Multiple Vitamins-Minerals (MULTIVITAMIN PO) Take 1 tablet by mouth daily.     ondansetron  (ZOFRAN -ODT) 4 MG disintegrating tablet 4mg  ODT q4 hours prn nausea/vomit 10 tablet 0   oxyCODONE-acetaminophen  (PERCOCET) 10-325 MG tablet Take 1 tablet by mouth 4 (four) times daily as needed.     pioglitazone  (ACTOS ) 30 MG tablet TAKE ONE TABLET BY MOUTH EVERY DAY 90 tablet 1   pyridOXINE (VITAMIN B-6) 100 MG tablet Take 100 mg by mouth daily.     TURMERIC PO Take by mouth.     vitamin C (ASCORBIC ACID) 500 MG tablet Take 500 mg by mouth at bedtime.     azithromycin  (ZITHROMAX ) 250 MG tablet Take 1 tablet (250 mg total) by mouth daily. Take first 2 tablets together, then 1 every day until finished. 6  tablet 0   No current facility-administered medications on file prior to visit.    Allergies  Allergen Reactions   Hydrocodone  Nausea Only   Social History   Socioeconomic History   Marital status: Single    Spouse name: Not on file   Number of children: Not on file   Years of education: Not on file   Highest education level: 12th grade  Occupational History   Not on file  Tobacco Use   Smoking status: Never   Smokeless tobacco: Current    Types: Chew   Tobacco comments:    Chew tobacco  Vaping Use   Vaping status: Never Used  Substance and Sexual Activity   Alcohol use: No   Drug use: No    Comment: Used cocaine as young adult   Sexual activity: Not Currently    Birth control/protection: Condom  Other Topics Concern   Not on file  Social History Narrative   Not on file   Social Drivers of Health   Financial Resource Strain: Medium Risk (11/03/2023)   Overall Financial Resource Strain (CARDIA)    Difficulty of Paying Living Expenses: Somewhat hard  Food Insecurity: Food Insecurity Present (11/03/2023)   Hunger Vital Sign    Worried About Running Out of Food in the Last Year: Sometimes true    Ran Out of Food in the Last Year: Never true  Transportation Needs: No Transportation Needs (11/03/2023)   PRAPARE - Administrator, Civil Service (Medical): No    Lack of Transportation (Non-Medical): No  Physical Activity: Inactive (11/03/2023)   Exercise Vital Sign  Days of Exercise per Week: 0 days    Minutes of Exercise per Session: 30 min  Stress: No Stress Concern Present (11/03/2023)   [employer] of Occupational Health - Occupational Stress Questionnaire    Feeling of Stress : Only a little  Social Connections: Unknown (11/03/2023)   Social Connection and Isolation Panel    Frequency of Communication with Friends and Family: More than three times a week    Frequency of Social Gatherings with Friends and Family: Twice a week    Attends  Religious Services: More than 4 times per year    Active Member of [employer] or Organizations: No    Attends Engineer, structural: Not on file    Marital Status: Patient declined  Intimate Partner Violence: Unknown (07/04/2022)   Received from [facility]   HITS    Physically Hurt: Not on file    Insult or Talk Down To: Not on file    Threaten Physical Harm: Not on file    Scream or Curse: Not on file   Family History  Problem Relation Age of Onset   Healthy Mother    Hyperlipidemia Father    Hypertension Father    Heart disease Father    Alcohol abuse Father       Review of Systems  All other systems reviewed and are negative.      Objective:   Physical Exam Vitals reviewed.  Constitutional:      General: He is not in acute distress.    Appearance: Normal appearance. He is well-developed and normal weight. He is not ill-appearing, toxic-appearing or diaphoretic.  HENT:     Head: Normocephalic and atraumatic.     Right Ear: Tympanic membrane, ear canal and external ear normal.     Left Ear: Tympanic membrane, ear canal and external ear normal.     Nose: Nose normal.     Mouth/Throat:     Pharynx: No oropharyngeal exudate or posterior oropharyngeal erythema.   Eyes:     General: No scleral icterus.       Right eye: No discharge.        Left eye: No discharge.     Conjunctiva/sclera: Conjunctivae normal.     Pupils: Pupils are equal, round, and reactive to light.   Neck:     Thyroid: No thyromegaly.     Vascular: No carotid bruit or JVD.     Trachea: No tracheal deviation.   Cardiovascular:     Rate and Rhythm: Normal rate and regular rhythm.     Pulses: Normal pulses.     Heart sounds: Normal heart sounds. No murmur heard.    No friction rub. No gallop.  Pulmonary:     Effort: Pulmonary effort is normal. No respiratory distress.     Breath sounds: Normal breath sounds. No stridor. No wheezing or rales.  Abdominal:     General: Bowel sounds are  normal. There is no distension.     Palpations: Abdomen is soft. There is no mass.     Tenderness: There is no abdominal tenderness. There is no guarding or rebound.  Genitourinary:    Penis: No tenderness.    Musculoskeletal:     Cervical back: Normal range of motion and neck supple. No rigidity or tenderness.     Right lower leg: No edema.     Left lower leg: No edema.  Lymphadenopathy:     Cervical: No cervical adenopathy.   Skin:    General: Skin  is warm and dry.     Coloration: Skin is not jaundiced or pale.     Findings: No bruising, erythema, lesion or rash.   Neurological:     Mental Status: He is alert and oriented to person, place, and time.     Cranial Nerves: No cranial nerve deficit.     Sensory: No sensory deficit.     Motor: No weakness or abnormal muscle tone.     Coordination: Coordination normal.     Gait: Gait normal.     Deep Tendon Reflexes: Reflexes are normal and symmetric. Reflexes normal.   Psychiatric:        Behavior: Behavior normal.        Thought Content: Thought content normal.        Judgment: Judgment normal.           Assessment & Plan:  Colon cancer screening - Plan: CANCELED: Cologuard  Prostate cancer screening - Plan: PSA  Diabetes mellitus type 2 with complications (HCC)  Essential hypertension  General medical exam Patient's blood pressure is well-controlled.  It states that he is due for a colonoscopy however he had a negative Cologuard in 2023.  He is due to repeat this in 2026.  I will screen for prostate cancer with a PSA.  The patient has had the pneumonia vaccine within the last 5 years.  He is also had a tetanus shot as well as the Shingrix series although these were given to him at an outside facility.  Therefore his immunizations are up-to-date.  Hemoglobin A1c is acceptable at 6.9.  Cholesterol is outstanding.  Recommended diabetic eye exam however he prefers to schedule this himself.

## 2023-11-05 ENCOUNTER — Ambulatory Visit: Payer: Self-pay | Admitting: Family Medicine

## 2023-11-05 LAB — PSA: PSA: 0.39 ng/mL (ref <=4.00)

## 2023-11-10 LAB — HM DIABETES EYE EXAM

## 2023-11-15 NOTE — Progress Notes (Signed)
 Received results- No diabetic retinopathy shown. Routed exam to provider. Patient is aware of results and NO referral needed.

## 2023-12-12 ENCOUNTER — Other Ambulatory Visit: Payer: Self-pay | Admitting: Family Medicine

## 2023-12-12 DIAGNOSIS — E118 Type 2 diabetes mellitus with unspecified complications: Secondary | ICD-10-CM

## 2023-12-12 DIAGNOSIS — I10 Essential (primary) hypertension: Secondary | ICD-10-CM

## 2023-12-14 NOTE — Telephone Encounter (Signed)
 Requested Prescriptions  Pending Prescriptions Disp Refills   JARDIANCE  25 MG TABS tablet [Pharmacy Med Name: Jardiance  25 mg tablet] 90 tablet 1    Sig: Take 1 tablet (25 mg total) by mouth daily.     Endocrinology:  Diabetes - SGLT2 Inhibitors Failed - 12/14/2023 12:23 PM      Failed - eGFR in normal range and within 360 days    GFR, Est African American  Date Value Ref Range Status  06/28/2020 113 > OR = 60 mL/min/1.49m2 Final   GFR, Est Non African American  Date Value Ref Range Status  06/28/2020 97 > OR = 60 mL/min/1.74m2 Final   GFR, Estimated  Date Value Ref Range Status  07/04/2022 >60 >60 mL/min Final    Comment:    (NOTE) Calculated using the CKD-EPI Creatinine Equation (2021)    eGFR  Date Value Ref Range Status  06/27/2021 98 > OR = 60 mL/min/1.54m2 Final    Comment:    The eGFR is based on the CKD-EPI 2021 equation. To calculate  the new eGFR from a previous Creatinine or Cystatin C result, go to https://www.kidney.org/professionals/ kdoqi/gfr%5Fcalculator          Passed - Cr in normal range and within 360 days    Creat  Date Value Ref Range Status  10/07/2023 0.80 0.70 - 1.35 mg/dL Final   Creatinine, Urine  Date Value Ref Range Status  01/13/2023 72 20 - 320 mg/dL Final         Passed - HBA1C is between 0 and 7.9 and within 180 days    Hgb A1c MFr Bld  Date Value Ref Range Status  10/07/2023 6.9 (H) <5.7 % Final    Comment:    For someone without known diabetes, a hemoglobin A1c value of 6.5% or greater indicates that they may have  diabetes and this should be confirmed with a follow-up  test. . For someone with known diabetes, a value <7% indicates  that their diabetes is well controlled and a value  greater than or equal to 7% indicates suboptimal  control. A1c targets should be individualized based on  duration of diabetes, age, comorbid conditions, and  other considerations. . Currently, no consensus exists regarding use of hemoglobin  A1c for diagnosis of diabetes for children. SABRA Amy - Valid encounter within last 6 months    Recent Outpatient Visits           1 month ago Colon cancer screening   [facility] [facility] Pickard, Butler DASEN, MD   10 months ago Tenosynovitis, de Public relations account executive   [place] [facility] Kayla Jeoffrey RAMAN, FNP   11 months ago Tenosynovitis, de Public relations account executive   [facility] Pickard, Butler DASEN, MD   1 year ago Chronic pain of right wrist    [facility] Pickard, Butler DASEN, MD               atorvastatin  (LIPITOR) 20 MG tablet [Pharmacy Med Name: atorvastatin  20 mg tablet] 90 tablet 1    Sig: TAKE ONE TABLET BY MOUTH EVERY DAY     Cardiovascular:  Antilipid - Statins Failed - 12/14/2023 12:23 PM      Failed - Lipid Panel in normal range within the last 12 months    Cholesterol  Date Value Ref Range Status  10/07/2023 116 <200 mg/dL Final   LDL Cholesterol (Calc)  Date Value Ref Range Status  10/07/2023 36 mg/dL (calc) Final    Comment:    Reference range: <100 . Desirable range <100 mg/dL for primary prevention;   <70 mg/dL for patients with CHD or diabetic patients  with > or = 2 CHD risk factors. SABRA LDL-C is now calculated using the Martin-Hopkins  calculation, which is a validated novel method providing  better accuracy than the Friedewald equation in the  estimation of LDL-C.  Gladis APPLETHWAITE et al. SANDREA. 7986;689(80): 2061-2068  (http://education.[facility].com/faq/FAQ164)    HDL  Date Value Ref Range Status  10/07/2023 66 > OR = 40 mg/dL Final   Triglycerides  Date Value Ref Range Status  10/07/2023 48 <150 mg/dL Final         Passed - Patient is not pregnant      Passed - Valid encounter within last 12 months    Recent Outpatient Visits           1 month ago Colon cancer screening   [place] [facility] Pickard, Butler DASEN, MD   10 months ago  Tenosynovitis, de Public relations account executive   [facility] Kayla Jeoffrey RAMAN, FNP   11 months ago Tenosynovitis, de Public relations account executive   [place] [facility] Pickard, Butler DASEN, MD   1 year ago Chronic pain of right wrist   [place] [facility] Pickard, Butler DASEN, MD

## 2023-12-22 ENCOUNTER — Other Ambulatory Visit: Payer: Self-pay | Admitting: Family Medicine

## 2023-12-22 DIAGNOSIS — I10 Essential (primary) hypertension: Secondary | ICD-10-CM

## 2023-12-22 DIAGNOSIS — E118 Type 2 diabetes mellitus with unspecified complications: Secondary | ICD-10-CM

## 2024-01-08 ENCOUNTER — Other Ambulatory Visit: Payer: Self-pay | Admitting: Family Medicine

## 2024-01-11 ENCOUNTER — Other Ambulatory Visit: Payer: Self-pay | Admitting: Family Medicine

## 2024-01-11 NOTE — Telephone Encounter (Signed)
 Labs in date   LOV 11/04/2023  Requested Prescriptions  Pending Prescriptions Disp Refills   metFORMIN  (GLUCOPHAGE ) 1000 MG tablet [Pharmacy Med Name: metFORMIN  HCl 1000 MG Oral Tablet] 180 tablet 0    Sig: Take 1 tablet by mouth twice daily     Endocrinology:  Diabetes - Biguanides Failed - 01/11/2024 12:51 PM      Failed - eGFR in normal range and within 360 days    GFR, Est African American  Date Value Ref Range Status  06/28/2020 113 > OR = 60 mL/min/1.39m2 Final   GFR, Est Non African American  Date Value Ref Range Status  06/28/2020 97 > OR = 60 mL/min/1.87m2 Final   GFR, Estimated  Date Value Ref Range Status  07/04/2022 >60 >60 mL/min Final    Comment:    (NOTE) Calculated using the CKD-EPI Creatinine Equation (2021)    eGFR  Date Value Ref Range Status  06/27/2021 98 > OR = 60 mL/min/1.37m2 Final    Comment:    The eGFR is based on the CKD-EPI 2021 equation. To calculate  the new eGFR from a previous Creatinine or Cystatin C result, go to https://www.kidney.org/professionals/ kdoqi/gfr%5Fcalculator          Failed - B12 Level in normal range and within 720 days    No results found for: VITAMINB12       Passed - Cr in normal range and within 360 days    Creat  Date Value Ref Range Status  10/07/2023 0.80 0.70 - 1.35 mg/dL Final   Creatinine, Urine  Date Value Ref Range Status  01/13/2023 72 20 - 320 mg/dL Final         Passed - HBA1C is between 0 and 7.9 and within 180 days    Hgb A1c MFr Bld  Date Value Ref Range Status  10/07/2023 6.9 (H) <5.7 % Final    Comment:    For someone without known diabetes, a hemoglobin A1c value of 6.5% or greater indicates that they may have  diabetes and this should be confirmed with a follow-up  test. . For someone with known diabetes, a value <7% indicates  that their diabetes is well controlled and a value  greater than or equal to 7% indicates suboptimal  control. A1c targets should be individualized based on   duration of diabetes, age, comorbid conditions, and  other considerations. . Currently, no consensus exists regarding use of hemoglobin A1c for diagnosis of diabetes for children. SABRA Amy - Valid encounter within last 6 months    Recent Outpatient Visits           2 months ago Colon cancer screening   [place] [facility] Pickard, Butler DASEN, MD   11 months ago Tenosynovitis, de Public relations account executive   [place] [facility] Kayla Jeoffrey RAMAN, FNP   1 year ago Tenosynovitis, de [place]   [place] [facility] Pickard, Butler DASEN, MD   1 year ago Chronic pain of right wrist   [place] [facility] Duanne Butler DASEN, MD              Passed - CBC within normal limits and completed in the last 12 months    WBC  Date Value Ref Range Status  10/07/2023 5.8 3.8 - 10.8 Thousand/uL Final   RBC  Date Value Ref Range Status  10/07/2023 4.23 4.20 - 5.80 Million/uL Final  Hemoglobin  Date Value Ref Range Status  10/07/2023 13.9 13.2 - 17.1 g/dL Final   HCT  Date Value Ref Range Status  10/07/2023 41.9 38.5 - 50.0 % Final   MCHC  Date Value Ref Range Status  10/07/2023 33.2 32.0 - 36.0 g/dL Final    Comment:    For adults, a slight decrease in the calculated MCHC value (in the range of 30 to 32 g/dL) is most likely not clinically significant; however, it should be interpreted with caution in correlation with other red cell parameters and the patient's clinical condition.    [facility]  Date Value Ref Range Status  10/07/2023 32.9 27.0 - 33.0 pg Final   MCV  Date Value Ref Range Status  10/07/2023 99.1 80.0 - 100.0 fL Final   No results found for: PLTCOUNTKUC, LABPLAT, POCPLA RDW  Date Value Ref Range Status  10/07/2023 12.8 11.0 - 15.0 % Final

## 2024-01-12 NOTE — Telephone Encounter (Signed)
 Requested Prescriptions  Pending Prescriptions Disp Refills   meloxicam  (MOBIC ) 15 MG tablet [Pharmacy Med Name: meloxicam  15 mg tablet] 30 tablet 4    Sig: TAKE ONE TABLET BY MOUTH EVERY DAY     Analgesics:  COX2 Inhibitors Failed - 01/12/2024  3:31 PM      Failed - Manual Review: Labs are only required if the patient has taken medication for more than 8 weeks.      Failed - eGFR is 30 or above and within 360 days    GFR, Est African American  Date Value Ref Range Status  06/28/2020 113 > OR = 60 mL/min/1.63m2 Final   GFR, Est Non African American  Date Value Ref Range Status  06/28/2020 97 > OR = 60 mL/min/1.72m2 Final   GFR, Estimated  Date Value Ref Range Status  07/04/2022 >60 >60 mL/min Final    Comment:    (NOTE) Calculated using the CKD-EPI Creatinine Equation (2021)    eGFR  Date Value Ref Range Status  06/27/2021 98 > OR = 60 mL/min/1.57m2 Final    Comment:    The eGFR is based on the CKD-EPI 2021 equation. To calculate  the new eGFR from a previous Creatinine or Cystatin C result, go to https://www.kidney.org/professionals/ kdoqi/gfr%5Fcalculator          Passed - HGB in normal range and within 360 days    Hemoglobin  Date Value Ref Range Status  10/07/2023 13.9 13.2 - 17.1 g/dL Final         Passed - Cr in normal range and within 360 days    Creat  Date Value Ref Range Status  10/07/2023 0.80 0.70 - 1.35 mg/dL Final   Creatinine, Urine  Date Value Ref Range Status  01/13/2023 72 20 - 320 mg/dL Final         Passed - HCT in normal range and within 360 days    HCT  Date Value Ref Range Status  10/07/2023 41.9 38.5 - 50.0 % Final         Passed - AST in normal range and within 360 days    AST  Date Value Ref Range Status  10/07/2023 18 10 - 35 U/L Final         Passed - ALT in normal range and within 360 days    ALT  Date Value Ref Range Status  10/07/2023 19 9 - 46 U/L Final         Passed - Patient is not pregnant      Passed - Valid  encounter within last 12 months    Recent Outpatient Visits           2 months ago Colon cancer screening   [place] [facility] Pickard, Butler DASEN, MD   11 months ago Tenosynovitis, de Public relations account executive   [facility] Kayla Jeoffrey RAMAN, FNP   1 year ago Tenosynovitis, de [place]   [place] [facility] Pickard, Butler DASEN, MD   1 year ago Chronic pain of right wrist    [facility] Pickard, Butler DASEN, MD

## 2024-04-06 ENCOUNTER — Ambulatory Visit: Admitting: Family Medicine

## 2024-04-06 ENCOUNTER — Encounter: Payer: Self-pay | Admitting: Family Medicine

## 2024-04-06 VITALS — BP 138/72 | HR 89 | Temp 98.0°F | Ht 67.0 in | Wt 175.6 lb

## 2024-04-06 DIAGNOSIS — F900 Attention-deficit hyperactivity disorder, predominantly inattentive type: Secondary | ICD-10-CM

## 2024-04-06 DIAGNOSIS — Z599 Problem related to housing and economic circumstances, unspecified: Secondary | ICD-10-CM | POA: Diagnosis not present

## 2024-04-06 MED ORDER — LISDEXAMFETAMINE DIMESYLATE 30 MG PO CAPS: 30.0000 mg | ORAL_CAPSULE | Freq: Every day | ORAL | 0 refills | Status: DC

## 2024-04-06 NOTE — Progress Notes (Signed)
 Subjective:    Patient ID: Kyle Ray, male    DOB: 1960/11/23, 63 y.o.   MRN: [id number]  Patient is a pleasant 63 year old Caucasian gentleman who presents today for evaluation for ADD.  He states that his family has a history of ADD.  His sister has ADD.  He believes he has dealt with this for years.  He states that he made average grades in school but he had a difficult time focusing in class.  He states that he would often daydream during class and have a difficult time retaining material.  He states that his friends would often accused him of not listening to them when they were talking to him.  At work, he occasionally has a difficult time remembering what his boss has told him to do because he is not paying attention.  He finds himself easily distracted.  He denies any hyperactivity or impulsivity.  He denies any depression.  He is under more stress.  His home does not have heat right now.  Recently he has been working try to renovate his home.  However he finds himself starting projects and not finishing them.  He finds himself beginning 1 job, becoming distracted and starting something else never finishing either task.  His sister was working with him on his home and stated that he would jump from task to task to task easily distracted and not focusing or completing his work.  This is causing a problem for him because it is causing him not to finish the renovations on his home. Past Medical History:  Diagnosis Date   Allergy    allergic rhinitis   Diabetes mellitus    Elevated LFTs    Hyperlipidemia    Hypertension    Obesity    No past surgical history on file. Current Outpatient Medications on File Prior to Visit  Medication Sig Dispense Refill   aspirin  81 MG tablet Take 81 mg by mouth at bedtime.     atorvastatin  (LIPITOR) 20 MG tablet TAKE ONE TABLET BY MOUTH EVERY DAY 90 tablet 1   azithromycin  (ZITHROMAX ) 250 MG tablet Take 1 tablet (250 mg total) by mouth daily. Take  first 2 tablets together, then 1 every day until finished. 6 tablet 0   Beta Carotene (VITAMIN A) 25000 UNIT capsule Take 25,000 Units by mouth daily.     cetirizine  (ZYRTEC ) 10 MG tablet Take 1 tablet (10 mg total) by mouth daily. 30 tablet 0   cholecalciferol  (VITAMIN D) 1000 UNITS tablet Take 1,000 Units by mouth at bedtime.     Cod Liver Oil 1000 MG CAPS Take 1 capsule by mouth daily.     fish oil-omega-3 fatty acids 1000 MG capsule Take 1 g by mouth 2 (two) times daily. 1 in am, 2 in pm     glucose blood test strip Check BS bid and prn  - Pt uses one touch ultra meter 100 each 12   ibuprofen  (ADVIL ) 600 MG tablet Take 1 tablet (600 mg total) by mouth every 6 (six) hours as needed. 30 tablet 0   JARDIANCE  25 MG TABS tablet Take 1 tablet (25 mg total) by mouth daily. 90 tablet 1   levocetirizine (XYZAL ) 5 MG tablet Take 1 tablet (5 mg total) by mouth every evening. 30 tablet 0   losartan  (COZAAR ) 50 MG tablet TAKE ONE TABLET BY MOUTH EVERY DAY 90 tablet 1   meloxicam  (MOBIC ) 15 MG tablet TAKE ONE TABLET BY MOUTH EVERY DAY 30  tablet 4   metFORMIN  (GLUCOPHAGE ) 1000 MG tablet Take 1 tablet by mouth twice daily 180 tablet 0   Multiple Vitamins-Minerals (MULTIVITAMIN PO) Take 1 tablet by mouth daily.     ondansetron  (ZOFRAN -ODT) 4 MG disintegrating tablet 4mg  ODT q4 hours prn nausea/vomit 10 tablet 0   oxyCODONE-acetaminophen  (PERCOCET) 10-325 MG tablet Take 1 tablet by mouth 4 (four) times daily as needed.     pioglitazone  (ACTOS ) 30 MG tablet TAKE ONE TABLET BY MOUTH EVERY DAY 90 tablet 1   pyridOXINE (VITAMIN B-6) 100 MG tablet Take 100 mg by mouth daily.     TURMERIC PO Take by mouth.     vitamin C (ASCORBIC ACID) 500 MG tablet Take 500 mg by mouth at bedtime.     No current facility-administered medications on file prior to visit.    Allergies  Allergen Reactions   Hydrocodone  Nausea Only   Social History   Socioeconomic History   Marital status: Single    Spouse name: Not on file    Number of children: Not on file   Years of education: Not on file   Highest education level: 12th grade  Occupational History   Not on file  Tobacco Use   Smoking status: Never   Smokeless tobacco: Current    Types: Chew   Tobacco comments:    Chew tobacco  Vaping Use   Vaping status: Never Used  Substance and Sexual Activity   Alcohol use: No   Drug use: No    Comment: Used cocaine as young adult   Sexual activity: Not Currently    Birth control/protection: Condom  Other Topics Concern   Not on file  Social History Narrative   Not on file   Social Drivers of Health   Financial Resource Strain: Medium Risk (04/05/2024)   Overall Financial Resource Strain (CARDIA)    Difficulty of Paying Living Expenses: Somewhat hard  Food Insecurity: Food Insecurity Present (04/05/2024)   Hunger Vital Sign    Worried About Running Out of Food in the Last Year: Sometimes true    Ran Out of Food in the Last Year: Never true  Transportation Needs: No Transportation Needs (04/05/2024)   PRAPARE - Administrator, Civil Service (Medical): No    Lack of Transportation (Non-Medical): No  Physical Activity: Inactive (04/05/2024)   Exercise Vital Sign    Days of Exercise per Week: 0 days    Minutes of Exercise per Session: Not on file  Stress: No Stress Concern Present (04/05/2024)   [employer] of Occupational Health - Occupational Stress Questionnaire    Feeling of Stress: Only a little  Social Connections: Unknown (04/05/2024)   Social Connection and Isolation Panel    Frequency of Communication with Friends and Family: More than three times a week    Frequency of Social Gatherings with Friends and Family: Three times a week    Attends Religious Services: More than 4 times per year    Active Member of Clubs or Organizations: No    Attends Banker Meetings: Not on file    Marital Status: Patient declined  Intimate Partner Violence: Unknown (07/04/2022)    Received from [facility]   HITS    Physically Hurt: Not on file    Insult or Talk Down To: Not on file    Threaten Physical Harm: Not on file    Scream or Curse: Not on file   Family History  Problem Relation Age of Onset  Healthy Mother    Hyperlipidemia Father    Hypertension Father    Heart disease Father    Alcohol abuse Father       Review of Systems  All other systems reviewed and are negative.      Objective:   Physical Exam Vitals reviewed.  Constitutional:      General: He is not in acute distress.    Appearance: He is well-developed. He is not diaphoretic.  HENT:     Head: Normocephalic and atraumatic.  Neck:     Thyroid: No thyromegaly.     Vascular: No JVD.     Trachea: No tracheal deviation.  Cardiovascular:     Rate and Rhythm: Normal rate and regular rhythm.     Heart sounds: Normal heart sounds.  Pulmonary:     Effort: Pulmonary effort is normal. No respiratory distress.     Breath sounds: Normal breath sounds. No wheezing, rhonchi or rales.  Neurological:     General: No focal deficit present.     Mental Status: He is alert and oriented to person, place, and time. Mental status is at baseline.     Cranial Nerves: No cranial nerve deficit.     Motor: No weakness.     Coordination: Coordination normal.     Gait: Gait normal.  Psychiatric:        Mood and Affect: Mood normal.        Behavior: Behavior normal.        Thought Content: Thought content normal.        Judgment: Judgment normal.           Assessment & Plan:  Financial difficulties - Plan: AMB Referral [facility]BCI Care Management  Attention deficit hyperactivity disorder (ADHD), predominantly inattentive type Certainly sounds like the patient has a difficult time maintaining focus.  Now it is causing him a problem in his life and that he is not able to complete jobs he is starting and is causing him to fall behind on his home renovation.  He certainly has a family history of ADD.   He states that he has dealt with this for years.  He gives the example of helping his stepfather cut down trees.  His father sent him to the house to get something for the saw.  He became distracted and did not return for 30 minutes because he was working on something else.  When he came back, his father was exacerbated by him.  Therefore this has been an issue for him for years.  We will try him on Vyvanse 30 mg a day.  He will monitor for insomnia or tachycardia and he will check his blood pressure.  Reassess in 1 month or sooner if worse

## 2024-04-11 ENCOUNTER — Other Ambulatory Visit: Payer: Self-pay | Admitting: Family Medicine

## 2024-04-13 NOTE — Telephone Encounter (Signed)
 Requested Prescriptions  Pending Prescriptions Disp Refills   metFORMIN  (GLUCOPHAGE ) 1000 MG tablet [Pharmacy Med Name: metFORMIN  HCl 1000 MG Oral Tablet] 180 tablet 0    Sig: Take 1 tablet by mouth twice daily     Endocrinology:  Diabetes - Biguanides Failed - 04/13/2024  4:24 PM      Failed - HBA1C is between 0 and 7.9 and within 180 days    Hgb A1c MFr Bld  Date Value Ref Range Status  10/07/2023 6.9 (H) <5.7 % Final    Comment:    For someone without known diabetes, a hemoglobin A1c value of 6.5% or greater indicates that they may have  diabetes and this should be confirmed with a follow-up  test. . For someone with known diabetes, a value <7% indicates  that their diabetes is well controlled and a value  greater than or equal to 7% indicates suboptimal  control. A1c targets should be individualized based on  duration of diabetes, age, comorbid conditions, and  other considerations. . Currently, no consensus exists regarding use of hemoglobin A1c for diagnosis of diabetes for children. .          Failed - eGFR in normal range and within 360 days    GFR, Est African American  Date Value Ref Range Status  06/28/2020 113 > OR = 60 mL/min/1.52m2 Final   GFR, Est Non African American  Date Value Ref Range Status  06/28/2020 97 > OR = 60 mL/min/1.39m2 Final   GFR, Estimated  Date Value Ref Range Status  07/04/2022 >60 >60 mL/min Final    Comment:    (NOTE) Calculated using the CKD-EPI Creatinine Equation (2021)    eGFR  Date Value Ref Range Status  06/27/2021 98 > OR = 60 mL/min/1.45m2 Final    Comment:    The eGFR is based on the CKD-EPI 2021 equation. To calculate  the new eGFR from a previous Creatinine or Cystatin C result, go to https://www.kidney.org/professionals/ kdoqi/gfr%5Fcalculator          Failed - B12 Level in normal range and within 720 days    No results found for: VITAMINB12       Passed - Cr in normal range and within 360 days    Creat   Date Value Ref Range Status  10/07/2023 0.80 0.70 - 1.35 mg/dL Final   Creatinine, Urine  Date Value Ref Range Status  01/13/2023 72 20 - 320 mg/dL Final         Passed - Valid encounter within last 6 months    Recent Outpatient Visits           1 week ago Financial difficulties   [facility] Pickard, Butler DASEN, MD   5 months ago Colon cancer screening   [facility] Pickard, Butler DASEN, MD   1 year ago Tenosynovitis, de Public Relations Account Executive   [place] [facility] Kayla Jeoffrey RAMAN, FNP   1 year ago Tenosynovitis, de [place]    [facility] Pickard, Butler DASEN, MD   1 year ago Chronic pain of right wrist    [facility] Duanne Butler DASEN, MD              Passed - CBC within normal limits and completed in the last 12 months    WBC  Date Value Ref Range Status  10/07/2023 5.8 3.8 - 10.8 Thousand/uL Final   RBC  Date  Value Ref Range Status  10/07/2023 4.23 4.20 - 5.80 Million/uL Final   Hemoglobin  Date Value Ref Range Status  10/07/2023 13.9 13.2 - 17.1 g/dL Final   HCT  Date Value Ref Range Status  10/07/2023 41.9 38.5 - 50.0 % Final   MCHC  Date Value Ref Range Status  10/07/2023 33.2 32.0 - 36.0 g/dL Final    Comment:    For adults, a slight decrease in the calculated MCHC value (in the range of 30 to 32 g/dL) is most likely not clinically significant; however, it should be interpreted with caution in correlation with other red cell parameters and the patient's clinical condition.    [facility]  Date Value Ref Range Status  10/07/2023 32.9 27.0 - 33.0 pg Final   MCV  Date Value Ref Range Status  10/07/2023 99.1 80.0 - 100.0 fL Final   No results found for: PLTCOUNTKUC, LABPLAT, POCPLA RDW  Date Value Ref Range Status  10/07/2023 12.8 11.0 - 15.0 % Final

## 2024-05-09 ENCOUNTER — Other Ambulatory Visit: Payer: Self-pay | Admitting: Family Medicine
